# Patient Record
Sex: Female | Born: 1965
Health system: Southern US, Community
[De-identification: ages and names within clinical notes are randomized; demographics above are authoritative.]

## PROBLEM LIST (undated history)

## (undated) DIAGNOSIS — G4730 Sleep apnea, unspecified: Secondary | ICD-10-CM

## (undated) DIAGNOSIS — U099 Post covid-19 condition, unspecified: Secondary | ICD-10-CM

## (undated) DIAGNOSIS — J302 Other seasonal allergic rhinitis: Secondary | ICD-10-CM

## (undated) DIAGNOSIS — R252 Cramp and spasm: Secondary | ICD-10-CM

## (undated) HISTORY — PX: CHOLECYSTECTOMY: SHX55

## (undated) HISTORY — DX: Other seasonal allergic rhinitis: J30.2

## (undated) HISTORY — DX: Cramp and spasm: R25.2

## (undated) HISTORY — DX: Sleep apnea, unspecified: G47.30

## (undated) HISTORY — DX: Post covid-19 condition, unspecified: U09.9

---

## 1997-11-01 ENCOUNTER — Other Ambulatory Visit: Admission: RE | Admit: 1997-11-01 | Discharge: 1997-11-01 | Payer: Self-pay | Admitting: Obstetrics and Gynecology

## 1998-10-07 ENCOUNTER — Other Ambulatory Visit: Admission: RE | Admit: 1998-10-07 | Discharge: 1998-10-07 | Payer: Self-pay | Admitting: Obstetrics and Gynecology

## 2001-08-11 ENCOUNTER — Encounter: Admission: RE | Admit: 2001-08-11 | Discharge: 2001-08-11 | Payer: Self-pay | Admitting: Obstetrics and Gynecology

## 2001-08-11 ENCOUNTER — Encounter: Payer: Self-pay | Admitting: Obstetrics and Gynecology

## 2001-09-08 ENCOUNTER — Encounter: Payer: Self-pay | Admitting: Obstetrics and Gynecology

## 2001-09-08 ENCOUNTER — Encounter: Admission: RE | Admit: 2001-09-08 | Discharge: 2001-09-08 | Payer: Self-pay | Admitting: Obstetrics and Gynecology

## 2001-11-08 ENCOUNTER — Encounter: Admission: RE | Admit: 2001-11-08 | Discharge: 2001-11-08 | Payer: Self-pay | Admitting: Obstetrics and Gynecology

## 2001-11-08 ENCOUNTER — Encounter: Payer: Self-pay | Admitting: Obstetrics and Gynecology

## 2002-08-10 ENCOUNTER — Other Ambulatory Visit: Admission: RE | Admit: 2002-08-10 | Discharge: 2002-08-10 | Payer: Self-pay | Admitting: Obstetrics and Gynecology

## 2003-08-14 ENCOUNTER — Other Ambulatory Visit: Admission: RE | Admit: 2003-08-14 | Discharge: 2003-08-14 | Payer: Self-pay | Admitting: Obstetrics and Gynecology

## 2004-12-19 ENCOUNTER — Ambulatory Visit (HOSPITAL_COMMUNITY): Admission: RE | Admit: 2004-12-19 | Discharge: 2004-12-19 | Payer: Self-pay | Admitting: Family Medicine

## 2004-12-30 ENCOUNTER — Observation Stay (HOSPITAL_COMMUNITY): Admission: RE | Admit: 2004-12-30 | Discharge: 2004-12-31 | Payer: Self-pay | Admitting: General Surgery

## 2005-03-09 ENCOUNTER — Other Ambulatory Visit: Admission: RE | Admit: 2005-03-09 | Discharge: 2005-03-09 | Payer: Self-pay | Admitting: Obstetrics and Gynecology

## 2005-05-08 ENCOUNTER — Ambulatory Visit (HOSPITAL_COMMUNITY): Admission: RE | Admit: 2005-05-08 | Discharge: 2005-05-08 | Payer: Self-pay | Admitting: Endocrinology

## 2006-11-11 ENCOUNTER — Other Ambulatory Visit: Admission: RE | Admit: 2006-11-11 | Discharge: 2006-11-11 | Payer: Self-pay | Admitting: Obstetrics and Gynecology

## 2007-05-23 ENCOUNTER — Encounter: Admission: RE | Admit: 2007-05-23 | Discharge: 2007-05-23 | Payer: Self-pay | Admitting: Endocrinology

## 2007-06-20 ENCOUNTER — Encounter: Admission: RE | Admit: 2007-06-20 | Discharge: 2007-06-20 | Payer: Self-pay | Admitting: Obstetrics and Gynecology

## 2007-11-18 ENCOUNTER — Other Ambulatory Visit: Admission: RE | Admit: 2007-11-18 | Discharge: 2007-11-18 | Payer: Self-pay | Admitting: Obstetrics and Gynecology

## 2008-06-27 ENCOUNTER — Encounter: Admission: RE | Admit: 2008-06-27 | Discharge: 2008-06-27 | Payer: Self-pay | Admitting: Obstetrics and Gynecology

## 2008-11-23 ENCOUNTER — Other Ambulatory Visit: Admission: RE | Admit: 2008-11-23 | Discharge: 2008-11-23 | Payer: Self-pay | Admitting: Obstetrics and Gynecology

## 2009-01-02 ENCOUNTER — Ambulatory Visit (HOSPITAL_COMMUNITY): Admission: RE | Admit: 2009-01-02 | Discharge: 2009-01-02 | Payer: Self-pay | Admitting: Endocrinology

## 2009-03-13 ENCOUNTER — Ambulatory Visit (HOSPITAL_COMMUNITY): Admission: RE | Admit: 2009-03-13 | Discharge: 2009-03-13 | Payer: Self-pay | Admitting: Gastroenterology

## 2009-07-03 ENCOUNTER — Encounter: Admission: RE | Admit: 2009-07-03 | Discharge: 2009-07-03 | Payer: Self-pay | Admitting: Obstetrics and Gynecology

## 2009-07-10 ENCOUNTER — Encounter: Admission: RE | Admit: 2009-07-10 | Discharge: 2009-07-10 | Payer: Self-pay | Admitting: Obstetrics and Gynecology

## 2009-12-24 ENCOUNTER — Other Ambulatory Visit: Admission: RE | Admit: 2009-12-24 | Discharge: 2009-12-24 | Payer: Self-pay | Admitting: Obstetrics and Gynecology

## 2010-01-10 ENCOUNTER — Encounter: Admission: RE | Admit: 2010-01-10 | Discharge: 2010-01-10 | Payer: Self-pay | Admitting: Obstetrics and Gynecology

## 2010-07-04 ENCOUNTER — Encounter
Admission: RE | Admit: 2010-07-04 | Discharge: 2010-07-04 | Payer: Self-pay | Source: Home / Self Care | Attending: Obstetrics and Gynecology | Admitting: Obstetrics and Gynecology

## 2010-08-11 ENCOUNTER — Encounter: Payer: Self-pay | Admitting: Endocrinology

## 2010-12-02 NOTE — Op Note (Signed)
NAMETOLULOPE, PINKETT             ACCOUNT NO.:  [id number]   MEDICAL RECORD NO.:  [id number]          PATIENT TYPE:  AMB   LOCATION:  ENDO                         FACILITY:  [facility]   PHYSICIAN:  James L. Malon Kindle., M.D.DATE OF BIRTH:  08-Oct-1965   DATE OF PROCEDURE:  03/13/2009  DATE OF DISCHARGE:                               OPERATIVE REPORT   PROCEDURE:  Colonoscopy.   MEDICATIONS:  Fentanyl 100 mcg, Versed 8 mg IV.   INDICATIONS:  Rectal bleeding.   DESCRIPTION OF PROCEDURE:  Procedure has been explained to the patient  and consent was obtained.  In the left lateral decubitus position a  Pentax pediatric scope was inserted and advanced.  The prep was  excellent.  We were able to reach the cecum without difficulty with the  ileocecal valve and appendiceal orifice were seen.  The scope was  withdrawn and the descending colon, transverse, descending, and sigmoid  colon were seen well.  No polyps, diverticula or any other lesions were  seen.  The scope was withdrawn in the rectum and on the retroflex view  the patient was seen to have internal hemorrhoids.  No other  abnormalities were seen in the rectum.  The scope was withdrawn.  The  patient tolerated the procedure well.   ASSESSMENT:  Rectal bleeding probably due to internal hemorrhoids.  No  other findings were seen.  I gave the patient hemorrhoid instructions  and recommend routine screening followup.  Colonoscopy will be repeated  10 years.  Will give hemorrhoid instructions.           ______________________________  Llana Aliment Malon Kindle., M.D.     Waldron Session  D:  03/13/2009  T:  03/13/2009  Job:  [id number]   cc:   Artist Pais, M.D.  Fax: [phone]

## 2010-12-05 NOTE — Op Note (Signed)
[name], [name]             ACCOUNT NO.:  [id number]   MEDICAL RECORD NO.:  [id number]          PATIENT TYPE:  AMB   LOCATION:  DAY                           FACILITY:  [facility]   PHYSICIAN:  Jerolyn Shin C. Katrinka Blazing, M.D.   DATE OF BIRTH:  09-Mar-1966   DATE OF PROCEDURE:  12/30/2004  DATE OF DISCHARGE:                                 OPERATIVE REPORT   PREOPERATIVE DIAGNOSIS:  Cholelithiasis, cholecystitis.   POSTOPERATIVE DIAGNOSIS:  Cholelithiasis, cholecystitis.   PROCEDURE:  Laparoscopic cholecystectomy.   SURGEON:  Dr. Katrinka Blazing.   DESCRIPTION:  Under general endotracheal anesthesia, the patient's abdomen  was prepped and draped in a sterile field.  A supraumbilical incision was  made, and Veress needle was inserted uneventfully. Abdomen was insufflated  with 2 liters of CO2. Using a Visiport guide, a 10-mm port was placed.  Laparoscope was placed. 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 Under videoscopic  guidance, a 10-mm port and two 5-mm ports were placed in the right costal  area. The gallbladder was grasped and positioned. Cystic duct was dissected,  clipped with 5 clips and divided. The cystic artery 2 small branches. Each  was clipped with 3 clips and divided closed to the gallbladder. Gallbladder  was separated from the intrahepatic bed using electrocautery. The  gallbladder was placed in an EndoCatch device and retrieved. Irrigation was  carried out. There was minimal bleeding from the bed. The irrigating fluid  returned clear. CO2 was allowed to escape from the abdomen, and the ports  were removed. The incisions were closed using 0 Dexon on the fascia and  staples on the skin. The patient was awakened from anesthesia uneventfully,  transferred to a bed, and taken to the post anesthetic care unit for further  monitoring.       LCS/MEDQ  D:  12/30/2004  T:  12/30/2004  Job:  [id number]

## 2010-12-05 NOTE — H&P (Signed)
NAMEKATORA, FINI             ACCOUNT NO.:  [id number]   MEDICAL RECORD NO.:  [id number]          PATIENT TYPE:  AMB   LOCATION:  DAY                           FACILITY:  [facility]   PHYSICIAN:  Jerolyn Shin C. Katrinka Blazing, M.D.   DATE OF BIRTH:  November 09, 1965   DATE OF ADMISSION:  DATE OF DISCHARGE:  LH                                HISTORY & PHYSICAL   A 45 year old female with a history of pain in the right upper quadrant  radiating through to her back.  She has no difficulty with meals.  She  denies nausea or vomiting.  She has not had diarrhea.  Gallbladder  ultrasound revealed a thickened gallbladder with multiple small stones.  Patient is severely symptomatic and is scheduled for cholecystectomy.   PAST MEDICAL HISTORY:  She has no major medical illnesses.  Takes no  medications.  The only surgery is a C-section.   She has no allergies.   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  She  does not drink, smoke, or use drugs.  She is married.   PHYSICAL EXAMINATION:  VITAL SIGNS:  Blood pressure 116/78, pulse 72,  respirations 20, weight 147 pounds.  HEENT:  Unremarkable.  NECK:  Supple.  No JVD, bruits, adenopathy, or thyromegaly.  LUNGS:  Clear to auscultation.  HEART:  Regular rate and rhythm without murmur, rub or gallop.  ABDOMEN:  Mild right upper quadrant tenderness and epigastric tenderness.  EXTREMITIES:  No clubbing, cyanosis or edema.  NEUROLOGIC:  No focal motor, sensory, or cerebellar deficit.   IMPRESSION:  Cholelithiasis with cholecystitis.   PLAN:  Cholecystectomy.       LCS/MEDQ  D:  12/29/2004  T:  12/29/2004  Job:  [id number]

## 2011-06-15 ENCOUNTER — Other Ambulatory Visit: Payer: Self-pay | Admitting: Obstetrics and Gynecology

## 2011-06-15 DIAGNOSIS — Z1231 Encounter for screening mammogram for malignant neoplasm of breast: Secondary | ICD-10-CM

## 2011-07-02 ENCOUNTER — Other Ambulatory Visit (HOSPITAL_COMMUNITY)
Admission: RE | Admit: 2011-07-02 | Discharge: 2011-07-02 | Disposition: A | Discharge status: Home / Self Care | Payer: 59 | Source: Ambulatory Visit | Attending: Obstetrics and Gynecology | Admitting: Obstetrics and Gynecology

## 2011-07-02 ENCOUNTER — Other Ambulatory Visit: Payer: Self-pay | Admitting: Obstetrics and Gynecology

## 2011-07-02 DIAGNOSIS — Z01419 Encounter for gynecological examination (general) (routine) without abnormal findings: Secondary | ICD-10-CM | POA: Insufficient documentation

## 2011-07-16 ENCOUNTER — Ambulatory Visit
Admission: RE | Admit: 2011-07-16 | Discharge: 2011-07-16 | Disposition: A | Discharge status: Home / Self Care | Payer: 59 | Source: Ambulatory Visit | Attending: Obstetrics and Gynecology | Admitting: Obstetrics and Gynecology

## 2011-07-16 DIAGNOSIS — Z1231 Encounter for screening mammogram for malignant neoplasm of breast: Secondary | ICD-10-CM

## 2012-06-03 ENCOUNTER — Other Ambulatory Visit: Payer: Self-pay | Admitting: Obstetrics and Gynecology

## 2012-06-03 DIAGNOSIS — Z1231 Encounter for screening mammogram for malignant neoplasm of breast: Secondary | ICD-10-CM

## 2012-07-08 ENCOUNTER — Other Ambulatory Visit (HOSPITAL_COMMUNITY)
Admission: RE | Admit: 2012-07-08 | Discharge: 2012-07-08 | Disposition: A | Discharge status: Home / Self Care | Payer: 59 | Source: Ambulatory Visit | Attending: Obstetrics and Gynecology | Admitting: Obstetrics and Gynecology

## 2012-07-08 ENCOUNTER — Other Ambulatory Visit: Payer: Self-pay | Admitting: Obstetrics and Gynecology

## 2012-07-08 DIAGNOSIS — Z01419 Encounter for gynecological examination (general) (routine) without abnormal findings: Secondary | ICD-10-CM | POA: Insufficient documentation

## 2012-07-18 ENCOUNTER — Ambulatory Visit: Payer: 59

## 2012-08-03 ENCOUNTER — Other Ambulatory Visit (HOSPITAL_COMMUNITY): Payer: Self-pay | Admitting: Endocrinology

## 2012-08-03 DIAGNOSIS — E049 Nontoxic goiter, unspecified: Secondary | ICD-10-CM

## 2012-08-05 ENCOUNTER — Ambulatory Visit (HOSPITAL_COMMUNITY): Payer: 59

## 2012-08-05 ENCOUNTER — Ambulatory Visit (HOSPITAL_COMMUNITY)
Admission: RE | Admit: 2012-08-05 | Discharge: 2012-08-05 | Disposition: A | Discharge status: Home / Self Care | Payer: 59 | Source: Ambulatory Visit | Attending: Endocrinology | Admitting: Endocrinology

## 2012-08-05 DIAGNOSIS — E049 Nontoxic goiter, unspecified: Secondary | ICD-10-CM

## 2012-08-05 DIAGNOSIS — E041 Nontoxic single thyroid nodule: Secondary | ICD-10-CM | POA: Insufficient documentation

## 2012-09-02 ENCOUNTER — Ambulatory Visit: Payer: 59

## 2012-09-13 ENCOUNTER — Ambulatory Visit
Admission: RE | Admit: 2012-09-13 | Discharge: 2012-09-13 | Disposition: A | Discharge status: Home / Self Care | Payer: 59 | Source: Ambulatory Visit | Attending: Obstetrics and Gynecology | Admitting: Obstetrics and Gynecology

## 2012-09-13 DIAGNOSIS — Z1231 Encounter for screening mammogram for malignant neoplasm of breast: Secondary | ICD-10-CM

## 2012-09-14 ENCOUNTER — Other Ambulatory Visit: Payer: Self-pay | Admitting: Obstetrics and Gynecology

## 2012-09-14 DIAGNOSIS — R928 Other abnormal and inconclusive findings on diagnostic imaging of breast: Secondary | ICD-10-CM

## 2012-09-26 ENCOUNTER — Ambulatory Visit
Admission: RE | Admit: 2012-09-26 | Discharge: 2012-09-26 | Disposition: A | Discharge status: Home / Self Care | Payer: 59 | Source: Ambulatory Visit | Attending: Obstetrics and Gynecology | Admitting: Obstetrics and Gynecology

## 2012-09-26 ENCOUNTER — Other Ambulatory Visit: Payer: Self-pay | Admitting: Obstetrics and Gynecology

## 2012-09-26 DIAGNOSIS — R928 Other abnormal and inconclusive findings on diagnostic imaging of breast: Secondary | ICD-10-CM

## 2013-08-03 ENCOUNTER — Other Ambulatory Visit: Payer: Self-pay | Admitting: Endocrinology

## 2013-08-03 DIAGNOSIS — E049 Nontoxic goiter, unspecified: Secondary | ICD-10-CM

## 2013-08-07 ENCOUNTER — Ambulatory Visit
Admission: RE | Admit: 2013-08-07 | Discharge: 2013-08-07 | Disposition: A | Discharge status: Home / Self Care | Payer: 59 | Source: Ambulatory Visit | Attending: Endocrinology | Admitting: Endocrinology

## 2013-08-07 DIAGNOSIS — E049 Nontoxic goiter, unspecified: Secondary | ICD-10-CM

## 2013-12-07 ENCOUNTER — Emergency Department (HOSPITAL_COMMUNITY)
Admission: EM | Admit: 2013-12-07 | Discharge: 2013-12-07 | Disposition: A | Discharge status: Home / Self Care | Payer: 59 | Source: Home / Self Care | Attending: Family Medicine | Admitting: Family Medicine

## 2013-12-07 ENCOUNTER — Emergency Department (INDEPENDENT_AMBULATORY_CARE_PROVIDER_SITE_OTHER): Payer: 59

## 2013-12-07 ENCOUNTER — Encounter (HOSPITAL_COMMUNITY): Payer: Self-pay | Admitting: Emergency Medicine

## 2013-12-07 DIAGNOSIS — M25579 Pain in unspecified ankle and joints of unspecified foot: Secondary | ICD-10-CM

## 2013-12-07 NOTE — ED Provider Notes (Signed)
Alexandra LowensteinRachel A [name] is a 48 y.o. female who presents to Urgent Care today for left ankle pain. Patient suffered an inversion injury about 2 months ago. She had initial pain and swelling but recovered iron. She notes continued lateral ankle pain worse with activity better with rest. She recently started a walking program and he notes the pain is preventing her from exercising. She denies any radiating pain weakness or numbness. No locking catching or giving way. She has tried some over-the-counter medications which have not helped much.   History reviewed. No pertinent past medical history. History  Substance Use Topics  . Smoking status: Not on file  . Smokeless tobacco: Not on file  . Alcohol Use: Not on file   ROS as above Medications: No current facility-administered medications for this encounter.   No current outpatient prescriptions on file.    Exam:  BP 105/74  Pulse 65  Temp(Src) 98.5 F (36.9 C) (Oral)  Resp 16  SpO2 100%  LMP 11/22/2013 Gen: Well NAD Left ankle: Normal-appearing with mild swelling laterally. Nontender. Stable ligamentous exam. Strength is intact, sensation capillary refill  and pulses are intact distally.  Limited musculoskeletal ultrasound of the lateral ankle.: Peroneal tendons are normal appearing with dynamic views. No subluxations are noted. No significant bony abnormalities   No results found for this or any previous visit (from the past 24 hour(s)). Dg Ankle Complete Left  12/07/2013   CLINICAL DATA:  Pain post trauma  EXAM: LEFT ANKLE COMPLETE - 3+ VIEW  COMPARISON:  None.  FINDINGS: Frontal, oblique, and lateral views were obtained. There is a tiny calcification just inferior to the lateral malleolus, suspicious for a tiny avulsion. There is slight swelling in this area laterally. No other evidence suggesting fracture. Ankle mortise appears intact. There is no appreciable joint effusion. There are spurs arising from the posterior and inferior  calcaneus.  IMPRESSION: Evidence of tiny avulsion arising from lateral malleolus. Mild swelling laterally. Calcaneal spurs are present. Ankle mortise appears intact.   Electronically Signed   By: Bretta BangWilliam  Woodruff M.D.   On: 12/07/2013 17:25    Assessment and Plan: 48 y.o. female with lateral ankle pain status post sprain. Tiny old avulsion fracture. Unsure of this is a component. Plan to treat with ASO brace NSAIDs and physical therapy. Follow up with sports medicine as needed.  Discussed warning signs or symptoms. Please see discharge instructions. Patient expresses understanding.    Rodolph BongEvan S Corey, MD 12/07/13 [phone]

## 2013-12-07 NOTE — Discharge Instructions (Signed)
Thank you for coming in today. Follow up with sports medicine as needed.   Chronic Ankle Instability with Rehab Chronic ankle instability is characterized by instability of the ankle for a prolonged period of time. There are two types of ankle instability.   A functionally unstable ankle is one that gives way; however, it may or may not be loose.  A mechanically unstable ankle is one that is loose due to a problem with the ligaments. However, not all loose ankles are unstable or give way. SYMPTOMS   Recurrent ankle pain and giving way of the ankle.  Difficulty running on uneven surfaces, jumping, or changing directions while running (cutting).  Pain, tenderness, swelling, and bruising at the site of injury.  Weakness or looseness in the ankle joint.  Occasionally, impaired ability to walk soon after injury. CAUSES   Ankle instability is most commonly caused by a previous ankle injury that did not completely heal.  Ankle instability may also be caused by stress imposed from either side of the ankle joint that can temporarily force or pry the ankle bone (talus) out of its normal alignment. The ligaments that hold the joint in place are stretched and torn. RISK INCREASES WITH:  Previous ankle injury.  You were born with (congenital) joint looseness.  Too-rapid return to activity after previous ankle sprain.  Activities in which the foot may land sideways while running, walking, and jumping (basketball, volleyball, or soccer) or walking or running on uneven or rough surfaces.  Inadequate ankle support during athletics.  Poor strength and flexibility.  Poor balance skills. PREVENTION  Warm up and stretch properly before activity.  Maintain physical fitness:  Ankle and leg flexibility, muscle strength, and endurance.  Balanced training activities.  Cardiovascular fitness.  Learn and use proper technique during sports and have a coach correct improper  technique.  Taping, protective strapping, bracing, or high-top tennis shoes may be used. Initially, tape is best; however, it loses most of its support function within 10 to 15 minutes.  Wear proper protective shoes (high-top shoes with taping or bracing).  Provide the ankle with support during sports and practice activities for 12 months following injury.  Complete rehabilitation after initial injury. PROGNOSIS  If treated properly, ankle instability normally resolves with non-surgical treatment. However, for certain cases of mechanical instability surgery is necessary. RELATED COMPLICATIONS   Frequent recurrence of symptoms is possible. Following rehabilitation guidelines correctly decreases the frequency of recurrence and optimizes healing time.  Injury to other structures (bone, cartilage, or tendon).  Chronically unstable or arthritic ankle joint.  Complications of surgery including infection, bleeding, injury to nerves, continued giving way, ankle stiffness, and ankle weakness. TREATMENT Treatment initially involves ice, medication, and compression bandages are used to help reduce pain and inflammation, It may be necessary to immobilize the joint for a period of time to allow for healing. Strengthening and stretching exercises are recommended after immobilization to help regain strength and flexibility. These exercises may be completed at home or with a therapist. Some individuals find placing a heel wedge in the shoe, taping or bracing, and wearing high-top shoes helpful. If symptoms last for longer than 3 months, despite treatment, then surgery may be recommended. HEAT AND COLD  Cold treatment (icing) relieves pain and reduces inflammation. Cold treatment should be applied for 10 to 15 minutes every 2 to 3 hours for inflammation and pain and immediately after any activity that aggravates your symptoms. Use ice packs or an ice massage.  Heat treatment may  be used prior to performing  the stretching and strengthening activities prescribed by your caregiver, physical therapist, or athletic trainer. Use a heat pack or a warm soak. MEDICATION   There are no specific medications to improve the stability of your ankle.  If pain medication is necessary, then nonsteroidal anti-inflammatory medications, such as aspirin and ibuprofen, or other minor pain relievers, such as acetaminophen, are often recommended.  Do not take pain medication within 7 days before surgery.  Prescription pain relievers may be prescribed if deemed necessary by your caregiver. Use only as directed and only as much as you need.  Ointments applied to the skin may be helpful. SEEK MEDICAL CARE IF:   Pain, swelling, or bruising worsens despite treatment.  You develop locking or catching in the ankle.  You have pain, numbness, or coldness in the foot.  You develop giving way of the ankle which persists after 3 to 6 months of rehabilitation. EXERCISES  RANGE OF MOTION AND STRETCHING EXERCISES - Ankle Instability, Chronic, Non-Surgical Intervention Since ankles demonstrate instability when they have too much motion throughout the joints, range of motion and stretching exercises are not helpful and can even be harmful. Only complete range of motion and stretching exercises for your ankle if instructed by your physician, physical therapist or athletic trainer. An effective rehabilitation program for unstable ankles will include mostly strengthening and balance exercises. STRENGTHENING EXERCISES - Ankle Instability, Chronic, Non-Surgical Intervention  These exercises may help you when beginning to rehabilitate your injury. They may resolve your symptoms with or without further involvement from your physician, physical therapist or athletic trainer. While completing these exercises, remember:  Muscles can gain both the endurance and the strength needed for everyday activities through controlled  exercises.  Complete these exercises as instructed by your physician, physical therapist or athletic trainer. Progress the resistance and repetitions only as guided.  You may experience muscle soreness or fatigue, but the pain or discomfort you are trying to eliminate should never worsen during these exercises. If this pain does worsen, stop and make certain you are following the directions exactly. If the pain is still present after adjustments, discontinue the exercise until you can discuss the trouble with your clinician. STRENGTH - Dorsiflexors  Secure a rubber exercise band/tubing to a fixed object (table, pole) and loop the other end around your right / left foot.  Sit on the floor facing the fixed object. The band/tubing should be slightly tense when your foot is relaxed.  Slowly draw your foot back toward you using your ankle and toes.  Hold this position for __________ seconds. Slowly release the tension in the band and return your foot to the starting position. Repeat __________ times. Complete this exercise __________ times per day.  STRENGTH - Plantar-flexors  Sit with your right / left leg extended. Holding onto both ends of a rubber exercise band/tubing, loop it around the ball of your foot. Keep a slight tension in the band.  Slowly push your toes away from you, pointing them downward.  Hold this position for __________ seconds. Return slowly, controlling the tension in the band/tubing. Repeat __________ times. Complete this exercise __________ times per day.  STRENGTH - Plantar-flexors, Standing   Stand with your feet shoulder width apart. Steady yourself with a wall or table using as little support as needed.  Keeping your weight evenly spread over the width of your feet, rise up on your toes.*  Hold this position for __________ seconds. Repeat __________ times. Complete this  exercise __________ times per day.  *If this is too easy, shift your weight toward your right  / left leg until you feel challenged. Ultimately, you may be asked to do this exercise with your right / left foot only. STRENGTH - Ankle Eversion  Secure one end of a rubber exercise band/tubing to a fixed object (table, pole). Loop the other end around your foot just before your toes.  Place your fists between your knees. This will focus your strengthening at your ankle.  Drawing the band/tubing across your opposite foot, slowly, pull your little toe out and up. Make sure the band/tubing is positioned to resist the entire motion.  Hold this position for __________ seconds.  Have your muscles resist the band/tubing as it slowly pulls your foot back to the starting position. Repeat __________ times. Complete this exercise __________ times per day.  STRENGTH - Ankle Inversion  Secure one end of a rubber exercise band/tubing to a fixed object (table, pole). Loop the other end around your foot just before your toes.  Place your fists between your knees. This will focus your strengthening at your ankle.  Slowly, pull your big toe up and in, making sure the band/tubing is positioned to resist the entire motion.  Hold this position for __________ seconds.  Have your muscles resist the band/tubing as it slowly pulls your foot back to the starting position. Repeat __________ times. Complete this exercises __________ times per day.  STRENGTH - Towel Curls  Sit in a chair positioned on a non-carpeted surface.  Place your foot on a towel, keeping your heel on the floor.  Pull the towel toward your heel by only curling your toes. Keep your heel on the floor.  If instructed by your physician, physical therapist or athletic trainer, add weight to the end of the towel. Repeat __________ times. Complete this exercise __________ times per day. STRENGTH  Dorsiflexors and Plantar-flexors, Heel/toe Walking  Dorsiflexion: Walk on your heels only. Keep your toes as high as possible.  Repeat  __________ times. Complete __________ times per day.  Plantar flexion: Walk on your toes only. Keep your heels as high as possible.  Walk for ____________________ seconds/feet. Repeat __________ times. Complete __________ times per day.  BALANCE  Tandem Walking  Place your uninjured foot on a line 2-4 inches wide and at least 10 feet long.  Keeping your balance without using anything for extra support, place your right / left heel directly in front of your other foot.  Slowly raise your back foot up, lifting from the heel to the toes, and place it directly in front of the right / left foot.  Continue to walk along the line slowly. Walk for ____________________ feet. Repeat ____________________ times. Complete ____________________ times per day. BALANCE - Inversion/Eversion Use caution, these are advanced level exercises. Do not begin them until you are advised to do so.   Create a balance board using a sturdy board about 1  feet long and at 1-1  feet wide and a 1  inch diameter rod or pipe that is as long as the board's width. A copper pipe or a solid broomstick work well.  Stand on a non-carpeted surface near a countertop or wall. Step onto the board so that your feet are hip-width apart and equally straddle the rod/pipe.  Keeping your feet in place, complete these two exercises without shifting your upper body or hips:  Tip the board from side-to-side. Control the movement so the board does not  forcefully strike the ground. The board should silently tap the ground.  Tip the board side-to-side without striking the ground. Occasionally pause and maintain a steady position at various points.  Repeat the first two exercises, but use only your right / left foot. Place your right / left foot directly over the rod/pipe. Repeat __________ times. Complete this exercise __________ times a day. BALANCE - Plantar/Dorsi Flexion Use caution, these are advanced level exercises. Do not begin  them until you are advised to do so.  Create a balance board using a sturdy board about 1  feet long and at 1-1  feet wide and a 1  inch diameter rod or pipe that is as long as the board's width. A copper pipe or a solid broomstick work well.  Stand on a non-carpeted surface near a countertop or wall. Stand on the board so that the rod/pipe runs under the arches in your feet.  Keeping your feet in place, complete these two exercises without shifting your upper body or hips:  Tip the board from side-to-side. Control the movement so the board does not forcefully strike the ground. The board should silently tap the ground.  Tip the board side-to-side without striking the ground. Occasionally pause and maintain a steady position at various points.  Repeat the first two exercises, but use only your right / left foot. Stand in the center of the board. Repeat __________ times. Complete this exercise __________ times a day. STRENGTH  Plantar-flexors, Eccentric Note: This exercise can place a lot of stress on your foot and ankle. Please complete this exercise only if specifically instructed by your caregiver.   Place the balls of your feet on a step. With your hands, use only enough support from a wall or rail to keep your balance.  Keep your knees straight and rise up on your toes.  Slowly shift your weight entirely to your toes and pick up your opposite foot. Gently and with controlled movement, lower your weight through your right / left foot so that your heel drops below the level of the step. You will feel a slight stretch in the back of your calf at the ending position.  Use the healthy leg to help rise up onto the balls of both feet, then lower weight only on the right / left leg again. Build up to 15 repetitions. Then progress to 3 consecutive sets of 15 repetitions.*  After completing the above exercise, complete the same exercise with a slight knee bend (about 30 degrees). Again, build  up to 15 repetitions. Then progress to 3 consecutive sets of 15 repetitions.* Perform this exercise __________ times per day.  *When you easily complete 3 sets of 15, your physician, physical therapist or athletic trainer may advise you to add resistance by wearing a backpack filled with additional weight. Document Released: 02/04/2005 Document Revised: 09/28/2011 Document Reviewed: 10/18/2008 [facility] Patient Information 2014 [place], [place].

## 2013-12-07 NOTE — ED Notes (Signed)
C/o left ankle pain States she did fall a couple of months ago down some stairs No xray image done Did have a little swelling States she does work 12 hours; afterwards ankle does give her discomfort when she stands

## 2013-12-12 ENCOUNTER — Other Ambulatory Visit: Payer: Self-pay

## 2013-12-12 DIAGNOSIS — Z1231 Encounter for screening mammogram for malignant neoplasm of breast: Secondary | ICD-10-CM

## 2013-12-21 ENCOUNTER — Ambulatory Visit: Payer: PRIVATE HEALTH INSURANCE | Attending: Family Medicine

## 2013-12-21 DIAGNOSIS — M25579 Pain in unspecified ankle and joints of unspecified foot: Secondary | ICD-10-CM | POA: Diagnosis not present

## 2013-12-21 DIAGNOSIS — M25673 Stiffness of unspecified ankle, not elsewhere classified: Secondary | ICD-10-CM | POA: Diagnosis not present

## 2013-12-21 DIAGNOSIS — M25676 Stiffness of unspecified foot, not elsewhere classified: Secondary | ICD-10-CM | POA: Diagnosis not present

## 2013-12-21 DIAGNOSIS — IMO0001 Reserved for inherently not codable concepts without codable children: Secondary | ICD-10-CM | POA: Diagnosis not present

## 2013-12-22 ENCOUNTER — Ambulatory Visit
Admission: RE | Admit: 2013-12-22 | Discharge: 2013-12-22 | Disposition: A | Discharge status: Home / Self Care | Payer: 59 | Source: Ambulatory Visit

## 2013-12-22 DIAGNOSIS — Z1231 Encounter for screening mammogram for malignant neoplasm of breast: Secondary | ICD-10-CM

## 2014-01-02 ENCOUNTER — Ambulatory Visit: Payer: PRIVATE HEALTH INSURANCE | Admitting: Physical Therapy

## 2014-01-02 DIAGNOSIS — IMO0001 Reserved for inherently not codable concepts without codable children: Secondary | ICD-10-CM | POA: Diagnosis not present

## 2014-01-03 ENCOUNTER — Ambulatory Visit: Payer: PRIVATE HEALTH INSURANCE | Admitting: Physical Therapy

## 2014-01-03 DIAGNOSIS — IMO0001 Reserved for inherently not codable concepts without codable children: Secondary | ICD-10-CM | POA: Diagnosis not present

## 2014-01-08 ENCOUNTER — Ambulatory Visit: Payer: PRIVATE HEALTH INSURANCE | Admitting: Physical Therapy

## 2014-01-08 DIAGNOSIS — IMO0001 Reserved for inherently not codable concepts without codable children: Secondary | ICD-10-CM | POA: Diagnosis not present

## 2014-01-12 ENCOUNTER — Ambulatory Visit: Payer: PRIVATE HEALTH INSURANCE | Admitting: Physical Therapy

## 2014-01-15 ENCOUNTER — Ambulatory Visit: Payer: PRIVATE HEALTH INSURANCE | Admitting: Physical Therapy

## 2014-01-15 DIAGNOSIS — IMO0001 Reserved for inherently not codable concepts without codable children: Secondary | ICD-10-CM | POA: Diagnosis not present

## 2014-01-18 ENCOUNTER — Ambulatory Visit: Payer: PRIVATE HEALTH INSURANCE | Attending: Obstetrics and Gynecology | Admitting: Physical Therapy

## 2014-01-18 DIAGNOSIS — M25676 Stiffness of unspecified foot, not elsewhere classified: Secondary | ICD-10-CM | POA: Insufficient documentation

## 2014-01-18 DIAGNOSIS — IMO0001 Reserved for inherently not codable concepts without codable children: Secondary | ICD-10-CM | POA: Diagnosis present

## 2014-01-18 DIAGNOSIS — M25673 Stiffness of unspecified ankle, not elsewhere classified: Secondary | ICD-10-CM | POA: Diagnosis not present

## 2014-01-18 DIAGNOSIS — M25579 Pain in unspecified ankle and joints of unspecified foot: Secondary | ICD-10-CM | POA: Insufficient documentation

## 2014-01-22 ENCOUNTER — Ambulatory Visit: Payer: PRIVATE HEALTH INSURANCE

## 2014-01-22 DIAGNOSIS — IMO0001 Reserved for inherently not codable concepts without codable children: Secondary | ICD-10-CM | POA: Diagnosis not present

## 2014-01-23 ENCOUNTER — Ambulatory Visit: Payer: PRIVATE HEALTH INSURANCE | Attending: Obstetrics and Gynecology | Admitting: Physical Therapy

## 2014-01-23 DIAGNOSIS — M25579 Pain in unspecified ankle and joints of unspecified foot: Secondary | ICD-10-CM | POA: Diagnosis not present

## 2014-01-23 DIAGNOSIS — M25676 Stiffness of unspecified foot, not elsewhere classified: Secondary | ICD-10-CM | POA: Insufficient documentation

## 2014-01-23 DIAGNOSIS — IMO0001 Reserved for inherently not codable concepts without codable children: Secondary | ICD-10-CM | POA: Diagnosis not present

## 2014-01-23 DIAGNOSIS — M25673 Stiffness of unspecified ankle, not elsewhere classified: Secondary | ICD-10-CM | POA: Diagnosis not present

## 2014-01-24 ENCOUNTER — Encounter: Payer: 59 | Admitting: Physical Therapy

## 2014-01-24 ENCOUNTER — Encounter: Payer: Self-pay | Admitting: Physical Therapy

## 2014-01-29 ENCOUNTER — Ambulatory Visit: Payer: 59 | Attending: Obstetrics and Gynecology | Admitting: Rehabilitation

## 2014-01-29 DIAGNOSIS — M25579 Pain in unspecified ankle and joints of unspecified foot: Secondary | ICD-10-CM | POA: Diagnosis not present

## 2014-01-29 DIAGNOSIS — IMO0001 Reserved for inherently not codable concepts without codable children: Secondary | ICD-10-CM | POA: Diagnosis not present

## 2014-01-29 DIAGNOSIS — M25673 Stiffness of unspecified ankle, not elsewhere classified: Secondary | ICD-10-CM | POA: Insufficient documentation

## 2014-01-29 DIAGNOSIS — M25676 Stiffness of unspecified foot, not elsewhere classified: Secondary | ICD-10-CM | POA: Insufficient documentation

## 2014-01-30 ENCOUNTER — Encounter: Payer: Self-pay | Admitting: Physical Therapy

## 2014-02-02 ENCOUNTER — Ambulatory Visit: Payer: 59 | Admitting: Physical Therapy

## 2014-02-02 DIAGNOSIS — IMO0001 Reserved for inherently not codable concepts without codable children: Secondary | ICD-10-CM | POA: Diagnosis not present

## 2014-02-05 ENCOUNTER — Ambulatory Visit: Payer: 59 | Admitting: Rehabilitation

## 2014-02-06 ENCOUNTER — Ambulatory Visit: Payer: 59 | Admitting: Physical Therapy

## 2014-02-06 ENCOUNTER — Encounter: Payer: Self-pay | Admitting: Physical Therapy

## 2014-02-06 DIAGNOSIS — IMO0001 Reserved for inherently not codable concepts without codable children: Secondary | ICD-10-CM | POA: Diagnosis not present

## 2014-02-08 ENCOUNTER — Encounter: Payer: Self-pay | Admitting: Physical Therapy

## 2014-02-13 ENCOUNTER — Ambulatory Visit: Payer: 59 | Admitting: Physical Therapy

## 2014-02-13 DIAGNOSIS — IMO0001 Reserved for inherently not codable concepts without codable children: Secondary | ICD-10-CM | POA: Diagnosis not present

## 2014-02-15 ENCOUNTER — Ambulatory Visit: Payer: 59 | Admitting: Physical Therapy

## 2014-02-15 DIAGNOSIS — IMO0001 Reserved for inherently not codable concepts without codable children: Secondary | ICD-10-CM | POA: Diagnosis not present

## 2015-07-23 ENCOUNTER — Other Ambulatory Visit: Payer: Self-pay | Admitting: Endocrinology

## 2015-07-23 DIAGNOSIS — E049 Nontoxic goiter, unspecified: Secondary | ICD-10-CM

## 2015-07-29 ENCOUNTER — Other Ambulatory Visit: Payer: Self-pay

## 2015-08-05 ENCOUNTER — Ambulatory Visit
Admission: RE | Admit: 2015-08-05 | Discharge: 2015-08-05 | Disposition: A | Discharge status: Home / Self Care | Payer: 59 | Source: Ambulatory Visit | Attending: Endocrinology | Admitting: Endocrinology

## 2015-08-05 DIAGNOSIS — E049 Nontoxic goiter, unspecified: Secondary | ICD-10-CM

## 2016-03-12 ENCOUNTER — Ambulatory Visit: Payer: Managed Care, Other (non HMO) | Admitting: Podiatry

## 2016-04-23 ENCOUNTER — Encounter: Payer: Self-pay | Admitting: Podiatry

## 2016-04-24 ENCOUNTER — Encounter: Payer: Self-pay | Admitting: Podiatry

## 2016-10-05 ENCOUNTER — Other Ambulatory Visit (HOSPITAL_COMMUNITY)
Admission: RE | Admit: 2016-10-05 | Discharge: 2016-10-05 | Disposition: A | Discharge status: Home / Self Care | Payer: Managed Care, Other (non HMO) | Source: Ambulatory Visit | Attending: Obstetrics and Gynecology | Admitting: Obstetrics and Gynecology

## 2016-10-05 ENCOUNTER — Other Ambulatory Visit: Payer: Self-pay | Admitting: Obstetrics and Gynecology

## 2016-10-05 DIAGNOSIS — Z1151 Encounter for screening for human papillomavirus (HPV): Secondary | ICD-10-CM | POA: Diagnosis not present

## 2016-10-05 DIAGNOSIS — Z113 Encounter for screening for infections with a predominantly sexual mode of transmission: Secondary | ICD-10-CM | POA: Diagnosis present

## 2016-10-05 DIAGNOSIS — Z01419 Encounter for gynecological examination (general) (routine) without abnormal findings: Secondary | ICD-10-CM | POA: Insufficient documentation

## 2016-10-08 LAB — CYTOLOGY - PAP
Chlamydia: NEGATIVE
DIAGNOSIS: NEGATIVE
HPV (WINDOPATH): NOT DETECTED
NEISSERIA GONORRHEA: NEGATIVE

## 2016-10-29 ENCOUNTER — Encounter (HOSPITAL_COMMUNITY): Payer: Self-pay | Admitting: Emergency Medicine

## 2016-10-29 ENCOUNTER — Ambulatory Visit (HOSPITAL_COMMUNITY)
Admission: EM | Admit: 2016-10-29 | Discharge: 2016-10-29 | Disposition: A | Discharge status: Home / Self Care | Payer: Managed Care, Other (non HMO) | Attending: Family Medicine | Admitting: Family Medicine

## 2016-10-29 DIAGNOSIS — R6889 Other general symptoms and signs: Secondary | ICD-10-CM

## 2016-10-29 LAB — POCT URINALYSIS DIP (DEVICE)
Bilirubin Urine: NEGATIVE
Glucose, UA: NEGATIVE mg/dL
KETONES UR: NEGATIVE mg/dL
Leukocytes, UA: NEGATIVE
Nitrite: NEGATIVE
PH: 8.5 — AB (ref 5.0–8.0)
Protein, ur: 30 mg/dL — AB
Specific Gravity, Urine: 1.015 (ref 1.005–1.030)
UROBILINOGEN UA: 1 mg/dL (ref 0.0–1.0)

## 2016-10-29 MED ORDER — ACETAMINOPHEN 325 MG PO TABS
650.0000 mg | ORAL_TABLET | Freq: Once | ORAL | Status: AC
  Administered 2016-10-29: 650 mg via ORAL

## 2016-10-29 MED ORDER — ACETAMINOPHEN 325 MG PO TABS
ORAL_TABLET | ORAL | Status: AC
  Filled 2016-10-29: qty 2

## 2016-10-29 MED ORDER — OSELTAMIVIR PHOSPHATE 6 MG/ML PO SUSR: 75.0000 mg | Freq: Two times a day (BID) | ORAL | 0 refills | Status: DC

## 2016-10-29 NOTE — ED Provider Notes (Signed)
[facility]    CSN: [id number] Arrival date & time: 10/29/16  1345     History   Chief Complaint Chief Complaint  Patient presents with  . Weakness    HPI Alexandra [name] is a 51 y.o. female.   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 She comes in with the abrupt onset of fever, chills, malaise, fatigue starting at 10 AM this morning.  He denies cough, sore throat, abdominal pain, shortness of breath,nausea or vomiting.      History reviewed. No pertinent past medical history.  There are no active problems to display for this patient.   History reviewed. No pertinent surgical history.  OB History    No data available       Home Medications    Prior to Admission medications   Medication Sig Start Date End Date Taking? Authorizing Provider  oseltamivir (TAMIFLU) 6 MG/ML SUSR suspension Take 12.5 mLs (75 mg total) by mouth 2 (two) times daily. 10/29/16   Elvina Sidle, MD    Family History No family history on file.  Social History Social History  Substance Use Topics  . Smoking status: Never Smoker  . Smokeless tobacco: Never Used  . Alcohol use Yes     Allergies   Patient has no known allergies.   Review of Systems Review of Systems  Constitutional: Positive for fever.  Musculoskeletal: Positive for myalgias.  All other systems reviewed and are negative.    Physical Exam Triage Vital Signs ED Triage Vitals  Enc Vitals Group     BP 10/29/16 1359 96/61     Pulse Rate 10/29/16 1359 100     Resp 10/29/16 1359 18     Temp 10/29/16 1359 (!) 102.8 F (39.3 C)     Temp Source 10/29/16 1359 Oral     SpO2 10/29/16 1359 100 %     Weight --      Height --      Head Circumference --      Peak Flow --      Pain Score 10/29/16 1401 7     Pain Loc --      Pain Edu? --      Excl. in GC? --    No data found.   Updated Vital Signs BP 96/61 (BP Location: Left Arm)   Pulse 100   Temp (!) 102.8 F (39.3  C) (Oral)   Resp 18   LMP 10/29/2016   SpO2 100%    Physical Exam  Constitutional: She is oriented to person, place, and time. She appears well-developed and well-nourished.  HENT:  Right Ear: External ear normal.  Left Ear: External ear normal.  Mouth/Throat: Oropharynx is clear and moist.  Eyes: Conjunctivae are normal. Pupils are equal, round, and reactive to light.  Neck: Normal range of motion. Neck supple.  Cardiovascular: Normal rate, regular rhythm and normal heart sounds.   Pulmonary/Chest: Effort normal and breath sounds normal.  Abdominal: Soft. There is no tenderness.  Musculoskeletal: Normal range of motion.  Neurological: She is alert and oriented to person, place, and time.  Skin: Skin is warm and dry.  Nursing note and vitals reviewed.    UC Treatments / Results  Labs (all labs ordered are listed, but only abnormal results are displayed) Labs Reviewed  POCT URINALYSIS DIP (DEVICE) - Abnormal; Notable for the following:       Result Value   Hgb urine dipstick LARGE (*)  pH 8.5 (*)    Protein, ur 30 (*)    All other components within normal limits    EKG  EKG Interpretation None       Radiology No results found.  Procedures Procedures (including critical care time)  Medications Ordered in UC Medications  acetaminophen (TYLENOL) tablet 650 mg (650 mg Oral Given 10/29/16 1426)     Initial Impression / Assessment and Plan / UC Course  I have reviewed the triage vital signs and the nursing notes.  Pertinent labs & imaging results that were available during my care of the patient were reviewed by me and considered in my medical decision making (see chart for details).     Final Clinical Impressions(s) / UC Diagnoses   Final diagnoses:  Flu-like symptoms    New Prescriptions New Prescriptions   OSELTAMIVIR (TAMIFLU) 6 MG/ML SUSR SUSPENSION    Take 12.5 mLs (75 mg total) by mouth 2 (two) times daily.     Elvina Sidle, MD 10/29/16  1431

## 2016-10-29 NOTE — ED Triage Notes (Signed)
Pt c/o feeling weak onset last night associated w/fevers, dizziness, BA, chills, cough  Denies fevers  Has not had any meds today  Reports she works 3rd shift and felt fine last night  A&O x4... NAD

## 2016-10-29 NOTE — Discharge Instructions (Signed)
You have symptoms consistent with influenza. Your urine is negative for infection.

## 2016-11-18 ENCOUNTER — Other Ambulatory Visit: Payer: Self-pay | Admitting: Obstetrics and Gynecology

## 2016-11-19 ENCOUNTER — Telehealth: Payer: Self-pay | Admitting: Family Medicine

## 2016-11-19 NOTE — Telephone Encounter (Signed)
Patient calling bc she would like to make appt w/Dr. Lodema HongSimpson.  It has been more than three years since she was seen and since then she was just seeing OBGYN, but would like to start back with Dr. Lodema HongSimpson since she is done having kids.  I offered appt with Dr. Delton SeeNelson, but she is really not interested in anyone but Dr. Lodema HongSimpson.  Please advise.

## 2016-11-20 NOTE — Telephone Encounter (Signed)
Wanted to run this by you to decide. Declined Dr Delton SeeNelson

## 2016-11-21 NOTE — Telephone Encounter (Signed)
I don't see where I have ever seen this patient so will not be a re establishment, so no

## 2016-11-23 NOTE — Telephone Encounter (Signed)
Dr Lodema Hongsimpson said no. Will need to establish with dr Delton Seenelson or another pcp accepting new patients

## 2016-11-24 NOTE — Telephone Encounter (Signed)
Left message on voice mail for patient to call me regarding appointment.  I will let her know that she needs appt with Dr. Delton SeeNelson

## 2016-11-25 ENCOUNTER — Telehealth: Payer: Self-pay | Admitting: Family Medicine

## 2016-11-25 NOTE — Telephone Encounter (Signed)
Patient returned my call re: Dr. Lodema HongSimpson not taking on any new patients at this time.  I offered patient the opportunity to make an appt with Dr.  Delton SeeNelson.  Patient is not interested at this time.  Will call back if she changes her mind.  Note: Patient is a [employer], [employer].

## 2016-12-02 ENCOUNTER — Other Ambulatory Visit: Payer: Self-pay | Admitting: Obstetrics and Gynecology

## 2016-12-02 DIAGNOSIS — Z1231 Encounter for screening mammogram for malignant neoplasm of breast: Secondary | ICD-10-CM

## 2016-12-18 ENCOUNTER — Ambulatory Visit
Admission: RE | Admit: 2016-12-18 | Discharge: 2016-12-18 | Disposition: A | Discharge status: Home / Self Care | Payer: Managed Care, Other (non HMO) | Source: Ambulatory Visit | Attending: Obstetrics and Gynecology | Admitting: Obstetrics and Gynecology

## 2016-12-18 DIAGNOSIS — Z1231 Encounter for screening mammogram for malignant neoplasm of breast: Secondary | ICD-10-CM

## 2017-11-19 ENCOUNTER — Other Ambulatory Visit: Payer: Self-pay | Admitting: Obstetrics and Gynecology

## 2017-11-19 DIAGNOSIS — Z1231 Encounter for screening mammogram for malignant neoplasm of breast: Secondary | ICD-10-CM

## 2017-12-24 ENCOUNTER — Ambulatory Visit
Admission: RE | Admit: 2017-12-24 | Discharge: 2017-12-24 | Disposition: A | Discharge status: Home / Self Care | Payer: Managed Care, Other (non HMO) | Source: Ambulatory Visit | Attending: Obstetrics and Gynecology | Admitting: Obstetrics and Gynecology

## 2017-12-24 DIAGNOSIS — Z1231 Encounter for screening mammogram for malignant neoplasm of breast: Secondary | ICD-10-CM

## 2018-03-30 ENCOUNTER — Ambulatory Visit (INDEPENDENT_AMBULATORY_CARE_PROVIDER_SITE_OTHER): Payer: Managed Care, Other (non HMO)

## 2018-03-30 ENCOUNTER — Ambulatory Visit (HOSPITAL_COMMUNITY)
Admission: EM | Admit: 2018-03-30 | Discharge: 2018-03-30 | Disposition: A | Discharge status: Home / Self Care | Payer: Managed Care, Other (non HMO) | Attending: Family Medicine | Admitting: Family Medicine

## 2018-03-30 DIAGNOSIS — M79674 Pain in right toe(s): Secondary | ICD-10-CM

## 2018-03-30 DIAGNOSIS — M79644 Pain in right finger(s): Secondary | ICD-10-CM

## 2018-03-30 MED ORDER — MELOXICAM 7.5 MG PO TABS: 7.5000 mg | ORAL_TABLET | Freq: Every day | ORAL | 0 refills | Status: AC

## 2018-03-30 NOTE — ED Triage Notes (Signed)
Pt states she has had finger pain right hand index finger x 1 month.  Right foot big toe pain. Pt dropped a chair on her toe.

## 2018-03-30 NOTE — Discharge Instructions (Addendum)
Use anti-inflammatories for pain/swelling. You may take up to 600-800 mg Ibuprofen every 8 hours OR mobic daily with food. You may supplement Ibuprofen with Tylenol [phone] mg every 8 hours.   Ice and elevate toe, likely contusion/bruising  Follow up with ortho/hand if pain in finger persisting, continue to try and avoid use of finger at work

## 2018-03-31 NOTE — ED Provider Notes (Signed)
[facility]    CSN: [id number] Arrival date & time: 03/30/18  1743     History   Chief Complaint Chief Complaint  Patient presents with  . Hand Pain    HPI Alexandra [name] is a 52 y.o. female no significant past medical history presenting today for evaluation of right index finger pain and right great toe pain.  Patient states that last night she dropped a chair onto her right toe, since she has had pain and swelling.  She denies numbness.  She is also had right index finger pain that has worsened over the past month.  She denies any injury to this finger.  She has noted more swelling and pain and difficulty with bending her finger over time.  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  She has been trying to avoid using this finger for a week.  HPI  No past medical history on file.  There are no active problems to display for this patient.   No past surgical history on file.  OB History   None      Home Medications    Prior to Admission medications   Medication Sig Start Date End Date Taking? Authorizing Provider  meloxicam (MOBIC) 7.5 MG tablet Take 1 tablet (7.5 mg total) by mouth daily for 10 days. Take in the morning, with food. 03/30/18 04/09/18  Azari Hasler C, PA-C  oseltamivir (TAMIFLU) 6 MG/ML SUSR suspension Take 12.5 mLs (75 mg total) by mouth 2 (two) times daily. 10/29/16   Elvina Sidle, MD    Family History Family History  Problem Relation Age of Onset  . Breast cancer Mother   . Breast cancer Maternal Aunt     Social History Social History   Tobacco Use  . Smoking status: Never Smoker  . Smokeless tobacco: Never Used  Substance Use Topics  . Alcohol use: Yes  . Drug use: Not on file     Allergies   Patient has no known allergies.   Review of Systems Review of Systems  Constitutional: Negative for fatigue and fever.  Eyes: Negative for visual disturbance.  Respiratory: Negative for shortness of breath.     Cardiovascular: Negative for chest pain.  Gastrointestinal: Negative for abdominal pain, nausea and vomiting.  Musculoskeletal: Positive for arthralgias and myalgias. Negative for joint swelling.  Skin: Negative for color change, rash and wound.  Neurological: Negative for dizziness, weakness, light-headedness and headaches.     Physical Exam Triage Vital Signs ED Triage Vitals  Enc Vitals Group     BP 03/30/18 1848 100/62     Pulse Rate 03/30/18 1840 73     Resp 03/30/18 1840 18     Temp 03/30/18 1840 98.2 F (36.8 C)     Temp Source 03/30/18 1840 Oral     SpO2 03/30/18 1840 100 %     Weight --      Height --      Head Circumference --      Peak Flow --      Pain Score 03/30/18 2011 0     Pain Loc --      Pain Edu? --      Excl. in GC? --    No data found.  Updated Vital Signs BP 100/62   Pulse 73   Temp 98.2 F (36.8 C) (Oral)   Resp 18   LMP 02/27/2018   SpO2 100%   Visual Acuity Right Eye Distance:  Left Eye Distance:   Bilateral Distance:    Right Eye Near:   Left Eye Near:    Bilateral Near:     Physical Exam  Constitutional: She is oriented to person, place, and time. She appears well-developed and well-nourished.  No acute distress  HENT:  Head: Normocephalic and atraumatic.  Nose: Nose normal.  Eyes: Conjunctivae are normal.  Neck: Neck supple.  Cardiovascular: Normal rate.  Pulmonary/Chest: Effort normal. No respiratory distress.  Abdominal: She exhibits no distension.  Musculoskeletal: Normal range of motion.  Mild bruising below nail bed of right great toe, tenderness to palpation along interphalangeal joint  Right index finger with full active range of motion, nontender to palpation of the DIP and PIP.  Radial pulse 2+  Neurological: She is alert and oriented to person, place, and time.  Skin: Skin is warm and dry.  Psychiatric: She has a normal mood and affect.  Nursing note and vitals reviewed.    UC Treatments / Results   Labs (all labs ordered are listed, but only abnormal results are displayed) Labs Reviewed - No data to display  EKG None  Radiology Dg Finger Index Right  Result Date: 03/30/2018 CLINICAL DATA:  Right index finger pain for 1 month EXAM: RIGHT INDEX FINGER 2+V COMPARISON:  None. FINDINGS: There is no evidence of fracture or dislocation. There is no evidence of arthropathy or other focal bone abnormality. Soft tissues are unremarkable. IMPRESSION: Negative. Electronically Signed   By: Jasmine Pang M.D.   On: 03/30/2018 19:43   Dg Toe Great Right  Result Date: 03/30/2018 CLINICAL DATA:  Dropped chair on toe EXAM: RIGHT GREAT TOE COMPARISON:  None. FINDINGS: No subluxation. Possible acute minimally displaced fracture medial side base of the first distal phalanx. No radiopaque foreign body. IMPRESSION: Possible minimally displaced fracture medial side base of first distal phalanx. Electronically Signed   By: Jasmine Pang M.D.   On: 03/30/2018 19:44    Procedures Procedures (including critical care time)  Medications Ordered in UC Medications - No data to display  Initial Impression / Assessment and Plan / UC Course  I have reviewed the triage vital signs and the nursing notes.  Pertinent labs & imaging results that were available during my care of the patient were reviewed by me and considered in my medical decision making (see chart for details).     X-ray of both finger and toe obtained, toe showing possible fracture, will treat as such with anti-inflammatories, continue weightbearing, but avoid excessive exercise and impact activities.  Ice and elevate foot for 4 to 6 weeks.  Finger x-ray not showing any signs of arthritis as cause of discomfort.  Will recommend to continue anti-inflammatories for this as well and follow-up with Alice Rieger over her hand if symptoms persisting.Discussed strict return precautions. Patient verbalized understanding and is agreeable with plan.  Final Clinical  Impressions(s) / UC Diagnoses   Final diagnoses:  Finger pain, right  Great toe pain, right     Discharge Instructions     Use anti-inflammatories for pain/swelling. You may take up to 600-800 mg Ibuprofen every 8 hours OR mobic daily with food. You may supplement Ibuprofen with Tylenol [phone] mg every 8 hours.   Ice and elevate toe, likely contusion/bruising  Follow up with ortho/hand if pain in finger persisting, continue to try and avoid use of finger at work    ED Prescriptions    Medication Sig Dispense Auth. Provider   meloxicam (MOBIC) 7.5 MG tablet Take 1  tablet (7.5 mg total) by mouth daily for 10 days. Take in the morning, with food. 10 tablet Mayme Profeta, [place] C, PA-C     Controlled Substance Prescriptions [place] Controlled Substance Registry consulted? Not Applicable   Lew DawesWieters, Severus Brodzinski C, [place]-C 03/31/18 1251

## 2019-10-04 ENCOUNTER — Other Ambulatory Visit: Payer: Self-pay | Admitting: Obstetrics and Gynecology

## 2019-10-04 DIAGNOSIS — Z1231 Encounter for screening mammogram for malignant neoplasm of breast: Secondary | ICD-10-CM

## 2019-10-26 DIAGNOSIS — G4733 Obstructive sleep apnea (adult) (pediatric): Secondary | ICD-10-CM | POA: Diagnosis not present

## 2019-11-14 ENCOUNTER — Other Ambulatory Visit: Payer: Self-pay

## 2019-11-14 ENCOUNTER — Ambulatory Visit: Payer: Managed Care, Other (non HMO) | Admitting: Dermatology

## 2019-11-14 ENCOUNTER — Ambulatory Visit
Admission: RE | Admit: 2019-11-14 | Discharge: 2019-11-14 | Disposition: A | Discharge status: Home / Self Care | Payer: Federal, State, Local not specified - PPO | Source: Ambulatory Visit | Attending: Obstetrics and Gynecology | Admitting: Obstetrics and Gynecology

## 2019-11-14 ENCOUNTER — Ambulatory Visit: Payer: Federal, State, Local not specified - PPO | Admitting: Dermatology

## 2019-11-14 ENCOUNTER — Other Ambulatory Visit: Payer: Self-pay | Admitting: *Deleted

## 2019-11-14 ENCOUNTER — Encounter: Payer: Self-pay | Admitting: Dermatology

## 2019-11-14 DIAGNOSIS — Z01419 Encounter for gynecological examination (general) (routine) without abnormal findings: Secondary | ICD-10-CM | POA: Diagnosis not present

## 2019-11-14 DIAGNOSIS — Z760 Encounter for issue of repeat prescription: Secondary | ICD-10-CM | POA: Diagnosis not present

## 2019-11-14 DIAGNOSIS — L309 Dermatitis, unspecified: Secondary | ICD-10-CM | POA: Diagnosis not present

## 2019-11-14 DIAGNOSIS — Z1231 Encounter for screening mammogram for malignant neoplasm of breast: Secondary | ICD-10-CM | POA: Diagnosis not present

## 2019-11-14 MED ORDER — TACROLIMUS 0.1 % EX OINT: 1.0000 "application " | TOPICAL_OINTMENT | Freq: Every day | CUTANEOUS | 4 refills | Status: DC

## 2019-11-18 ENCOUNTER — Encounter: Payer: Self-pay | Admitting: Dermatology

## 2019-11-18 NOTE — Progress Notes (Signed)
   Follow-Up Visit   Subjective  Alexandra [name] is a 54 y.o. female who presents for the following: Medication Refill (needs refill on Protopic ointment for eczema on eyes. Works great).  Rash Location: Face, mostly around the eyes Duration: Several years Quality: Clears with tacrolimus ointment Associated Signs/Symptoms: Scaling and itch Modifying Factors: Heels with dark spots Severity:  Timing: Context:   The following portions of the chart were reviewed this encounter and updated as appropriate:     Objective  Well appearing patient in no apparent distress; mood and affect are within normal limits.  Focused skin examination: head, neck.   Assessment & Plan  May continue to apply protopic prn.

## 2019-12-20 DIAGNOSIS — E6609 Other obesity due to excess calories: Secondary | ICD-10-CM | POA: Diagnosis not present

## 2019-12-20 DIAGNOSIS — G479 Sleep disorder, unspecified: Secondary | ICD-10-CM | POA: Diagnosis not present

## 2019-12-20 DIAGNOSIS — Z23 Encounter for immunization: Secondary | ICD-10-CM | POA: Diagnosis not present

## 2019-12-20 DIAGNOSIS — G4733 Obstructive sleep apnea (adult) (pediatric): Secondary | ICD-10-CM | POA: Diagnosis not present

## 2020-01-24 DIAGNOSIS — G4733 Obstructive sleep apnea (adult) (pediatric): Secondary | ICD-10-CM | POA: Diagnosis not present

## 2020-04-23 DIAGNOSIS — G4733 Obstructive sleep apnea (adult) (pediatric): Secondary | ICD-10-CM | POA: Diagnosis not present

## 2020-04-29 DIAGNOSIS — Z0000 Encounter for general adult medical examination without abnormal findings: Secondary | ICD-10-CM | POA: Diagnosis not present

## 2020-04-29 DIAGNOSIS — Z13220 Encounter for screening for lipoid disorders: Secondary | ICD-10-CM | POA: Diagnosis not present

## 2020-05-03 DIAGNOSIS — Z23 Encounter for immunization: Secondary | ICD-10-CM | POA: Diagnosis not present

## 2020-05-03 DIAGNOSIS — Z0000 Encounter for general adult medical examination without abnormal findings: Secondary | ICD-10-CM | POA: Diagnosis not present

## 2020-07-10 DIAGNOSIS — M7742 Metatarsalgia, left foot: Secondary | ICD-10-CM | POA: Diagnosis not present

## 2020-07-10 DIAGNOSIS — M205X1 Other deformities of toe(s) (acquired), right foot: Secondary | ICD-10-CM | POA: Diagnosis not present

## 2020-07-10 DIAGNOSIS — M7732 Calcaneal spur, left foot: Secondary | ICD-10-CM | POA: Diagnosis not present

## 2020-07-10 DIAGNOSIS — M722 Plantar fascial fibromatosis: Secondary | ICD-10-CM | POA: Diagnosis not present

## 2020-07-24 ENCOUNTER — Other Ambulatory Visit: Payer: Self-pay | Admitting: Family Medicine

## 2020-07-24 DIAGNOSIS — Z1231 Encounter for screening mammogram for malignant neoplasm of breast: Secondary | ICD-10-CM

## 2020-08-02 DIAGNOSIS — U071 COVID-19: Secondary | ICD-10-CM | POA: Diagnosis not present

## 2020-08-07 DIAGNOSIS — M205X1 Other deformities of toe(s) (acquired), right foot: Secondary | ICD-10-CM | POA: Diagnosis not present

## 2020-08-07 DIAGNOSIS — M7742 Metatarsalgia, left foot: Secondary | ICD-10-CM | POA: Diagnosis not present

## 2020-08-07 DIAGNOSIS — M722 Plantar fascial fibromatosis: Secondary | ICD-10-CM | POA: Diagnosis not present

## 2020-08-07 DIAGNOSIS — M24572 Contracture, left ankle: Secondary | ICD-10-CM | POA: Diagnosis not present

## 2020-09-09 DIAGNOSIS — M722 Plantar fascial fibromatosis: Secondary | ICD-10-CM | POA: Diagnosis not present

## 2020-09-09 DIAGNOSIS — M7732 Calcaneal spur, left foot: Secondary | ICD-10-CM | POA: Diagnosis not present

## 2020-09-09 DIAGNOSIS — M205X1 Other deformities of toe(s) (acquired), right foot: Secondary | ICD-10-CM | POA: Diagnosis not present

## 2020-09-09 DIAGNOSIS — M7742 Metatarsalgia, left foot: Secondary | ICD-10-CM | POA: Diagnosis not present

## 2020-09-09 DIAGNOSIS — M7731 Calcaneal spur, right foot: Secondary | ICD-10-CM | POA: Diagnosis not present

## 2020-09-18 DIAGNOSIS — E6609 Other obesity due to excess calories: Secondary | ICD-10-CM | POA: Diagnosis not present

## 2020-09-18 DIAGNOSIS — Z8616 Personal history of COVID-19: Secondary | ICD-10-CM | POA: Diagnosis not present

## 2020-09-18 DIAGNOSIS — R059 Cough, unspecified: Secondary | ICD-10-CM | POA: Diagnosis not present

## 2020-10-07 ENCOUNTER — Ambulatory Visit
Admission: RE | Admit: 2020-10-07 | Discharge: 2020-10-07 | Disposition: A | Discharge status: Home / Self Care | Payer: Federal, State, Local not specified - PPO | Source: Ambulatory Visit | Attending: Family Medicine | Admitting: Family Medicine

## 2020-10-07 ENCOUNTER — Other Ambulatory Visit: Payer: Self-pay | Admitting: Family Medicine

## 2020-10-07 DIAGNOSIS — M205X1 Other deformities of toe(s) (acquired), right foot: Secondary | ICD-10-CM | POA: Diagnosis not present

## 2020-10-07 DIAGNOSIS — R059 Cough, unspecified: Secondary | ICD-10-CM

## 2020-10-07 DIAGNOSIS — G4733 Obstructive sleep apnea (adult) (pediatric): Secondary | ICD-10-CM | POA: Diagnosis not present

## 2020-10-07 DIAGNOSIS — M79609 Pain in unspecified limb: Secondary | ICD-10-CM | POA: Diagnosis not present

## 2020-10-07 DIAGNOSIS — M7731 Calcaneal spur, right foot: Secondary | ICD-10-CM | POA: Diagnosis not present

## 2020-10-07 DIAGNOSIS — R0981 Nasal congestion: Secondary | ICD-10-CM | POA: Diagnosis not present

## 2020-10-07 DIAGNOSIS — M7732 Calcaneal spur, left foot: Secondary | ICD-10-CM | POA: Diagnosis not present

## 2020-10-07 DIAGNOSIS — M7742 Metatarsalgia, left foot: Secondary | ICD-10-CM | POA: Diagnosis not present

## 2020-10-24 DIAGNOSIS — R42 Dizziness and giddiness: Secondary | ICD-10-CM | POA: Diagnosis not present

## 2020-10-24 DIAGNOSIS — J302 Other seasonal allergic rhinitis: Secondary | ICD-10-CM | POA: Diagnosis not present

## 2020-10-29 DIAGNOSIS — H8110 Benign paroxysmal vertigo, unspecified ear: Secondary | ICD-10-CM | POA: Diagnosis not present

## 2020-10-29 DIAGNOSIS — J302 Other seasonal allergic rhinitis: Secondary | ICD-10-CM | POA: Diagnosis not present

## 2020-11-04 DIAGNOSIS — M7742 Metatarsalgia, left foot: Secondary | ICD-10-CM | POA: Diagnosis not present

## 2020-11-04 DIAGNOSIS — M205X1 Other deformities of toe(s) (acquired), right foot: Secondary | ICD-10-CM | POA: Diagnosis not present

## 2020-11-04 DIAGNOSIS — M7732 Calcaneal spur, left foot: Secondary | ICD-10-CM | POA: Diagnosis not present

## 2020-11-04 DIAGNOSIS — M722 Plantar fascial fibromatosis: Secondary | ICD-10-CM | POA: Diagnosis not present

## 2020-11-04 DIAGNOSIS — M7731 Calcaneal spur, right foot: Secondary | ICD-10-CM | POA: Diagnosis not present

## 2020-11-06 ENCOUNTER — Ambulatory Visit: Payer: Federal, State, Local not specified - PPO | Admitting: Physical Therapy

## 2020-11-07 ENCOUNTER — Other Ambulatory Visit: Payer: Self-pay

## 2020-11-07 ENCOUNTER — Ambulatory Visit: Payer: Federal, State, Local not specified - PPO | Attending: Family Medicine | Admitting: Physical Therapy

## 2020-11-07 ENCOUNTER — Encounter: Payer: Self-pay | Admitting: Physical Therapy

## 2020-11-07 DIAGNOSIS — M25571 Pain in right ankle and joints of right foot: Secondary | ICD-10-CM | POA: Diagnosis not present

## 2020-11-07 DIAGNOSIS — M9903 Segmental and somatic dysfunction of lumbar region: Secondary | ICD-10-CM | POA: Diagnosis not present

## 2020-11-07 DIAGNOSIS — M9906 Segmental and somatic dysfunction of lower extremity: Secondary | ICD-10-CM | POA: Diagnosis not present

## 2020-11-07 DIAGNOSIS — H8112 Benign paroxysmal vertigo, left ear: Secondary | ICD-10-CM | POA: Diagnosis not present

## 2020-11-07 DIAGNOSIS — M722 Plantar fascial fibromatosis: Secondary | ICD-10-CM | POA: Diagnosis not present

## 2020-11-07 DIAGNOSIS — M5137 Other intervertebral disc degeneration, lumbosacral region: Secondary | ICD-10-CM | POA: Diagnosis not present

## 2020-11-07 NOTE — Patient Instructions (Signed)
Self Treatment for Left Posterior / Anterior Canalithiasis    Sitting on bed: 1. Turn head 45 left. (a) Lie back slowly, shoulders on pillow, head on bed. (b) Hold ____ seconds. 2. Keeping head on bed, turn head 90 right. Hold ____ seconds. 3. Roll to right, head on 45 angle down toward bed. Hold ____ seconds. 4. Sit up on right side of bed. Repeat ____ times per session. Do ____ sessions per day.  Copyright  VHI. All rights reserved.   

## 2020-11-08 NOTE — Therapy (Signed)
[facility] [address] [place], [place], [address] Phone: [phone]   Fax:  [phone]  Physical Therapy Evaluation  Patient Details  Name: YANELI KEITHLEY MRN: [id number] Date of Birth: 07/03/1966 Referring Provider (PT): Dr. Deatra James   Encounter Date: 11/07/2020   PT End of Session - 11/08/20 2118    Visit Number 1    Number of Visits 1    Authorization Type BCBS    PT Start Time 1104    PT Stop Time 1145    PT Time Calculation (min) 41 min    Activity Tolerance Patient tolerated treatment well    Behavior During Therapy [place] for tasks assessed/performed           History reviewed. No pertinent past medical history.  History reviewed. No pertinent surgical history.  There were no vitals filed for this visit.        [facility] PT Assessment - 11/08/20 0001      Assessment   Medical Diagnosis BPPV    Referring Provider (PT) Dr. Deatra James    Onset Date/Surgical Date 10/29/20    Prior Therapy none      Balance Screen   Has the patient fallen in the past 6 months No    Has the patient had a decrease in activity level because of a fear of falling?  No    Is the patient reluctant to leave their home because of a fear of falling?  No      Prior Function   Level of Independence Independent                  Vestibular Assessment - 11/08/20 0001      Vestibular Assessment   General Observation Pt states today is the best day she has had (no vertigo)      Symptom Behavior   Type of Dizziness  Spinning    Frequency of Dizziness had been occurring daily up until past couple of days    Duration of Dizziness secs to minutes    Symptom Nature Positional    Aggravating Factors Rolling to left    Relieving Factors Head stationary;Lying supine    Progression of Symptoms Better      Oculomotor Exam   Oculomotor Alignment Normal    Spontaneous Absent      Positional Testing   Dix-Hallpike  Dix-Hallpike Right;Dix-Hallpike Left    Sidelying Test Sidelying Right;Sidelying Left      Dix-Hallpike Right   Dix-Hallpike Right Duration none    Dix-Hallpike Right Symptoms No nystagmus      Dix-Hallpike Left   Dix-Hallpike Left Duration none    Dix-Hallpike Left Symptoms No nystagmus      Sidelying Right   Sidelying Right Duration none    Sidelying Right Symptoms No nystagmus      Sidelying Left   Sidelying Left Duration none    Sidelying Left Symptoms No nystagmus              Objective measurements completed on examination: See above findings.               PT Education - 11/08/20 2117    Education Details educated in Epley maneuver for self treatment prn; gave info on etiology of BPPV from [facility]    Person(s) Educated Patient    Methods Explanation    Comprehension Verbalized understanding               PT Long  Term Goals - 11/08/20 2122      PT LONG TERM GOAL #1   Title N/A = eval only                  Plan - 11/08/20 2119    Clinical Impression Statement Pt has no c/o vertigo at this time - no vertigo provoked with any positional testing.  Rt & Lt Dix-Hallpike tests (-) with no nystagmus & no c/o vertigo.  Pt has symptoms consistent with BPPV which has currently resolved.  No PT warranted at this time.    Personal Factors and Comorbidities Comorbidity 1    Examination-Activity Limitations Locomotion Level;Bed Mobility    Examination-Participation Restrictions Laundry;Cleaning;Meal Prep;Shop    Stability/Clinical Decision Making Stable/Uncomplicated    Clinical Decision Making Low    PT Frequency One time visit    PT Next Visit Plan Eval only    Consulted and Agree with Plan of Care Patient           Patient will benefit from skilled therapeutic intervention in order to improve the following deficits and impairments:     Visit Diagnosis: BPPV (benign paroxysmal positional vertigo), left - Plan: PT plan of care  cert/re-cert     Problem List There are no problems to display for this patient.   Kary Kos, PT 11/08/2020, 9:24 PM  [facility] [address] [place], [place], [address] Phone: [phone]   Fax:  [phone]  Name: ALAYIAH FONTES MRN: [id number] Date of Birth: [birth year]

## 2020-11-14 ENCOUNTER — Ambulatory Visit: Payer: Federal, State, Local not specified - PPO

## 2020-11-14 ENCOUNTER — Other Ambulatory Visit: Payer: Self-pay

## 2020-11-14 ENCOUNTER — Ambulatory Visit
Admission: RE | Admit: 2020-11-14 | Discharge: 2020-11-14 | Disposition: A | Discharge status: Home / Self Care | Payer: Federal, State, Local not specified - PPO | Source: Ambulatory Visit | Attending: Family Medicine | Admitting: Family Medicine

## 2020-11-14 DIAGNOSIS — Z1231 Encounter for screening mammogram for malignant neoplasm of breast: Secondary | ICD-10-CM

## 2020-11-14 DIAGNOSIS — Z01419 Encounter for gynecological examination (general) (routine) without abnormal findings: Secondary | ICD-10-CM | POA: Diagnosis not present

## 2020-11-18 ENCOUNTER — Ambulatory Visit: Payer: Federal, State, Local not specified - PPO | Admitting: Dermatology

## 2020-11-19 ENCOUNTER — Telehealth: Payer: Self-pay | Admitting: Dermatology

## 2020-11-19 DIAGNOSIS — M9903 Segmental and somatic dysfunction of lumbar region: Secondary | ICD-10-CM | POA: Diagnosis not present

## 2020-11-19 DIAGNOSIS — M5137 Other intervertebral disc degeneration, lumbosacral region: Secondary | ICD-10-CM | POA: Diagnosis not present

## 2020-11-19 DIAGNOSIS — M25571 Pain in right ankle and joints of right foot: Secondary | ICD-10-CM | POA: Diagnosis not present

## 2020-11-19 DIAGNOSIS — M9906 Segmental and somatic dysfunction of lower extremity: Secondary | ICD-10-CM | POA: Diagnosis not present

## 2020-11-19 NOTE — Telephone Encounter (Signed)
Wants ST to write a note summarizing what has gone on with her alopecia; history + current status. She has appt. on Monday 11/25/20 and would like to pick it up at time of visit. Let her know if this can be done please.

## 2020-11-19 NOTE — Telephone Encounter (Signed)
Please let Alexandra [name] know that when she was last seen over a year ago we had just begun using the [facility] electronic record and there was nothing recorded at that visit relating to her alopecia.  Please pull her paper chart and let her know that if I can summarize information from her previous records relating to her alopecia I would be happy to do that and we will try to have it ready by the time of her upcoming visit. Unfortunately, we won't be able to let her know if this was done until her visit.

## 2020-11-21 DIAGNOSIS — M9903 Segmental and somatic dysfunction of lumbar region: Secondary | ICD-10-CM | POA: Diagnosis not present

## 2020-11-21 DIAGNOSIS — M9906 Segmental and somatic dysfunction of lower extremity: Secondary | ICD-10-CM | POA: Diagnosis not present

## 2020-11-21 DIAGNOSIS — M25571 Pain in right ankle and joints of right foot: Secondary | ICD-10-CM | POA: Diagnosis not present

## 2020-11-21 DIAGNOSIS — M5137 Other intervertebral disc degeneration, lumbosacral region: Secondary | ICD-10-CM | POA: Diagnosis not present

## 2020-11-21 NOTE — Telephone Encounter (Signed)
Phone call to patient with Dr. Sherryl Barters recommendations regarding writing a letter for her.  Patient aware, patient states that she needs the letter to state certain things for insurance purposes.  I informed patient that it would be best to see Dr. Jorja Loa and discuss this information with him so that the letter can be written correctly.  Patient aware and states that she will speak with Dr. Jorja Loa at her visit on 11/25/2020.

## 2020-11-25 ENCOUNTER — Encounter: Payer: Self-pay | Admitting: Dermatology

## 2020-11-25 ENCOUNTER — Other Ambulatory Visit: Payer: Self-pay

## 2020-11-25 ENCOUNTER — Ambulatory Visit: Payer: Federal, State, Local not specified - PPO | Admitting: Dermatology

## 2020-11-25 DIAGNOSIS — L669 Cicatricial alopecia, unspecified: Secondary | ICD-10-CM

## 2020-11-25 DIAGNOSIS — L309 Dermatitis, unspecified: Secondary | ICD-10-CM

## 2020-11-25 DIAGNOSIS — M9903 Segmental and somatic dysfunction of lumbar region: Secondary | ICD-10-CM | POA: Diagnosis not present

## 2020-11-25 DIAGNOSIS — M9906 Segmental and somatic dysfunction of lower extremity: Secondary | ICD-10-CM | POA: Diagnosis not present

## 2020-11-25 DIAGNOSIS — M5137 Other intervertebral disc degeneration, lumbosacral region: Secondary | ICD-10-CM | POA: Diagnosis not present

## 2020-11-25 DIAGNOSIS — M25571 Pain in right ankle and joints of right foot: Secondary | ICD-10-CM | POA: Diagnosis not present

## 2020-11-25 DIAGNOSIS — H01009 Unspecified blepharitis unspecified eye, unspecified eyelid: Secondary | ICD-10-CM

## 2020-11-25 MED ORDER — TACROLIMUS 0.1 % EX OINT: 1.0000 "application " | TOPICAL_OINTMENT | Freq: Every day | CUTANEOUS | 8 refills | Status: DC

## 2020-11-25 MED ORDER — CLOBETASOL PROPIONATE 0.05 % EX FOAM: Freq: Two times a day (BID) | CUTANEOUS | 6 refills | Status: DC

## 2020-11-26 DIAGNOSIS — M5137 Other intervertebral disc degeneration, lumbosacral region: Secondary | ICD-10-CM | POA: Diagnosis not present

## 2020-11-26 DIAGNOSIS — M9906 Segmental and somatic dysfunction of lower extremity: Secondary | ICD-10-CM | POA: Diagnosis not present

## 2020-11-26 DIAGNOSIS — M9903 Segmental and somatic dysfunction of lumbar region: Secondary | ICD-10-CM | POA: Diagnosis not present

## 2020-11-26 DIAGNOSIS — M25571 Pain in right ankle and joints of right foot: Secondary | ICD-10-CM | POA: Diagnosis not present

## 2020-11-28 DIAGNOSIS — M9906 Segmental and somatic dysfunction of lower extremity: Secondary | ICD-10-CM | POA: Diagnosis not present

## 2020-11-28 DIAGNOSIS — M9903 Segmental and somatic dysfunction of lumbar region: Secondary | ICD-10-CM | POA: Diagnosis not present

## 2020-11-28 DIAGNOSIS — M25571 Pain in right ankle and joints of right foot: Secondary | ICD-10-CM | POA: Diagnosis not present

## 2020-11-28 DIAGNOSIS — M5137 Other intervertebral disc degeneration, lumbosacral region: Secondary | ICD-10-CM | POA: Diagnosis not present

## 2020-12-02 DIAGNOSIS — M9903 Segmental and somatic dysfunction of lumbar region: Secondary | ICD-10-CM | POA: Diagnosis not present

## 2020-12-02 DIAGNOSIS — M5137 Other intervertebral disc degeneration, lumbosacral region: Secondary | ICD-10-CM | POA: Diagnosis not present

## 2020-12-02 DIAGNOSIS — M9906 Segmental and somatic dysfunction of lower extremity: Secondary | ICD-10-CM | POA: Diagnosis not present

## 2020-12-02 DIAGNOSIS — M25571 Pain in right ankle and joints of right foot: Secondary | ICD-10-CM | POA: Diagnosis not present

## 2020-12-03 DIAGNOSIS — M5137 Other intervertebral disc degeneration, lumbosacral region: Secondary | ICD-10-CM | POA: Diagnosis not present

## 2020-12-03 DIAGNOSIS — M9906 Segmental and somatic dysfunction of lower extremity: Secondary | ICD-10-CM | POA: Diagnosis not present

## 2020-12-03 DIAGNOSIS — M9903 Segmental and somatic dysfunction of lumbar region: Secondary | ICD-10-CM | POA: Diagnosis not present

## 2020-12-03 DIAGNOSIS — M25571 Pain in right ankle and joints of right foot: Secondary | ICD-10-CM | POA: Diagnosis not present

## 2020-12-04 DIAGNOSIS — M9906 Segmental and somatic dysfunction of lower extremity: Secondary | ICD-10-CM | POA: Diagnosis not present

## 2020-12-04 DIAGNOSIS — M9903 Segmental and somatic dysfunction of lumbar region: Secondary | ICD-10-CM | POA: Diagnosis not present

## 2020-12-04 DIAGNOSIS — M7731 Calcaneal spur, right foot: Secondary | ICD-10-CM | POA: Diagnosis not present

## 2020-12-04 DIAGNOSIS — M7742 Metatarsalgia, left foot: Secondary | ICD-10-CM | POA: Diagnosis not present

## 2020-12-04 DIAGNOSIS — M5137 Other intervertebral disc degeneration, lumbosacral region: Secondary | ICD-10-CM | POA: Diagnosis not present

## 2020-12-04 DIAGNOSIS — M25571 Pain in right ankle and joints of right foot: Secondary | ICD-10-CM | POA: Diagnosis not present

## 2020-12-04 DIAGNOSIS — M7732 Calcaneal spur, left foot: Secondary | ICD-10-CM | POA: Diagnosis not present

## 2020-12-04 DIAGNOSIS — M205X1 Other deformities of toe(s) (acquired), right foot: Secondary | ICD-10-CM | POA: Diagnosis not present

## 2020-12-07 ENCOUNTER — Encounter: Payer: Self-pay | Admitting: Dermatology

## 2020-12-07 NOTE — Progress Notes (Addendum)
   Follow-Up Visit   Subjective  Alexandra [name] is a 55 y.o. female who presents for the following: Dermatitis (Around eyes- using prototic- "helps", scalp - having some soreness on scalp and using Fluocinonide that someone gave her. ).  Rash face, inflammation hair loss scalp Location:  Duration:  Quality:  Associated Signs/Symptoms: Modifying Factors:  Severity:  Timing: Context:   Objective  Well appearing patient in no apparent distress; mood and affect are within normal limits. Mid Parietal Scalp Diffuse loss of hair density with follicular dropout and probable fibrosis.  No changes on ears or nailbeds.  Left Malar Cheek, Right Malar Cheek Active inflammation has improved, some PIH.   A focused examination was performed including Head and neck.. Relevant physical exam findings are noted in the Assessment and Plan.   Assessment & Plan    Cicatricial alopecia Mid Parietal Scalp  Switch from fluocinolone to clobetasol foam (if out-of-pocket cost acceptable).  Will use daily on scalp for 10 weeks and then follow-up either in person or via MyChart.  Dermatitis Left Malar Cheek; Right Malar Cheek  Tell her the frequency of Protopic to the active inflammatory changes.  Related Medications tacrolimus (PROTOPIC) 0.1 % ointment Apply 1 application topically daily. Patient only wants brand. Does not want generic     I, Janalyn Harder, MD, have reviewed all documentation for this visit.  The documentation on 12/28/20 for the exam, diagnosis, procedures, and orders are all accurate and complete.

## 2020-12-09 DIAGNOSIS — M9903 Segmental and somatic dysfunction of lumbar region: Secondary | ICD-10-CM | POA: Diagnosis not present

## 2020-12-09 DIAGNOSIS — M25571 Pain in right ankle and joints of right foot: Secondary | ICD-10-CM | POA: Diagnosis not present

## 2020-12-09 DIAGNOSIS — M9906 Segmental and somatic dysfunction of lower extremity: Secondary | ICD-10-CM | POA: Diagnosis not present

## 2020-12-09 DIAGNOSIS — M5137 Other intervertebral disc degeneration, lumbosacral region: Secondary | ICD-10-CM | POA: Diagnosis not present

## 2020-12-12 DIAGNOSIS — M25571 Pain in right ankle and joints of right foot: Secondary | ICD-10-CM | POA: Diagnosis not present

## 2020-12-12 DIAGNOSIS — M9903 Segmental and somatic dysfunction of lumbar region: Secondary | ICD-10-CM | POA: Diagnosis not present

## 2020-12-12 DIAGNOSIS — M9906 Segmental and somatic dysfunction of lower extremity: Secondary | ICD-10-CM | POA: Diagnosis not present

## 2020-12-12 DIAGNOSIS — M5137 Other intervertebral disc degeneration, lumbosacral region: Secondary | ICD-10-CM | POA: Diagnosis not present

## 2020-12-17 DIAGNOSIS — M9903 Segmental and somatic dysfunction of lumbar region: Secondary | ICD-10-CM | POA: Diagnosis not present

## 2020-12-17 DIAGNOSIS — M25571 Pain in right ankle and joints of right foot: Secondary | ICD-10-CM | POA: Diagnosis not present

## 2020-12-17 DIAGNOSIS — M9906 Segmental and somatic dysfunction of lower extremity: Secondary | ICD-10-CM | POA: Diagnosis not present

## 2020-12-17 DIAGNOSIS — M5137 Other intervertebral disc degeneration, lumbosacral region: Secondary | ICD-10-CM | POA: Diagnosis not present

## 2020-12-19 ENCOUNTER — Encounter: Payer: Self-pay | Admitting: Dermatology

## 2020-12-24 DIAGNOSIS — M9906 Segmental and somatic dysfunction of lower extremity: Secondary | ICD-10-CM | POA: Diagnosis not present

## 2020-12-24 DIAGNOSIS — M9903 Segmental and somatic dysfunction of lumbar region: Secondary | ICD-10-CM | POA: Diagnosis not present

## 2020-12-24 DIAGNOSIS — M5137 Other intervertebral disc degeneration, lumbosacral region: Secondary | ICD-10-CM | POA: Diagnosis not present

## 2020-12-24 DIAGNOSIS — M25571 Pain in right ankle and joints of right foot: Secondary | ICD-10-CM | POA: Diagnosis not present

## 2020-12-26 DIAGNOSIS — M25571 Pain in right ankle and joints of right foot: Secondary | ICD-10-CM | POA: Diagnosis not present

## 2020-12-26 DIAGNOSIS — M9906 Segmental and somatic dysfunction of lower extremity: Secondary | ICD-10-CM | POA: Diagnosis not present

## 2020-12-26 DIAGNOSIS — M5137 Other intervertebral disc degeneration, lumbosacral region: Secondary | ICD-10-CM | POA: Diagnosis not present

## 2020-12-26 DIAGNOSIS — M9903 Segmental and somatic dysfunction of lumbar region: Secondary | ICD-10-CM | POA: Diagnosis not present

## 2021-01-06 ENCOUNTER — Other Ambulatory Visit: Payer: Self-pay

## 2021-01-06 DIAGNOSIS — L669 Cicatricial alopecia, unspecified: Secondary | ICD-10-CM

## 2021-01-07 ENCOUNTER — Encounter: Payer: Self-pay | Admitting: *Deleted

## 2021-01-07 ENCOUNTER — Encounter: Payer: Self-pay | Admitting: Dermatology

## 2021-01-07 MED ORDER — AMBULATORY NON FORMULARY MEDICATION: 0 refills | Status: DC

## 2021-01-07 NOTE — Addendum Note (Signed)
Addended by: Gilmore Laroche on: 01/07/2021 03:10 PM   Modules accepted: Orders, SmartSet

## 2021-01-07 NOTE — Progress Notes (Signed)
This encounter was created in error - please disregard.

## 2021-01-09 DIAGNOSIS — M7731 Calcaneal spur, right foot: Secondary | ICD-10-CM | POA: Diagnosis not present

## 2021-01-09 DIAGNOSIS — M7732 Calcaneal spur, left foot: Secondary | ICD-10-CM | POA: Diagnosis not present

## 2021-01-09 DIAGNOSIS — M7742 Metatarsalgia, left foot: Secondary | ICD-10-CM | POA: Diagnosis not present

## 2021-01-09 DIAGNOSIS — M205X1 Other deformities of toe(s) (acquired), right foot: Secondary | ICD-10-CM | POA: Diagnosis not present

## 2021-01-15 DIAGNOSIS — R059 Cough, unspecified: Secondary | ICD-10-CM | POA: Diagnosis not present

## 2021-01-15 DIAGNOSIS — B948 Sequelae of other specified infectious and parasitic diseases: Secondary | ICD-10-CM | POA: Diagnosis not present

## 2021-01-16 DIAGNOSIS — M9903 Segmental and somatic dysfunction of lumbar region: Secondary | ICD-10-CM | POA: Diagnosis not present

## 2021-01-16 DIAGNOSIS — M9906 Segmental and somatic dysfunction of lower extremity: Secondary | ICD-10-CM | POA: Diagnosis not present

## 2021-01-16 DIAGNOSIS — M25571 Pain in right ankle and joints of right foot: Secondary | ICD-10-CM | POA: Diagnosis not present

## 2021-01-16 DIAGNOSIS — M5137 Other intervertebral disc degeneration, lumbosacral region: Secondary | ICD-10-CM | POA: Diagnosis not present

## 2021-01-18 DIAGNOSIS — M545 Low back pain, unspecified: Secondary | ICD-10-CM | POA: Diagnosis not present

## 2021-01-18 DIAGNOSIS — M542 Cervicalgia: Secondary | ICD-10-CM | POA: Diagnosis not present

## 2021-01-22 DIAGNOSIS — S060X0A Concussion without loss of consciousness, initial encounter: Secondary | ICD-10-CM | POA: Diagnosis not present

## 2021-01-22 DIAGNOSIS — G4733 Obstructive sleep apnea (adult) (pediatric): Secondary | ICD-10-CM | POA: Diagnosis not present

## 2021-01-23 DIAGNOSIS — M7740 Metatarsalgia, unspecified foot: Secondary | ICD-10-CM | POA: Diagnosis not present

## 2021-01-23 DIAGNOSIS — M722 Plantar fascial fibromatosis: Secondary | ICD-10-CM | POA: Diagnosis not present

## 2021-01-23 DIAGNOSIS — M79609 Pain in unspecified limb: Secondary | ICD-10-CM | POA: Diagnosis not present

## 2021-01-29 DIAGNOSIS — S060X0D Concussion without loss of consciousness, subsequent encounter: Secondary | ICD-10-CM | POA: Diagnosis not present

## 2021-01-30 ENCOUNTER — Ambulatory Visit: Payer: Federal, State, Local not specified - PPO

## 2021-01-31 ENCOUNTER — Ambulatory Visit
Admission: RE | Admit: 2021-01-31 | Discharge: 2021-01-31 | Disposition: A | Discharge status: Home / Self Care | Payer: Federal, State, Local not specified - PPO | Source: Ambulatory Visit | Attending: Nurse Practitioner | Admitting: Nurse Practitioner

## 2021-01-31 ENCOUNTER — Ambulatory Visit (INDEPENDENT_AMBULATORY_CARE_PROVIDER_SITE_OTHER): Payer: Federal, State, Local not specified - PPO | Admitting: Nurse Practitioner

## 2021-01-31 VITALS — BP 110/65 | HR 71 | Temp 97.9°F | Resp 18

## 2021-01-31 DIAGNOSIS — Z8616 Personal history of COVID-19: Secondary | ICD-10-CM | POA: Insufficient documentation

## 2021-01-31 DIAGNOSIS — R053 Chronic cough: Secondary | ICD-10-CM | POA: Diagnosis not present

## 2021-01-31 DIAGNOSIS — R059 Cough, unspecified: Secondary | ICD-10-CM | POA: Diagnosis not present

## 2021-01-31 MED ORDER — BUDESONIDE-FORMOTEROL FUMARATE 80-4.5 MCG/ACT IN AERO: 2.0000 | INHALATION_SPRAY | Freq: Two times a day (BID) | RESPIRATORY_TRACT | 3 refills | Status: DC

## 2021-01-31 MED ORDER — FAMOTIDINE 40 MG/5ML PO SUSR: 20.0000 mg | Freq: Two times a day (BID) | ORAL | 0 refills | Status: DC

## 2021-01-31 MED ORDER — PREDNISONE 20 MG PO TABS: 20.0000 mg | ORAL_TABLET | Freq: Every day | ORAL | 0 refills | Status: AC

## 2021-01-31 MED ORDER — MONTELUKAST SODIUM 10 MG PO TABS: 10.0000 mg | ORAL_TABLET | Freq: Every day | ORAL | 3 refills | Status: DC

## 2021-01-31 NOTE — Assessment & Plan Note (Signed)
Chronic Cough:   Stay well hydrated  Stay active  Deep breathing exercises  May take tylenol for fever or pain   Will order chest x ray:  [facility] [address] [place], [place] [address] [phone] MON - FRI 8:00 AM - 4:00 PM - WALK IN  Will order prednisone   Cough:  Continue gastroesophageal reflux disease treatment with elevating the head your bed and taking antacids (pepcid)  Continue taking over-the-counter antihistamines and nasal fluticasone to help with allergic rhinitis (zyrtec)  Use hard candies (sugarless Jolly Ranchers) or non-mint or non-menthol containing cough drops during this time to soothe your throat.    Will order Singulair   May take zyrtec daily  Will order Symbicort 2 puffs twice daily   FATIGUE:  Hand out given on PT    Follow up:  Follow up in 2 weeks or sooner if needed

## 2021-01-31 NOTE — Patient Instructions (Addendum)
History of Covid 19 Chronic Cough:   Stay well hydrated  Stay active  Deep breathing exercises  May take tylenol for fever or pain   Will order chest x ray:  [facility] [address] [place], [place] [address] [phone] MON - FRI 8:00 AM - 4:00 PM - WALK IN  Will order prednisone   Cough:  Continue gastroesophageal reflux disease treatment with elevating the head your bed and taking antacids (pepcid)  Continue taking over-the-counter antihistamines and nasal fluticasone to help with allergic rhinitis (zyrtec)  Use hard candies (sugarless Jolly Ranchers) or non-mint or non-menthol containing cough drops during this time to soothe your throat.    Will order Singulair   May take zyrtec daily  Will order Symbicort 2 puffs twice daily   FATIGUE:  Hand out given on PT    Follow up:  Follow up in 2 weeks or sooner if needed

## 2021-01-31 NOTE — Progress Notes (Signed)
@Patient  ID: , female    DOB: [birth year], 55 y.o.   MRN: [id number]  Chief Complaint  Patient presents with   Cough    Tested positive for covid jan 2022     Referring provider: Feb 2022, MD  HPI  Patient presents for post-COVID care clinic visit.  Patient tested positive for COVID in January 2022.  She continues to have chronic cough and fatigue.  She does have chest tightness at times.  She does use albuterol inhaler.  She has no previous history of asthma before COVID. Denies f/c/s, n/v/d, hemoptysis, PND, chest pain or edema.       Allergies  Allergen Reactions   Penicillins      There is no immunization history on file for this patient.  History reviewed. No pertinent past medical history.  Tobacco History: Social History   Tobacco Use  Smoking Status Never  Smokeless Tobacco Never   Counseling given: Not Answered   Outpatient Encounter Medications as of 01/31/2021  Medication Sig   budesonide-formoterol (SYMBICORT) 80-4.5 MCG/ACT inhaler Inhale 2 puffs into the lungs 2 (two) times daily.   famotidine (PEPCID) 40 MG/5ML suspension Take 2.5 mLs (20 mg total) by mouth 2 (two) times daily.   montelukast (SINGULAIR) 10 MG tablet Take 1 tablet (10 mg total) by mouth at bedtime.   predniSONE (DELTASONE) 20 MG tablet Take 1 tablet (20 mg total) by mouth daily with breakfast for 5 days.   AMBULATORY NON FORMULARY MEDICATION Full Cranial Prosthesis: A92.82   clobetasol (OLUX) 0.05 % topical foam Apply topically 2 (two) times daily.   Multiple Vitamin (MULTIVITAMIN) tablet Take 1 tablet by mouth daily.   oseltamivir (TAMIFLU) 6 MG/ML SUSR suspension Take 12.5 mLs (75 mg total) by mouth 2 (two) times daily.   tacrolimus (PROTOPIC) 0.1 % ointment Apply 1 application topically daily. Patient only wants brand. Does not want generic   vitamin B-12 (CYANOCOBALAMIN) 1000 MCG tablet Take 1,000 mcg by mouth daily.   No facility-administered encounter  medications on file as of 01/31/2021.     Review of Systems  Review of Systems  Constitutional: Negative.   HENT: Negative.    Respiratory:  Positive for cough, chest tightness and shortness of breath.   Cardiovascular: Negative.   Gastrointestinal: Negative.   Allergic/Immunologic: Negative.   Neurological: Negative.   Psychiatric/Behavioral: Negative.        Physical Exam  BP 110/65   Pulse 71   Temp 97.9 F (36.6 C)   Resp 18   SpO2 98%   Wt Readings from Last 5 Encounters:  No data found for Wt     Physical Exam Vitals and nursing note reviewed.  Constitutional:      General: She is not in acute distress.    Appearance: She is well-developed.  Cardiovascular:     Rate and Rhythm: Normal rate and regular rhythm.  Pulmonary:     Effort: Pulmonary effort is normal.     Breath sounds: Normal breath sounds.  Neurological:     Mental Status: She is alert and oriented to person, place, and time.      Assessment & Plan:   History of COVID-19 Chronic Cough:   Stay well hydrated  Stay active  Deep breathing exercises  May take tylenol for fever or pain   Will order chest x ray:  [facility] [address] [place], [place] [address] MON - FRI 8:00 AM - 4:00 PM - WALK IN  Will  order prednisone   Cough:  Continue gastroesophageal reflux disease treatment with elevating the head your bed and taking antacids (pepcid)  Continue taking over-the-counter antihistamines and nasal fluticasone to help with allergic rhinitis (zyrtec)  Use hard candies (sugarless Jolly Ranchers) or non-mint or non-menthol containing cough drops during this time to soothe your throat.    Will order Singulair   May take zyrtec daily  Will order Symbicort 2 puffs twice daily   FATIGUE:  Hand out given on PT    Follow up:  Follow up in 2 weeks or sooner if needed     Ivonne Andrew, NP 01/31/2021

## 2021-02-20 ENCOUNTER — Ambulatory Visit: Payer: Federal, State, Local not specified - PPO | Attending: Nurse Practitioner | Admitting: Nurse Practitioner

## 2021-02-20 ENCOUNTER — Other Ambulatory Visit: Payer: Self-pay

## 2021-02-20 ENCOUNTER — Ambulatory Visit: Payer: Federal, State, Local not specified - PPO

## 2021-02-20 ENCOUNTER — Ambulatory Visit: Payer: Federal, State, Local not specified - PPO | Admitting: Nurse Practitioner

## 2021-02-20 VITALS — BP 116/80 | HR 79 | Temp 97.9°F | Resp 18

## 2021-02-20 DIAGNOSIS — R059 Cough, unspecified: Secondary | ICD-10-CM

## 2021-02-20 DIAGNOSIS — M62838 Other muscle spasm: Secondary | ICD-10-CM | POA: Diagnosis not present

## 2021-02-20 DIAGNOSIS — Z8616 Personal history of COVID-19: Secondary | ICD-10-CM

## 2021-02-20 DIAGNOSIS — M549 Dorsalgia, unspecified: Secondary | ICD-10-CM | POA: Diagnosis not present

## 2021-02-20 DIAGNOSIS — S060X0A Concussion without loss of consciousness, initial encounter: Secondary | ICD-10-CM | POA: Diagnosis not present

## 2021-02-20 DIAGNOSIS — R413 Other amnesia: Secondary | ICD-10-CM | POA: Diagnosis not present

## 2021-02-20 MED ORDER — BENZONATATE 100 MG PO CAPS: 100.0000 mg | ORAL_CAPSULE | Freq: Two times a day (BID) | ORAL | 0 refills | Status: DC | PRN

## 2021-02-20 NOTE — Patient Instructions (Addendum)
History of COVID-19 Chronic Cough:   Improving:   Stay well hydrated   Stay active   Deep breathing exercises   May take tylenol for fever or pain   Will place referral to Dr. Sherene Sires - pulmonary for cough  Will place referral with Speech Therapy- concerned for vocal chord dysfunction with cough       Cough:   Continue gastroesophageal reflux disease treatment with elevating the head your bed and taking antacids (pepcid)   Continue taking over-the-counter antihistamines and nasal fluticasone to help with allergic rhinitis (zyrtec)   Use hard candies (sugarless Jolly Ranchers) or non-mint or non-menthol containing cough drops during this time to soothe your throat.     Continue Singulair   May take zyrtec daily   Continue Symbicort 2 puffs twice daily       FATIGUE:   Hand out given on PT       Follow up:   Follow up in 2 weeks or sooner if needed
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
## 2021-02-21 ENCOUNTER — Encounter: Payer: Self-pay | Admitting: Student

## 2021-02-21 NOTE — Assessment & Plan Note (Signed)
Chronic Cough:   Improving:   Stay well hydrated   Stay active   Deep breathing exercises   May take tylenol for fever or pain   Will place referral to Dr. Sherene Sires - pulmonary for cough  Will place referral with Speech Therapy- concerned for vocal chord dysfunction with cough       Cough:   Continue gastroesophageal reflux disease treatment with elevating the head your bed and taking antacids (pepcid)   Continue taking over-the-counter antihistamines and nasal fluticasone to help with allergic rhinitis (zyrtec)   Use hard candies (sugarless Jolly Ranchers) or non-mint or non-menthol containing cough drops during this time to soothe your throat.     Continue Singulair   May take zyrtec daily   Continue Symbicort 2 puffs twice daily       FATIGUE:   Hand out given on PT

## 2021-02-25 DIAGNOSIS — M545 Low back pain, unspecified: Secondary | ICD-10-CM | POA: Diagnosis not present

## 2021-02-25 DIAGNOSIS — M546 Pain in thoracic spine: Secondary | ICD-10-CM | POA: Diagnosis not present

## 2021-02-25 DIAGNOSIS — M549 Dorsalgia, unspecified: Secondary | ICD-10-CM | POA: Diagnosis not present

## 2021-02-25 DIAGNOSIS — M542 Cervicalgia: Secondary | ICD-10-CM | POA: Diagnosis not present

## 2021-02-27 DIAGNOSIS — G4733 Obstructive sleep apnea (adult) (pediatric): Secondary | ICD-10-CM | POA: Diagnosis not present

## 2021-02-28 DIAGNOSIS — M545 Low back pain, unspecified: Secondary | ICD-10-CM | POA: Diagnosis not present

## 2021-02-28 DIAGNOSIS — M546 Pain in thoracic spine: Secondary | ICD-10-CM | POA: Diagnosis not present

## 2021-02-28 DIAGNOSIS — M542 Cervicalgia: Secondary | ICD-10-CM | POA: Diagnosis not present

## 2021-02-28 DIAGNOSIS — M549 Dorsalgia, unspecified: Secondary | ICD-10-CM | POA: Diagnosis not present

## 2021-03-04 DIAGNOSIS — M546 Pain in thoracic spine: Secondary | ICD-10-CM | POA: Diagnosis not present

## 2021-03-04 DIAGNOSIS — M545 Low back pain, unspecified: Secondary | ICD-10-CM | POA: Diagnosis not present

## 2021-03-04 DIAGNOSIS — M549 Dorsalgia, unspecified: Secondary | ICD-10-CM | POA: Diagnosis not present

## 2021-03-04 DIAGNOSIS — M542 Cervicalgia: Secondary | ICD-10-CM | POA: Diagnosis not present

## 2021-03-05 ENCOUNTER — Other Ambulatory Visit: Payer: Self-pay | Admitting: Nurse Practitioner

## 2021-03-05 DIAGNOSIS — Z8616 Personal history of COVID-19: Secondary | ICD-10-CM

## 2021-03-05 DIAGNOSIS — R053 Chronic cough: Secondary | ICD-10-CM

## 2021-03-06 DIAGNOSIS — M546 Pain in thoracic spine: Secondary | ICD-10-CM | POA: Diagnosis not present

## 2021-03-06 DIAGNOSIS — M542 Cervicalgia: Secondary | ICD-10-CM | POA: Diagnosis not present

## 2021-03-06 DIAGNOSIS — M545 Low back pain, unspecified: Secondary | ICD-10-CM | POA: Diagnosis not present

## 2021-03-06 DIAGNOSIS — M549 Dorsalgia, unspecified: Secondary | ICD-10-CM | POA: Diagnosis not present

## 2021-03-12 DIAGNOSIS — M545 Low back pain, unspecified: Secondary | ICD-10-CM | POA: Diagnosis not present

## 2021-03-12 DIAGNOSIS — M542 Cervicalgia: Secondary | ICD-10-CM | POA: Diagnosis not present

## 2021-03-12 DIAGNOSIS — M546 Pain in thoracic spine: Secondary | ICD-10-CM | POA: Diagnosis not present

## 2021-03-12 DIAGNOSIS — M549 Dorsalgia, unspecified: Secondary | ICD-10-CM | POA: Diagnosis not present

## 2021-03-13 DIAGNOSIS — M542 Cervicalgia: Secondary | ICD-10-CM | POA: Diagnosis not present

## 2021-03-13 DIAGNOSIS — M549 Dorsalgia, unspecified: Secondary | ICD-10-CM | POA: Diagnosis not present

## 2021-03-13 DIAGNOSIS — M546 Pain in thoracic spine: Secondary | ICD-10-CM | POA: Diagnosis not present

## 2021-03-13 DIAGNOSIS — M545 Low back pain, unspecified: Secondary | ICD-10-CM | POA: Diagnosis not present

## 2021-03-18 DIAGNOSIS — M545 Low back pain, unspecified: Secondary | ICD-10-CM | POA: Diagnosis not present

## 2021-03-18 DIAGNOSIS — M546 Pain in thoracic spine: Secondary | ICD-10-CM | POA: Diagnosis not present

## 2021-03-18 DIAGNOSIS — M542 Cervicalgia: Secondary | ICD-10-CM | POA: Diagnosis not present

## 2021-03-18 DIAGNOSIS — M549 Dorsalgia, unspecified: Secondary | ICD-10-CM | POA: Diagnosis not present

## 2021-03-20 ENCOUNTER — Institutional Professional Consult (permissible substitution): Payer: Federal, State, Local not specified - PPO | Admitting: Student

## 2021-03-20 DIAGNOSIS — M546 Pain in thoracic spine: Secondary | ICD-10-CM | POA: Diagnosis not present

## 2021-03-20 DIAGNOSIS — M542 Cervicalgia: Secondary | ICD-10-CM | POA: Diagnosis not present

## 2021-03-20 DIAGNOSIS — M549 Dorsalgia, unspecified: Secondary | ICD-10-CM | POA: Diagnosis not present

## 2021-03-20 DIAGNOSIS — M545 Low back pain, unspecified: Secondary | ICD-10-CM | POA: Diagnosis not present

## 2021-03-26 DIAGNOSIS — M549 Dorsalgia, unspecified: Secondary | ICD-10-CM | POA: Diagnosis not present

## 2021-03-26 DIAGNOSIS — M542 Cervicalgia: Secondary | ICD-10-CM | POA: Diagnosis not present

## 2021-03-26 DIAGNOSIS — M546 Pain in thoracic spine: Secondary | ICD-10-CM | POA: Diagnosis not present

## 2021-03-26 DIAGNOSIS — M545 Low back pain, unspecified: Secondary | ICD-10-CM | POA: Diagnosis not present

## 2021-03-27 DIAGNOSIS — M549 Dorsalgia, unspecified: Secondary | ICD-10-CM | POA: Diagnosis not present

## 2021-03-27 DIAGNOSIS — M542 Cervicalgia: Secondary | ICD-10-CM | POA: Diagnosis not present

## 2021-03-27 DIAGNOSIS — M545 Low back pain, unspecified: Secondary | ICD-10-CM | POA: Diagnosis not present

## 2021-03-27 DIAGNOSIS — M546 Pain in thoracic spine: Secondary | ICD-10-CM | POA: Diagnosis not present

## 2021-04-01 DIAGNOSIS — M549 Dorsalgia, unspecified: Secondary | ICD-10-CM | POA: Diagnosis not present

## 2021-04-01 DIAGNOSIS — M546 Pain in thoracic spine: Secondary | ICD-10-CM | POA: Diagnosis not present

## 2021-04-01 DIAGNOSIS — M542 Cervicalgia: Secondary | ICD-10-CM | POA: Diagnosis not present

## 2021-04-01 DIAGNOSIS — M545 Low back pain, unspecified: Secondary | ICD-10-CM | POA: Diagnosis not present

## 2021-04-03 ENCOUNTER — Other Ambulatory Visit: Payer: Self-pay

## 2021-04-03 ENCOUNTER — Ambulatory Visit: Payer: Federal, State, Local not specified - PPO | Attending: Nurse Practitioner

## 2021-04-03 DIAGNOSIS — R41841 Cognitive communication deficit: Secondary | ICD-10-CM | POA: Insufficient documentation

## 2021-04-03 DIAGNOSIS — M546 Pain in thoracic spine: Secondary | ICD-10-CM | POA: Diagnosis not present

## 2021-04-03 DIAGNOSIS — R498 Other voice and resonance disorders: Secondary | ICD-10-CM | POA: Diagnosis not present

## 2021-04-03 DIAGNOSIS — M545 Low back pain, unspecified: Secondary | ICD-10-CM | POA: Diagnosis not present

## 2021-04-03 DIAGNOSIS — M549 Dorsalgia, unspecified: Secondary | ICD-10-CM | POA: Diagnosis not present

## 2021-04-03 DIAGNOSIS — M542 Cervicalgia: Secondary | ICD-10-CM | POA: Diagnosis not present
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
## 2021-04-03 NOTE — Patient Instructions (Signed)
?  ABDOMINAL BREATHING FOR VCD ? ?Shoulders down - this is a cue to relax ?Place your hand on your abdomen - this helps you focus on easy abdominal breath support - the best and most relaxed way to breathe ?Breathe in through your nose and fill your belly with air, watching your hand move outward ?Breathe out through your mouth and watch your belly move in. An audible "sh"  may help ? ? ?Think of your belly as a balloon, when you fill with air (inhale), the balloon gets bigger. As the air goes out (exhale), the balloon deflates. ? ?If you are having difficulty coordinating this, lay on your back with a plastic cup on your belly and repeat the above steps, watching you belly move up with inhalation and down with exhalations ? ?Practice breathing in and out in front of a mirror, watching your belly ?Breathe in for a count of 5 and breathe out for a count of 5 ? ?Now as you breathe out, get a picture of relaxing in your mind ?Feel the constant in-out of your breathing with your belly ?Picture the tension in your throat and chest evaporate like steam, melting away and FEEL it do so ?Picture your throat opening up so wide that a grapefruit or softball could fit through your throat. ? ? ?Practice this throughout the day when you are not having symptoms. For example: in the car, when watching TV, before medications. Regular practice when you are feeling well is important. ? ?Make it automatic and use it at the first sense of throat tightness to prevent or suppress the VCD. You may start with the inhale or exhale. ? ?Be patient when completing the breathing. It may take several minutes to start feeling relief ? ?Use the breathing to "pre-treat" yourself before a known trigger for VCD. Possible triggers could be: change in air temperature, strong odors, perfume, and exercise. ? ? ?There's an App for that: Breathe2relax ? ? ?

## 2021-04-07 ENCOUNTER — Other Ambulatory Visit: Payer: Self-pay

## 2021-04-07 ENCOUNTER — Ambulatory Visit: Payer: Federal, State, Local not specified - PPO

## 2021-04-07 DIAGNOSIS — R498 Other voice and resonance disorders: Secondary | ICD-10-CM

## 2021-04-07 DIAGNOSIS — R41841 Cognitive communication deficit: Secondary | ICD-10-CM | POA: Diagnosis not present

## 2021-04-07 DIAGNOSIS — M542 Cervicalgia: Secondary | ICD-10-CM | POA: Diagnosis not present

## 2021-04-07 DIAGNOSIS — M545 Low back pain, unspecified: Secondary | ICD-10-CM | POA: Diagnosis not present

## 2021-04-07 DIAGNOSIS — M546 Pain in thoracic spine: Secondary | ICD-10-CM | POA: Diagnosis not present

## 2021-04-07 DIAGNOSIS — M549 Dorsalgia, unspecified: Secondary | ICD-10-CM | POA: Diagnosis not present

## 2021-04-07 NOTE — Therapy (Signed)
[facility] [address] [place], [place], [address] Phone: [phone]   Fax:  [phone]  Speech Language Pathology Treatment  Patient Details  Name: Alexandra [name] MRN: [id number] Date of Birth: 1966/01/09 Referring Provider (SLP): Ivonne Andrew, NP   Encounter Date: 04/07/2021   End of Session - 04/07/21 1741     Visit Number 2    Number of Visits 17    Date for SLP Re-Evaluation 05/30/21    Authorization Type BCBS    Authorization Time Period VL combined with PT, OT, ST: 50, 25 used, this is a hard max    SLP Start Time 1743    SLP Stop Time  1828    SLP Time Calculation (min) 45 min    Activity Tolerance Patient tolerated treatment well             History reviewed. No pertinent past medical history.  History reviewed. No pertinent surgical history.  There were no vitals filed for this visit.   Subjective Assessment - 04/07/21 1743     Subjective "Not too bad" re: coughing    Currently in Pain? No/denies                   ADULT SLP TREATMENT - 04/07/21 1741       General Information   Behavior/Cognition Alert;Cooperative;Pleasant mood      Treatment Provided   Treatment provided Cognitive-Linquistic      Cognitive-Linquistic Treatment   Treatment focused on Voice;Cognition    Skilled Treatment Pt reports minimal coughing episodes since ST eval. Pt has implemented two throat clear alternatives (sips of water and gentle cough), which have been effective as pt becomes more cognizant of throat clearing. Pt utilized alternative x1 (gentle cough) this session, with no other throat clearing or coughing noted this session. Pt is experiencing trouble with completion of abdominal breathing, in which SLP recommended laying flat to aid comprehension. SLP educated patient on rescue breath of sniff, sniff, and blow through pursed lips as needed. Pt reports some hoarseness after long shift at  work. SLP introduced vocal hygiene and SOVT exercises this session to target change in vocal quality. Some reduced breath support and unintentional pitch changes noted in patient return of education.      Assessment / Recommendations / Plan   Plan Continue with current plan of care      Progression Toward Goals   Progression toward goals Progressing toward goals              SLP Education - 04/07/21 1810     Education Details abdominal breathing, rescue breathing if coughing episode occurs, vocal hygiene, SOVTE    Person(s) Educated Patient    Methods Explanation;Demonstration;Handout    Comprehension Verbalized understanding;Returned demonstration;Need further instruction              SLP Short Term Goals - 04/07/21 1742       SLP SHORT TERM GOAL #1   Title Pt will complete abdominal breathing in isolation with rare min A for 3 minutes over 2 sessions    Time 4    Period Weeks    Status On-going    Target Date 05/02/21      SLP SHORT TERM GOAL #2   Title Pt will utilize of abdominal breathing (sniff/blow) for 18/20 sentence responses over 2 sessions    Time 4    Period Weeks    Status On-going    Target Date  05/02/21      SLP SHORT TERM GOAL #3   Title Pt will eliminate throat clears by using 2 throat clear alternatives over 2 weeks    Time 4    Period Weeks    Status On-going    Target Date 05/02/21      SLP SHORT TERM GOAL #4   Title Pt will report 25% reduction of VCD s/sx subjectively with use of learned techniques    Time 4    Period Weeks    Status On-going    Target Date 05/02/21      SLP SHORT TERM GOAL #5   Title Pt will successfully use relaxation techniques outside of ST to reduce cough frequency for 2 or more episodes    Time 4    Period Weeks    Status On-going    Target Date 05/02/21      SLP SHORT TERM GOAL #6   Title Pt will complete formal cognitive linguistic assessment as needed    Time 4    Period Weeks    Status On-going     Target Date 05/02/21              SLP Long Term Goals - 04/07/21 1742       SLP LONG TERM GOAL #1   Title Pt will accurately use abdominal breathing in 20 minute conversation with rare min A over 2 sessions    Time 6    Period Weeks    Status On-going    Target Date 05/16/21      SLP LONG TERM GOAL #2   Title Pt will report 50% reduction of VCD s/sx subjectively with use of learned techniques    Time 8    Period Weeks    Status On-going    Target Date 05/30/21      SLP LONG TERM GOAL #3   Title Pt will improve score of VCD PROM by at least 2 points by last ST session    Baseline eval = 40    Time 8    Period Weeks    Status On-going    Target Date 05/30/21              Plan - 04/07/21 1742     Clinical Impression Statement "[name]" was referred for OPST due to chronic cough and memory loss s/p COVID in January 2022. Pt reports her rare coughing episodes since ST eval. Pt has focused on implementing throat clear alternatives. SLP introduced vocal hygiene protocol and SOVT exercises this session to address change in vocal quality. Reduced breath support and unintentional pitch variations noted during patient return of education. Skilled ST is warranted to address chronic cough (with suspected VCD) and possibly cognitive communication to maximize pt's return to PLOF.    Speech Therapy Frequency 2x / week    Duration 8 weeks    Treatment/Interventions Compensatory strategies;Functional tasks;Patient/family education;Environmental controls;Cognitive reorganization;Compensatory techniques;Internal/external aids;SLP instruction and feedback    Potential to Achieve Goals Good    SLP Home Exercise Plan provided    Consulted and Agree with Plan of Care Patient             Patient will benefit from skilled therapeutic intervention in order to improve the following deficits and impairments:   Other voice and resonance disorders  Cognitive communication  deficit    Problem List Patient Active Problem List   Diagnosis Date Noted   Chronic cough 01/31/2021   History of COVID-19 01/31/2021  Alexandra Colonel, MA CCC-SLP 04/07/2021, 6:40 PM  [facility] [address] [place], [place], [address] Phone: [phone]   Fax:  [phone]   Name: ABIGIAL NEWVILLE MRN: [id number] Date of Birth: February 23, 1966

## 2021-04-07 NOTE — Patient Instructions (Addendum)
Practice abdominal breathing by laying flat in your bed. Place a plastic cup on your belly to see if you can make it move    Inhale - belly expands/goes out Exhale - belly comes back in  Rescue breath if you begin coughing= sniff/sniff/blow (out through pursed lips/kissy lips/"sh")     VOICE CONSERVATION PROGRAM  1. Avoid overuse of voice or excessive use of the voice The vocal cords can become easily fatigued. Try to sort out what is "necessary" versus "unnecessary" talking in your environment. You do not need to STOP talking, but try to limit it as much as possible.  Think of resting your voice just as long as you talk. For example, if you talk for 5 minutes, rest your voice completely for 5 minutes. If your voice feels "tired" during the day, try to rest it as much as possible.  Avoid using an excessively loud voice or shouting/raising your voice When you yell or raise your voice, the vocal cords slam into each other, much like a strong hand clap. This causes irritation, and if this irritation continues, hoarseness may increase. If people in your home talk loudly, ask them to reduce the volume of their voices to help you decrease your volume as well. Sit near or face the person to whom you are speaking.   3. Avoid talking over background noise When talking in background noise, speech automatically has increased loudness, and as in number 2 above, continued loud speech can result in increased hoarseness due to irritation to the vocal cords.  Do not talk over the radio or the TV. Mute them before speaking, go to a quieter place to talk, or sit next to the person with whom you are watching.  4. Talk in a voice that is soft, smooth, and gentle This allows the vocal cords to come together in a gentle way and allows the air being exhaled from the lungs to do most/all of the work when you are speaking.  5. Avoid excessive throat clearing, coughing, and loud laughter During these activities  the vocal cords can also be slammed together in a hard way and foster irritation or swelling, which can cause hoarseness. Try taking sips of room temperature/cool water with hard swallows to clear secretions from the throat, instead of throat clearing or coughing. If you absolutely must clear your throat, do so as gently as possible. If you find yourself clearing your throat or coughing a lot, consult your physician. You will want to laugh. Continue to do so, but softly and gently. Do not laugh loudly for long periods of time because this can increase the chances for vocal fold irritation and thus hoarseness. Lozenges can help reduce the need to clear throat/cough during cold/allergy season.  6. Keep the mouth and throat lubricated Drink at least 8-10 8 oz. glasses of water per day (64-80 oz.). This water can come in the form of drink mixes.  Caffeinated beverages such as colas, coffee, and tea (hot tea AND sweet tea) dry out the vocal cords and then can cause irritation and thus hoarseness.  Drinking water will keep your body hydrated. This will help to decrease secretions in the throat, which cause people to clear their throats or cough. If you are exercising outside (especially during drier weather), try to breathe through your nose as much as possible. If this is not possible, lifting the tongue up behind the upper teeth when breathing through the mouth will add some moisture to the air breathed  in past the vocal cords.  7. Avoid mouth breathing - breathe through your nose Your nose serves as a natural filter for dust and dirt particles from the air, and as a humidifier to moisten the air. Vocal cords like to work in moistened, filtered air. When you breathe through your mouth you lose the air filtering and moistening benefits of breathing through the nose. Be aware of breathing patterns when sitting quietly (e.g., reading or watching TV). Increase your awareness and try to change habits from mouth  breathing to nose breathing during those times.  8. Avoid environmental and/or ingested irritants Try to avoid smoke-filled and or dusty environments. These items dry out the vocal cords and cause irritation.  9. Use an air filter if the home is dusty, and/or a humidifier if the air is dry  This will help to maintain clean, humid air to breathe. Remember, this type of air is what the vocal cords like best.

## 2021-04-09 NOTE — Progress Notes (Signed)
Synopsis: Referred for cough by Deatra James, MD  Subjective:   PATIENT ID: Marny Lowenstein GENDER: female DOB: 02-29-1966, MRN: [id number]  Chief Complaint  Patient presents with   Consult    Patient reports that she has some dry cough and times and feels like sputum in the back of her throat, She reports that she feels short of breath with exertion.    55yF with history of covid-19 infection 07/2020, cough/VCD followed by speech. She was vaccinated with pfizer and booster. Never had asthma as a kid. Never has ahd sinus surgery  She was not hospitalized but was out of work for [employer] dealing with it. Never got any specific treatment for it.   She has cough that is sometimes dry, sometimes productive of clearish sputum. SHe is seeing speech but they haven't done FNL. She doesn't have accompanying throat tightness but she does have associated hoarseness. Gets occasional chest tightness which is improved with albuterol. She periodically has PND but it is getting better (2-3x per week still). She does have some sinonasal congestion.  She has no reflux/heartburn symptoms.   She does think a course of prednisone imrpoved her cough when she got it.  Otherwise pertinent review of systems is negative.  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 She has no exposure to dusts/solvents/particulates without mask other than occasional candle-making. She has a dog. No pet bird. No hot tub. Never has lived outside of [place].  She never smoked, vaped.   No past medical history on file.   Family History  Problem Relation Age of Onset   Breast cancer Mother    Breast cancer Maternal Aunt      No past surgical history on file.  Social History   Socioeconomic History   Marital status: Married    Spouse name: Not on file   Number of children: Not on file   Years of education: Not on file   Highest  education level: Not on file  Occupational History   Not on file  Tobacco Use   Smoking status: Never   Smokeless tobacco: Never  Substance and Sexual Activity   Alcohol use: Yes   Drug use: Never   Sexual activity: Not on file  Other Topics Concern   Not on file  Social History Narrative   Not on file   Social Determinants of Health   Financial Resource Strain: Not on file  Food Insecurity: Not on file  Transportation Needs: Not on file  Physical Activity: Not on file  Stress: Not on file  Social Connections: Not on file  Intimate Partner Violence: Not on file     Allergies  Allergen Reactions   Penicillins Swelling    Patient reports he tongue swelled.     Outpatient Medications Prior to Visit  Medication Sig Dispense Refill   albuterol (VENTOLIN HFA) 108 (90 Base) MCG/ACT inhaler SMARTSIG:1 Puff(s) By Mouth Every 4 Hours PRN     AMBULATORY NON FORMULARY MEDICATION Full Cranial Prosthesis: A92.82 12 applicator 0   Multiple Vitamin (MULTIVITAMIN) tablet Take 1 tablet by mouth daily.     tacrolimus (PROTOPIC) 0.1 % ointment Apply 1 application topically daily. Patient only wants brand. Does not want generic 100 g 8   vitamin B-12 (CYANOCOBALAMIN) 1000 MCG tablet Take 1,000 mcg by mouth daily.     benzonatate (TESSALON) 100  MG capsule Take 1 capsule (100 mg total) by mouth 2 (two) times daily as needed for cough. (Patient not taking: No sig reported) 20 capsule 0   budesonide-formoterol (SYMBICORT) 80-4.5 MCG/ACT inhaler Inhale 2 puffs into the lungs 2 (two) times daily. (Patient not taking: Reported on 04/10/2021) 1 each 3   clobetasol (OLUX) 0.05 % topical foam Apply topically 2 (two) times daily. (Patient not taking: Reported on 04/10/2021) 100 g 6   diazepam (VALIUM) 2 MG tablet Take 1 tablet by mouth. (Patient not taking: Reported on 04/10/2021)     famotidine (PEPCID) 40 MG/5ML suspension Take 2.5 mLs (20 mg total) by mouth 2 (two) times daily. 150 mL 0   montelukast  (SINGULAIR) 10 MG tablet Take 1 tablet (10 mg total) by mouth at bedtime. (Patient not taking: No sig reported) 30 tablet 3   oseltamivir (TAMIFLU) 6 MG/ML SUSR suspension Take 12.5 mLs (75 mg total) by mouth 2 (two) times daily. (Patient not taking: No sig reported) 125 mL 0   predniSONE (DELTASONE) 10 MG tablet Take by mouth. (Patient not taking: Reported on 04/10/2021)     SAXENDA 18 MG/3ML SOPN Inject into the skin. (Patient not taking: Reported on 04/10/2021)     No facility-administered medications prior to visit.       Objective:   Physical Exam:  General appearance: 55 y.o., female, NAD, conversant  Eyes: anicteric sclerae, moist conjunctivae; no lid-lag; PERRL, tracking appropriately HENT: NCAT; oropharynx, MMM, no mucosal ulcerations; normal hard and soft palate Neck: Trachea midline; no lymphadenopathy, no JVD Lungs: CTAB, no crackles, no wheeze, with normal respiratory effort CV: RRR, no MRGs  Abdomen: Soft, non-tender; non-distended, BS present  Extremities: No peripheral edema, radial and DP pulses present bilaterally  Skin: Normal temperature, turgor and texture; no rash Psych: Appropriate affect Neuro: Alert and oriented to person and place, no focal deficit    Vitals:   04/10/21 1443  BP: 116/78  Pulse: 92  Temp: 98.1 F (36.7 C)  TempSrc: Oral  SpO2: 97%  Weight: 195 lb 9.6 oz (88.7 kg)  Height: 5' (1.524 m)   97% on RA BMI Readings from Last 3 Encounters:  04/10/21 38.20 kg/m   Wt Readings from Last 3 Encounters:  04/10/21 195 lb 9.6 oz (88.7 kg)     CBC No results found for: WBC, RBC, HGB, HCT, PLT, MCV, MCH, MCHC, RDW, LYMPHSABS, MONOABS, EOSABS, BASOSABS    Chest Imaging: CXR 01/2021 reviewed by me and unremarkable  Pulmonary Functions Testing Results: No flowsheet data found.       Assessment & Plan:   # Chronic cough # DOE Likely multifactorial, does have active post-nasal drainage, some sinonasal congestion. GERD possible but no  overt symptoms. Prednisone turned things around so asthma a possibility. Speech following with some concern for component of VCD. Her DOE could reflect deconditioning, asthma, CHF but not volume overloaded on exam, among other possibilities.  Plan: - PFTs - speech following - trial of flonase      Omar Person, MD [facility] 04/10/2021 2:59 PM

## 2021-04-10 ENCOUNTER — Encounter: Payer: Self-pay | Admitting: Student

## 2021-04-10 ENCOUNTER — Ambulatory Visit: Payer: Federal, State, Local not specified - PPO

## 2021-04-10 ENCOUNTER — Ambulatory Visit: Payer: Federal, State, Local not specified - PPO | Admitting: Student

## 2021-04-10 ENCOUNTER — Other Ambulatory Visit: Payer: Self-pay

## 2021-04-10 VITALS — BP 116/78 | HR 92 | Temp 98.1°F | Ht 60.0 in | Wt 195.6 lb

## 2021-04-10 DIAGNOSIS — R498 Other voice and resonance disorders: Secondary | ICD-10-CM | POA: Diagnosis not present

## 2021-04-10 DIAGNOSIS — R41841 Cognitive communication deficit: Secondary | ICD-10-CM | POA: Diagnosis not present

## 2021-04-10 DIAGNOSIS — M545 Low back pain, unspecified: Secondary | ICD-10-CM | POA: Diagnosis not present

## 2021-04-10 DIAGNOSIS — R053 Chronic cough: Secondary | ICD-10-CM

## 2021-04-10 DIAGNOSIS — R0600 Dyspnea, unspecified: Secondary | ICD-10-CM | POA: Diagnosis not present

## 2021-04-10 DIAGNOSIS — M546 Pain in thoracic spine: Secondary | ICD-10-CM | POA: Diagnosis not present

## 2021-04-10 DIAGNOSIS — M542 Cervicalgia: Secondary | ICD-10-CM | POA: Diagnosis not present

## 2021-04-10 DIAGNOSIS — R0609 Other forms of dyspnea: Secondary | ICD-10-CM

## 2021-04-10 DIAGNOSIS — M549 Dorsalgia, unspecified: Secondary | ICD-10-CM | POA: Diagnosis not present

## 2021-04-10 MED ORDER — FLUTICASONE PROPIONATE 50 MCG/ACT NA SUSP: 1.0000 | Freq: Every day | NASAL | 2 refills | Status: DC

## 2021-04-10 NOTE — Patient Instructions (Signed)
  Semi-occluded vocal tract exercises (SOVTE)  These allow your vocal folds to vibrate without excess tension and promotes high placement of the voice  Use SOVTE as a warm up before prolonged speaking and vocal exercises   High resistance: voicing through a stirring straw  Medium resistance: voicing through a drinking straw  Less resistance: Voiced /v/                            Lip or Tongue Trill                            Nasal "hums" /m/ and /n/                            Vowels /u/ and ee  Watch Vocal Straw Exercises with Ingo Titze on YouTube:  https://www.youtube.com/watch?v=[email or web address]  Pitch Glides for 2 minutes  Accents (siren)  Hum the National Anthem  A goal would be 2-3 minutes several times a day and prior to vocal exercises  As always, use good belly breathing while completing SOVTE  

## 2021-04-10 NOTE — Therapy (Signed)
[facility] [address] [place], [place], [address] Phone: [phone]   Fax:  [phone]  Speech Language Pathology Treatment  Patient Details  Name: Alexandra [name] MRN: [id number] Date of Birth: 1966-05-06 Referring Provider (SLP): Ivonne Andrew, NP   Encounter Date: 04/10/2021   End of Session - 04/10/21 1659     Visit Number 3    Number of Visits 17    Date for SLP Re-Evaluation 05/30/21    Authorization Type BCBS    Authorization Time Period VL combined with PT, OT, ST: 50, 25 used, this is a hard max    SLP Start Time 1702    SLP Stop Time  1745    SLP Time Calculation (min) 43 min    Activity Tolerance Patient tolerated treatment well             History reviewed. No pertinent past medical history.  History reviewed. No pertinent surgical history.  There were no vitals filed for this visit.   Subjective Assessment - 04/10/21 1832     Subjective "I had a coughing episode yesterday while eating cake"    Currently in Pain? No/denies                   ADULT SLP TREATMENT - 04/10/21 1657       General Information   Behavior/Cognition Alert;Cooperative;Pleasant mood      Treatment Provided   Treatment provided Cognitive-Linquistic      Cognitive-Linquistic Treatment   Treatment focused on Voice;Cognition    Skilled Treatment Pt reported coughing episode x1 since last ST session, in which she used albuterol. SLP re-educated use of rescue breath and relaxation technique when coughing episodes occur. Pt verbalized understanding. SLP targeted relaxation techniques and abdominal breathing at rest, in which pt able to perform more effecitvely this session. SLP reviewed SOVT exercises, in which pt able to perform with occasional min A. Abdominal breathing for sentence level targeted with occasional min A required.      Assessment / Recommendations / Plan   Plan Continue with current plan  of care      Progression Toward Goals   Progression toward goals Progressing toward goals              SLP Education - 04/10/21 1721     Education Details SOVT exercises, rescue breath, relaxation techniques    Person(s) Educated Patient    Methods Explanation;Demonstration;Handout    Comprehension Verbalized understanding;Returned demonstration;Need further instruction              SLP Short Term Goals - 04/10/21 1736       SLP SHORT TERM GOAL #1   Title Pt will complete abdominal breathing in isolation with rare min A for 3 minutes over 2 sessions    Baseline 04-10-21    Time 4    Period Weeks    Status On-going    Target Date 05/02/21      SLP SHORT TERM GOAL #2   Title Pt will utilize of abdominal breathing (sniff/blow) for 18/20 sentence responses over 2 sessions    Time 4    Period Weeks    Status On-going    Target Date 05/02/21      SLP SHORT TERM GOAL #3   Title Pt will eliminate throat clears by using 2 throat clear alternatives over 2 weeks    Time 4    Period Weeks    Status On-going  Target Date 05/02/21      SLP SHORT TERM GOAL #4   Title Pt will report 25% reduction of VCD s/sx subjectively with use of learned techniques    Time 4    Period Weeks    Status On-going    Target Date 05/02/21      SLP SHORT TERM GOAL #5   Title Pt will successfully use relaxation techniques outside of ST to reduce cough frequency for 2 or more episodes    Time 4    Period Weeks    Status On-going    Target Date 05/02/21      SLP SHORT TERM GOAL #6   Title Pt will complete formal cognitive linguistic assessment as needed    Time 4    Period Weeks    Status On-going    Target Date 05/02/21              SLP Long Term Goals - 04/07/21 1742       SLP LONG TERM GOAL #1   Title Pt will accurately use abdominal breathing in 20 minute conversation with rare min A over 2 sessions    Time 6    Period Weeks    Status On-going    Target Date 05/16/21       SLP LONG TERM GOAL #2   Title Pt will report 50% reduction of VCD s/sx subjectively with use of learned techniques    Time 8    Period Weeks    Status On-going    Target Date 05/30/21      SLP LONG TERM GOAL #3   Title Pt will improve score of VCD PROM by at least 2 points by last ST session    Baseline eval = 40    Time 8    Period Weeks    Status On-going    Target Date 05/30/21              Plan - 04/10/21 1700     Clinical Impression Statement "Alexandra [name]" was referred for OPST due to chronic cough and memory loss s/p COVID in January 2022. Pt reports coughing epsiode x1 since last ST session, in which pt required albuterol. Pt did not utilize sniff-blow techniques or relaxation to reduce coughing. SLP reviewed recommendations to perform these techniques independently to aid accuracy to be used during coughing episodes occur. Pt has focused on implementing throat clear alternatives. SLP reviewed abdominal breathing and SOVT exercises this session to address change in vocal quality. Skilled ST is warranted to address chronic cough (with suspected VCD) and possibly cognitive communication to maximize pt's return to PLOF.    Speech Therapy Frequency 2x / week    Duration 8 weeks    Treatment/Interventions Compensatory strategies;Functional tasks;Patient/family education;Environmental controls;Cognitive reorganization;Compensatory techniques;Internal/external aids;SLP instruction and feedback    Potential to Achieve Goals Good    SLP Home Exercise Plan provided    Consulted and Agree with Plan of Care Patient             Patient will benefit from skilled therapeutic intervention in order to improve the following deficits and impairments:   Other voice and resonance disorders  Cognitive communication deficit    Problem List Patient Active Problem List   Diagnosis Date Noted   Chronic cough 01/31/2021   History of COVID-19 01/31/2021    Janann Colonel, MA  CCC-SLP 04/10/2021, 6:33 PM  [name] [address] [place], [place], [address]  Phone: [phone]   Fax:  [phone]   Name: Alexandra [name] MRN: [id number] Date of Birth: Mar 20, 1966

## 2021-04-10 NOTE — Patient Instructions (Addendum)
-   flonase every day each nostril after clearing your nose out following a shower. Let me know in 6-8 weeks if this is making any difference through my chart - PFTs in 1-2 weeks - try to avoid your albuterol for 1-2 days before your breathing tests - labs today - see you in 3 months

## 2021-04-11 LAB — CBC WITH DIFFERENTIAL/PLATELET
Basophils Absolute: 0.1 10*3/uL (ref 0.0–0.1)
Basophils Relative: 0.8 % (ref 0.0–3.0)
Eosinophils Absolute: 0.1 10*3/uL (ref 0.0–0.7)
Eosinophils Relative: 0.9 % (ref 0.0–5.0)
HCT: 38.1 % (ref 36.0–46.0)
Hemoglobin: 12.1 g/dL (ref 12.0–15.0)
Lymphocytes Relative: 30.3 % (ref 12.0–46.0)
Lymphs Abs: 2 10*3/uL (ref 0.7–4.0)
MCHC: 31.8 g/dL (ref 30.0–36.0)
MCV: 83.9 fl (ref 78.0–100.0)
Monocytes Absolute: 0.6 10*3/uL (ref 0.1–1.0)
Monocytes Relative: 8.9 % (ref 3.0–12.0)
Neutro Abs: 4 10*3/uL (ref 1.4–7.7)
Neutrophils Relative %: 59.1 % (ref 43.0–77.0)
Platelets: 302 10*3/uL (ref 150.0–400.0)
RBC: 4.55 Mil/uL (ref 3.87–5.11)
RDW: 14.4 % (ref 11.5–15.5)
WBC: 6.7 10*3/uL (ref 4.0–10.5)

## 2021-04-11 LAB — IGE: IgE (Immunoglobulin E), Serum: 66 kU/L (ref <=114)

## 2021-04-11 LAB — BRAIN NATRIURETIC PEPTIDE: Pro B Natriuretic peptide (BNP): 5 pg/mL (ref 0.0–100.0)

## 2021-04-15 ENCOUNTER — Other Ambulatory Visit: Payer: Self-pay

## 2021-04-15 ENCOUNTER — Ambulatory Visit: Payer: Federal, State, Local not specified - PPO

## 2021-04-15 DIAGNOSIS — M549 Dorsalgia, unspecified: Secondary | ICD-10-CM | POA: Diagnosis not present

## 2021-04-15 DIAGNOSIS — M542 Cervicalgia: Secondary | ICD-10-CM | POA: Diagnosis not present

## 2021-04-15 DIAGNOSIS — M545 Low back pain, unspecified: Secondary | ICD-10-CM | POA: Diagnosis not present

## 2021-04-15 DIAGNOSIS — R498 Other voice and resonance disorders: Secondary | ICD-10-CM | POA: Diagnosis not present

## 2021-04-15 DIAGNOSIS — M546 Pain in thoracic spine: Secondary | ICD-10-CM | POA: Diagnosis not present

## 2021-04-15 DIAGNOSIS — R41841 Cognitive communication deficit: Secondary | ICD-10-CM | POA: Diagnosis not present

## 2021-04-16 NOTE — Therapy (Signed)
[facility] [address] [place], [place], [address] Phone: [phone]   Fax:  [phone]  Speech Language Pathology Treatment  Patient Details  Name: Alexandra [name] MRN: [id number] Date of Birth: 03-25-1966 Referring Provider (SLP): Ivonne Andrew, NP   Encounter Date: 04/15/2021   End of Session - 04/15/21 1844     Visit Number 4    Number of Visits 17    Date for SLP Re-Evaluation 05/30/21    Authorization Type BCBS    Authorization Time Period VL combined with PT, OT, ST: 50, 25 used, this is a hard max    SLP Start Time 1808   pt notified therapist of expected late arrival   SLP Stop Time  1838    SLP Time Calculation (min) 30 min    Activity Tolerance Patient tolerated treatment well             History reviewed. No pertinent past medical history.  History reviewed. No pertinent surgical history.  There were no vitals filed for this visit.   Subjective Assessment - 04/16/21 0849     Subjective "getting better"    Currently in Pain? No/denies                   ADULT SLP TREATMENT - 04/15/21 1833       General Information   Behavior/Cognition Alert;Cooperative;Pleasant mood      Treatment Provided   Treatment provided Cognitive-Linquistic      Cognitive-Linquistic Treatment   Treatment focused on Voice;Cognition    Skilled Treatment Pt indicated some mild improvements. Pt endorsed she still has a cough (single occurances most often, mostly occurs at work). No coughing episodes or attacks reported. Rare coughing reported at therapy sessions and at home. Pt is currently c/o thick secretions, which do not clear with learned throat clear alternatives. Pt has intermittently been working on abdominal breathing, relaxation techniques, and SOVT. Per pt, SOVT seemingly causes increase in coughing; however, voice seems "better" as it is more consistent with less cracking. Pt reports  humidifier seems effective. Pt reported two recent negative CXR. Reflux Symptom Index (RSI) completed this session given s/sx of acid reflux indicated. RSI score was 29 (RSI greater than or equal to 13 is clinically significant and may be indicative of significant reflux disease). SLP recommended follow up with MD re: acid reflux managment and possible ENT consultation. Behavioral reflux managment handout to be educated and provided next session.      Assessment / Recommendations / Plan   Plan Continue with current plan of care      Progression Toward Goals   Progression toward goals Progressing toward goals              SLP Education - 04/16/21 0856     Education Details RSI score and indications    Person(s) Educated Patient    Methods Explanation;Demonstration;Handout    Comprehension Verbalized understanding;Returned demonstration;Need further instruction              SLP Short Term Goals - 04/15/21 1849       SLP SHORT TERM GOAL #1   Title Pt will complete abdominal breathing in isolation with rare min A for 3 minutes over 2 sessions    Baseline 04-10-21    Time 4    Period Weeks    Status On-going    Target Date 05/02/21      SLP SHORT TERM GOAL #2   Title Pt  will utilize of abdominal breathing (sniff/blow) for 18/20 sentence responses over 2 sessions    Time 4    Period Weeks    Status On-going    Target Date 05/02/21      SLP SHORT TERM GOAL #3   Title Pt will eliminate throat clears by using 2 throat clear alternatives over 2 weeks    Time 4    Period Weeks    Status On-going    Target Date 05/02/21      SLP SHORT TERM GOAL #4   Title Pt will report 25% reduction of VCD s/sx subjectively with use of learned techniques    Time 4    Period Weeks    Status On-going    Target Date 05/02/21      SLP SHORT TERM GOAL #5   Title Pt will successfully use relaxation techniques outside of ST to reduce cough frequency for 2 or more episodes    Time 4     Period Weeks    Status On-going    Target Date 05/02/21      SLP SHORT TERM GOAL #6   Title Pt will complete formal cognitive linguistic assessment as needed    Time 4    Period Weeks    Status On-going    Target Date 05/02/21              SLP Long Term Goals - 04/15/21 1849       SLP LONG TERM GOAL #1   Title Pt will accurately use abdominal breathing in 20 minute conversation with rare min A over 2 sessions    Time 6    Period Weeks    Status On-going    Target Date 05/16/21      SLP LONG TERM GOAL #2   Title Pt will report 50% reduction of VCD s/sx subjectively with use of learned techniques    Time 8    Period Weeks    Status On-going    Target Date 05/30/21      SLP LONG TERM GOAL #3   Title Pt will improve score of VCD PROM by at least 2 points by last ST session    Baseline eval = 40    Time 8    Period Weeks    Status On-going    Target Date 05/30/21              Plan - 04/15/21 1844     Clinical Impression Statement "Alexandra [name]" was referred for OPST due to chronic cough and memory loss s/p COVID in January 2022. Pt reports occasional single coughs and no significant coughing episodes since last ST session. SLP reviewed recommendations, in which pt has been implementing intermittently. Reflux Symptom Index (RSI) completed with score of 29, with score of 13 may be indicative of reflux. Further education re: behavioral management of reflux needed. Skilled ST is warranted to address chronic cough (with suspected VCD) and possibly cognitive communication to maximize pt's return to PLOF.    Speech Therapy Frequency 2x / week    Duration 8 weeks    Treatment/Interventions Compensatory strategies;Functional tasks;Patient/family education;Environmental controls;Cognitive reorganization;Compensatory techniques;Internal/external aids;SLP instruction and feedback    Potential to Achieve Goals Good    SLP Home Exercise Plan provided    Consulted and Agree with Plan of  Care Patient             Patient will benefit from skilled therapeutic intervention in order to improve the following deficits and  impairments:   Other voice and resonance disorders    Problem List Patient Active Problem List   Diagnosis Date Noted   Chronic cough 01/31/2021   History of COVID-19 01/31/2021    Janann Colonel, MA CCC-SLP 04/16/2021, 8:59 AM  [facility] [address] [place], [place], [address] Phone: [phone]   Fax:  [phone]   Name: Alexandra [name] MRN: [id number] Date of Birth: 06-07-1966

## 2021-04-17 ENCOUNTER — Ambulatory Visit: Payer: Federal, State, Local not specified - PPO

## 2021-04-17 ENCOUNTER — Other Ambulatory Visit: Payer: Self-pay

## 2021-04-17 DIAGNOSIS — M545 Low back pain, unspecified: Secondary | ICD-10-CM | POA: Diagnosis not present

## 2021-04-17 DIAGNOSIS — M549 Dorsalgia, unspecified: Secondary | ICD-10-CM | POA: Diagnosis not present

## 2021-04-17 DIAGNOSIS — R41841 Cognitive communication deficit: Secondary | ICD-10-CM | POA: Diagnosis not present

## 2021-04-17 DIAGNOSIS — R498 Other voice and resonance disorders: Secondary | ICD-10-CM | POA: Diagnosis not present

## 2021-04-17 DIAGNOSIS — M546 Pain in thoracic spine: Secondary | ICD-10-CM | POA: Diagnosis not present

## 2021-04-17 DIAGNOSIS — M542 Cervicalgia: Secondary | ICD-10-CM | POA: Diagnosis not present

## 2021-04-17 NOTE — Patient Instructions (Addendum)
Coughing during/after meals (date, type foods/drink, what you felt, coughing/throat clearing)               Slow rate, chew well, multiple swallows, bite/sip/bite/sip

## 2021-04-17 NOTE — Therapy (Signed)
[facility] [address] [place], [place], [address] Phone: [phone]   Fax:  [phone]  Speech Language Pathology Treatment  Patient Details  Name: Alexandra [name] MRN: [id number] Date of Birth: [birth year] Referring Provider (SLP): Ivonne Andrew, NP   Encounter Date: 04/17/2021   End of Session - 04/17/21 1754     Visit Number 5    Number of Visits 17    Date for SLP Re-Evaluation 05/30/21    Authorization Type BCBS    Authorization Time Period VL combined with PT, OT, ST: 50, 25 used, this is a hard max    SLP Start Time 1512    SLP Stop Time  1551    SLP Time Calculation (min) 39 min    Activity Tolerance Patient tolerated treatment well             History reviewed. No pertinent past medical history.  History reviewed. No pertinent surgical history.  There were no vitals filed for this visit.   Subjective Assessment - 04/17/21 1713     Subjective "It's been busy today"    Currently in Pain? No/denies                   ADULT SLP TREATMENT - 04/17/21 1715       General Information   Behavior/Cognition Alert;Cooperative;Pleasant mood      Treatment Provided   Treatment provided Cognitive-Linquistic      Cognitive-Linquistic Treatment   Treatment focused on Voice;Cognition    Skilled Treatment Pt reports less coughing since last ST session (pt reported coughing mid-day at work). Pt endorsed humidifer has been helpful. Less thick sputum also reported since last ST session. Some success with abdominal breathing also indicated. During review of RSI score, pt indicating some trouble swallowing. Pt indicated some trouble with dry, crumbling textures. SLP briefly assessed PO trials with endorsement of pharyngeal residue x1, despite use of usual alternation of liquids/solids and multiple swallows exhibited. SLP cued abdominal breathing and relaxation techniques to reduce coughing episode,  as persistent residue endorsed. Liquid washes were ineffective. Pt to track swallowing over next week to determine if instrumental swallow study warranted.      Assessment / Recommendations / Plan   Plan Continue with current plan of care; may need to further assess swallowing     Progression Toward Goals   Progression toward goals Progressing toward goals              SLP Education - 04/17/21 1754     Education Details reflux precautions, swallow precautions    Person(s) Educated Patient    Methods Explanation;Demonstration;Handout    Comprehension Verbalized understanding;Returned demonstration;Need further instruction              SLP Short Term Goals - 04/17/21 1756       SLP SHORT TERM GOAL #1   Title Pt will complete abdominal breathing in isolation with rare min A for 3 minutes over 2 sessions    Baseline 04-10-21    Time 4    Period Weeks    Status On-going    Target Date 05/02/21      SLP SHORT TERM GOAL #2   Title Pt will utilize of abdominal breathing (sniff/blow) for 18/20 sentence responses over 2 sessions    Time 4    Period Weeks    Status On-going    Target Date 05/02/21      SLP SHORT TERM GOAL #3  Title Pt will eliminate throat clears by using 2 throat clear alternatives over 2 weeks    Time 4    Period Weeks    Status On-going    Target Date 05/02/21      SLP SHORT TERM GOAL #4   Title Pt will report 25% reduction of VCD s/sx subjectively with use of learned techniques    Time 4    Period Weeks    Status On-going    Target Date 05/02/21      SLP SHORT TERM GOAL #5   Title Pt will successfully use relaxation techniques outside of ST to reduce cough frequency for 2 or more episodes    Time 4    Period Weeks    Status On-going    Target Date 05/02/21      SLP SHORT TERM GOAL #6   Title Pt will complete formal cognitive linguistic assessment as needed    Time 4    Period Weeks    Status On-going    Target Date 05/02/21               SLP Long Term Goals - 04/17/21 1757       SLP LONG TERM GOAL #1   Title Pt will accurately use abdominal breathing in 20 minute conversation with rare min A over 2 sessions    Time 6    Period Weeks    Status On-going    Target Date 05/16/21      SLP LONG TERM GOAL #2   Title Pt will report 50% reduction of VCD s/sx subjectively with use of learned techniques    Time 8    Period Weeks    Status On-going    Target Date 05/30/21      SLP LONG TERM GOAL #3   Title Pt will improve score of VCD PROM by at least 2 points by last ST session    Baseline eval = 40    Time 8    Period Weeks    Status On-going    Target Date 05/30/21              Plan - 04/17/21 1755     Clinical Impression Statement "Alexandra [name]" was referred for OPST due to chronic cough and memory loss s/p COVID in January 2022. Pt reports less frequent coughing with no significant coughing episodes since last ST session. SLP provided education re: behavioral management of reflux. Due to swallowing difficulty reported on RSI last session, SLP conducted PO trials resulting in delayed coughing and sensation of pharyngeal residue endorsed. Pt may warrant any objective swallow study. Skilled ST is warranted to address chronic cough (with suspected VCD) and possibly cognitive communication to maximize pt's return to PLOF.    Speech Therapy Frequency 2x / week    Duration 8 weeks    Treatment/Interventions Compensatory strategies;Functional tasks;Patient/family education;Environmental controls;Cognitive reorganization;Compensatory techniques;Internal/external aids;SLP instruction and feedback    Potential to Achieve Goals Good    SLP Home Exercise Plan provided    Consulted and Agree with Plan of Care Patient             Patient will benefit from skilled therapeutic intervention in order to improve the following deficits and impairments:   Other voice and resonance disorders    Problem List Patient  Active Problem List   Diagnosis Date Noted   Chronic cough 01/31/2021   History of COVID-19 01/31/2021    Janann Colonel, MA CCC-SLP 04/17/2021, 5:57 PM  [facility] [address] [place], [place], [address] Phone: [phone]   Fax:  [phone]   Name: Alexandra [name] MRN: [id number] Date of Birth: 02-02-1966

## 2021-04-24 ENCOUNTER — Ambulatory Visit (INDEPENDENT_AMBULATORY_CARE_PROVIDER_SITE_OTHER): Payer: Federal, State, Local not specified - PPO | Admitting: Student

## 2021-04-24 ENCOUNTER — Ambulatory Visit: Payer: Federal, State, Local not specified - PPO | Attending: Nurse Practitioner

## 2021-04-24 ENCOUNTER — Other Ambulatory Visit: Payer: Self-pay

## 2021-04-24 DIAGNOSIS — R41841 Cognitive communication deficit: Secondary | ICD-10-CM

## 2021-04-24 DIAGNOSIS — R498 Other voice and resonance disorders: Secondary | ICD-10-CM | POA: Diagnosis not present

## 2021-04-24 DIAGNOSIS — M549 Dorsalgia, unspecified: Secondary | ICD-10-CM | POA: Diagnosis not present

## 2021-04-24 DIAGNOSIS — M545 Low back pain, unspecified: Secondary | ICD-10-CM | POA: Diagnosis not present

## 2021-04-24 DIAGNOSIS — R0609 Other forms of dyspnea: Secondary | ICD-10-CM

## 2021-04-24 DIAGNOSIS — M546 Pain in thoracic spine: Secondary | ICD-10-CM | POA: Diagnosis not present

## 2021-04-24 DIAGNOSIS — M542 Cervicalgia: Secondary | ICD-10-CM | POA: Diagnosis not present

## 2021-04-24 LAB — PULMONARY FUNCTION TEST
DL/VA % pred: 132 %
DL/VA: 5.86 ml/min/mmHg/L
DLCO cor % pred: 109 %
DLCO cor: 19.83 ml/min/mmHg
DLCO unc % pred: 109 %
DLCO unc: 19.83 ml/min/mmHg
FEF 25-75 Post: 1.93 L/sec
FEF 25-75 Pre: 1.9 L/sec
FEF2575-%Change-Post: 1 %
FEF2575-%Pred-Post: 93 %
FEF2575-%Pred-Pre: 92 %
FEV1-%Change-Post: 0 %
FEV1-%Pred-Post: 96 %
FEV1-%Pred-Pre: 97 %
FEV1-Post: 1.85 L
FEV1-Pre: 1.85 L
FEV1FVC-%Change-Post: -2 %
FEV1FVC-%Pred-Pre: 104 %
FEV6-%Change-Post: 2 %
FEV6-%Pred-Post: 96 %
FEV6-%Pred-Pre: 94 %
FEV6-Post: 2.24 L
FEV6-Pre: 2.19 L
FEV6FVC-%Pred-Post: 103 %
FEV6FVC-%Pred-Pre: 103 %
FVC-%Change-Post: 2 %
FVC-%Pred-Post: 93 %
FVC-%Pred-Pre: 91 %
FVC-Post: 2.24 L
FVC-Pre: 2.19 L
Post FEV1/FVC ratio: 83 %
Post FEV6/FVC ratio: 100 %
Pre FEV1/FVC ratio: 85 %
Pre FEV6/FVC Ratio: 100 %
RV % pred: 111 %
RV: 1.84 L
TLC % pred: 92 %
TLC: 4.11 L

## 2021-04-24 NOTE — Progress Notes (Signed)
Full PFT performed today. °

## 2021-04-24 NOTE — Patient Instructions (Signed)
Full PFT performed today. °

## 2021-04-24 NOTE — Therapy (Signed)
[facility] [address] [place], [place], [address] Phone: [phone]   Fax:  [phone]  Speech Language Pathology Treatment  Patient Details  Name: Alexandra [name] MRN: [id number] Date of Birth: 02-17-1966 Referring Provider (SLP): Ivonne Andrew, NP   Encounter Date: 04/24/2021   End of Session - 04/24/21 1704     Visit Number 6    Number of Visits 17    Date for SLP Re-Evaluation 05/30/21    Authorization Type BCBS    Authorization Time Period VL combined with PT, OT, ST: 50, 25 used, this is a hard max    SLP Start Time 1505    SLP Stop Time  1545    SLP Time Calculation (min) 40 min    Activity Tolerance Patient tolerated treatment well             History reviewed. No pertinent past medical history.  History reviewed. No pertinent surgical history.  There were no vitals filed for this visit.   Subjective Assessment - 04/24/21 1708     Subjective "the coughing is getting much better"    Currently in Pain? No/denies                   ADULT SLP TREATMENT - 04/24/21 1704       General Information   Behavior/Cognition Alert;Cooperative;Pleasant mood      Treatment Provided   Treatment provided Cognitive-Linquistic      Cognitive-Linquistic Treatment   Treatment focused on Voice;Cognition    Skilled Treatment Pt tracked coughing over past week, in which pt stated "coughing is getting much better." Cough notably occured more frequently in the morning. Pt did use breathing/relaxation technique when coughing occured, which was effective. Pt reports no further swallowing difficulty, which pt suspects previous difficulty was related to dry foods/throat. Pt does typically drink before meals at baseline. Pt has increased fluid intake per SLP recommendation. CLQT initiated this session, with overt difficulty problem solving errors on clock drawing. Remainder to be completed next ST session.       Assessment / Recommendations / Plan   Plan Continue with current plan of care      Progression Toward Goals   Progression toward goals Progressing toward goals              SLP Education - 04/24/21 2012     Education Details CLQT results    Person(s) Educated Patient    Methods Explanation;Demonstration    Comprehension Verbalized understanding;Returned demonstration;Need further instruction              SLP Short Term Goals - 04/24/21 1705       SLP SHORT TERM GOAL #1   Title Pt will complete abdominal breathing in isolation with rare min A for 3 minutes over 2 sessions    Baseline 04-10-21    Time 4    Period Weeks    Status On-going    Target Date 05/02/21      SLP SHORT TERM GOAL #2   Title Pt will utilize of abdominal breathing (sniff/blow) for 18/20 sentence responses over 2 sessions    Time 4    Period Weeks    Status On-going    Target Date 05/02/21      SLP SHORT TERM GOAL #3   Title Pt will eliminate throat clears by using 2 throat clear alternatives over 2 weeks    Time 4    Period Weeks  Status On-going    Target Date 05/02/21      SLP SHORT TERM GOAL #4   Title Pt will report 25% reduction of VCD s/sx subjectively with use of learned techniques    Time 4    Period Weeks    Status On-going    Target Date 05/02/21      SLP SHORT TERM GOAL #5   Title Pt will successfully use relaxation techniques outside of ST to reduce cough frequency for 2 or more episodes    Baseline 04-24-21    Time 4    Period Weeks    Status On-going    Target Date 05/02/21      SLP SHORT TERM GOAL #6   Title Pt will complete formal cognitive linguistic assessment as needed    Time --    Period --    Status Achieved    Target Date 05/02/21              SLP Long Term Goals - 04/24/21 1705       SLP LONG TERM GOAL #1   Title Pt will accurately use abdominal breathing in 20 minute conversation with rare min A over 2 sessions    Time 6    Period  Weeks    Status On-going    Target Date 05/16/21      SLP LONG TERM GOAL #2   Title Pt will report 50% reduction of VCD s/sx subjectively with use of learned techniques    Time 8    Period Weeks    Status On-going    Target Date 05/30/21      SLP LONG TERM GOAL #3   Title Pt will improve score of VCD PROM by at least 2 points by last ST session    Baseline eval = 40    Time 8    Period Weeks    Status On-going    Target Date 05/30/21              Plan - 04/24/21 1705     Clinical Impression Statement "Alexandra [name]" was referred for OPST due to chronic cough and memory loss s/p COVID in January 2022. Pt reports less frequent coughing with no significant coughing episodes since last ST session. Pt reports no further swallowing difficulty. CLQT initiated this session, with overt difficulty with problem solving for clock drawing. Remainder to be completed next session. Skilled ST is warranted to address chronic cough (with suspected VCD) and possibly cognitive communication to maximize pt's return to PLOF.    Speech Therapy Frequency 2x / week    Duration 8 weeks    Treatment/Interventions Compensatory strategies;Functional tasks;Patient/family education;Environmental controls;Cognitive reorganization;Compensatory techniques;Internal/external aids;SLP instruction and feedback    Potential to Achieve Goals Good    SLP Home Exercise Plan provided    Consulted and Agree with Plan of Care Patient             Patient will benefit from skilled therapeutic intervention in order to improve the following deficits and impairments:   Other voice and resonance disorders  Cognitive communication deficit    Problem List Patient Active Problem List   Diagnosis Date Noted   Chronic cough 01/31/2021   History of COVID-19 01/31/2021    Janann Colonel, MA CCC-SLP 04/24/2021, 8:15 PM  [facility] [address] [place], [place], [address] Phone: [phone]   Fax:  [phone]   Name: Alexandra [name] MRN: [id number] Date of  Birth: 1966/02/03

## 2021-05-01 DIAGNOSIS — R053 Chronic cough: Secondary | ICD-10-CM

## 2021-05-01 NOTE — Telephone Encounter (Signed)
Please advise on pt email, thank you.     Good Afternoon, I have reviewed and see that all of my test results are normal. Just wondering why I still do not have any reserve and why this cough is lingering. Any exertion and I am worn out and taking time to recover.   Update: The Flonase is working well. I do feel better using it, I use it at night and far less coughing in the morning time. The cough seems to be decreasing but is still there. The last time I used the albuterol inhaler was this past Tuesday. Started back using the humidifier at home which seems to be helping as well.

## 2021-05-06 NOTE — Telephone Encounter (Signed)
NM please see the pts email.   Thanks

## 2021-05-08 ENCOUNTER — Ambulatory Visit: Payer: Federal, State, Local not specified - PPO

## 2021-05-08 ENCOUNTER — Other Ambulatory Visit: Payer: Self-pay

## 2021-05-08 DIAGNOSIS — Z0000 Encounter for general adult medical examination without abnormal findings: Secondary | ICD-10-CM | POA: Diagnosis not present

## 2021-05-08 DIAGNOSIS — R41841 Cognitive communication deficit: Secondary | ICD-10-CM

## 2021-05-08 DIAGNOSIS — R498 Other voice and resonance disorders: Secondary | ICD-10-CM | POA: Diagnosis not present

## 2021-05-08 DIAGNOSIS — M9901 Segmental and somatic dysfunction of cervical region: Secondary | ICD-10-CM | POA: Diagnosis not present

## 2021-05-08 DIAGNOSIS — Z13220 Encounter for screening for lipoid disorders: Secondary | ICD-10-CM | POA: Diagnosis not present

## 2021-05-08 DIAGNOSIS — M25552 Pain in left hip: Secondary | ICD-10-CM | POA: Diagnosis not present

## 2021-05-08 DIAGNOSIS — M9905 Segmental and somatic dysfunction of pelvic region: Secondary | ICD-10-CM | POA: Diagnosis not present

## 2021-05-08 DIAGNOSIS — M50322 Other cervical disc degeneration at C5-C6 level: Secondary | ICD-10-CM | POA: Diagnosis not present

## 2021-05-08 NOTE — Patient Instructions (Addendum)
Constant Therapy - free for 14 days

## 2021-05-08 NOTE — Therapy (Signed)
[facility] [address] [place], [place], [address] Phone: [phone]   Fax:  [phone]  Speech Language Pathology Treatment  Patient Details  Name: Alexandra [name] MRN: [id number] Date of Birth: 05/28/1966 Referring Provider (SLP): Fenton Foy, NP   Encounter Date: 05/08/2021   End of Session - 05/08/21 1711     Visit Number 7    Number of Visits 17    Date for SLP Re-Evaluation 05/30/21    Authorization Type BCBS    SLP Start Time 9373   pt arrived late   SLP Stop Time  1530    SLP Time Calculation (min) 37 min    Activity Tolerance Patient tolerated treatment well             History reviewed. No pertinent past medical history.  History reviewed. No pertinent surgical history.  There were no vitals filed for this visit.   Subjective Assessment - 05/08/21 1455     Subjective "it's not as bad as it was"    Currently in Pain? No/denies                   ADULT SLP TREATMENT - 05/08/21 1454       General Information   Behavior/Cognition Alert;Cooperative;Pleasant mood      Treatment Provided   Treatment provided Cognitive-Linquistic      Cognitive-Linquistic Treatment   Treatment focused on Voice;Cognition    Skilled Treatment Pt reports coughing "waxes and wanes" but have improved overall. Pt has utilized inhaler x2 since last ST visit (once for coughing). Pt continues to c/o thick sputum at back of throat, which is difficult to clear. Pt uses throat clears when thick sputum occurs. SLP continues to recommended abdominal breathing and relaxation techniques when coughing occurs, which pt indicates she does intermittently. CLQT completed this session, with scores WNL. Pt exhibited significant difficulty with clock drawing and some difficulty with design generation indicating impaired executive functioning. SLP provided HEP to target problem solving. Pt reported rare epsiodes in which  she is unable to recall known information (ex: grandson's name).      Assessment / Recommendations / Plan   Plan Continue with current plan of care      Progression Toward Goals   Progression toward goals Progressing toward goals              SLP Education - 05/08/21 1509     Education Details CLQT results, Constant Therapy, HEP    Person(s) Educated Patient    Methods Explanation;Demonstration    Comprehension Verbalized understanding;Returned demonstration;Need further instruction              SLP Short Term Goals - 05/08/21 1716       SLP SHORT TERM GOAL #1   Title Pt will complete abdominal breathing in isolation with rare min A for 3 minutes over 2 sessions    Baseline 04-10-21, 05-08-21    Time 4    Period Weeks    Status Achieved    Target Date 05/02/21      SLP SHORT TERM GOAL #2   Title Pt will utilize of abdominal breathing (sniff/blow) for 18/20 sentence responses over 2 sessions    Time 4    Period Weeks    Status Not Met    Target Date 05/02/21      SLP SHORT TERM GOAL #3   Title Pt will eliminate throat clears by using 2 throat clear alternatives over  2 weeks    Time 4    Period Weeks    Status Partially Met    Target Date 05/02/21      SLP SHORT TERM GOAL #4   Title Pt will report 25% reduction of VCD s/sx subjectively with use of learned techniques    Time 4    Period Weeks    Status Partially Met    Target Date 05/02/21      SLP SHORT TERM GOAL #5   Title Pt will successfully use relaxation techniques outside of ST to reduce cough frequency for 2 or more episodes    Baseline 04-24-21    Time 4    Period Weeks    Status Partially Met    Target Date 05/02/21      SLP SHORT TERM GOAL #6   Title Pt will complete formal cognitive linguistic assessment as needed    Status Achieved    Target Date 05/02/21              SLP Long Term Goals - 05/08/21 1716       SLP LONG TERM GOAL #1   Title Pt will accurately use abdominal  breathing in 20 minute conversation with rare min A over 2 sessions    Time 6    Period Weeks    Status On-going    Target Date 05/16/21      SLP LONG TERM GOAL #2   Title Pt will report 50% reduction of VCD s/sx subjectively with use of learned techniques    Time 8    Period Weeks    Status On-going    Target Date 05/30/21      SLP LONG TERM GOAL #3   Title Pt will improve score of VCD PROM by at least 2 points by last ST session    Baseline eval = 40    Time 8    Period Weeks    Status On-going    Target Date 05/30/21              Plan - 05/08/21 1712     Clinical Impression Statement "Alexandra [name]" was referred for OPST due to chronic cough and memory loss s/p COVID in January 2022. Pt reports inconsistent but overall less frequent coughing. Pt continues to use VCD recommendations intermittently. CLQT completed this session, with overt difficulty exhibited for executive functioning on clock drawing and design generation tasks. Skilled ST is warranted to address chronic cough (with suspected VCD) and cognitive communication to maximize pt's return to PLOF.    Speech Therapy Frequency 2x / week    Duration 8 weeks    Treatment/Interventions Compensatory strategies;Functional tasks;Patient/family education;Environmental controls;Cognitive reorganization;Compensatory techniques;Internal/external aids;SLP instruction and feedback    Potential to Achieve Goals Good    SLP Home Exercise Plan provided    Consulted and Agree with Plan of Care Patient             Patient will benefit from skilled therapeutic intervention in order to improve the following deficits and impairments:   Cognitive communication deficit  Other voice and resonance disorders    Problem List Patient Active Problem List   Diagnosis Date Noted   Chronic cough 01/31/2021   History of COVID-19 01/31/2021    Alinda Deem, MA CCC-SLP 05/08/2021, 5:17 PM  [facility] [address] [place], [place], [address] Phone: [phone]   Fax:  [phone]   Name: Alexandra [name] MRN: [id number] Date  of Birth: 21-Sep-1965

## 2021-05-15 ENCOUNTER — Ambulatory Visit: Payer: Federal, State, Local not specified - PPO

## 2021-05-15 DIAGNOSIS — M9905 Segmental and somatic dysfunction of pelvic region: Secondary | ICD-10-CM | POA: Diagnosis not present

## 2021-05-15 DIAGNOSIS — M25552 Pain in left hip: Secondary | ICD-10-CM | POA: Diagnosis not present

## 2021-05-15 DIAGNOSIS — M9901 Segmental and somatic dysfunction of cervical region: Secondary | ICD-10-CM | POA: Diagnosis not present

## 2021-05-15 DIAGNOSIS — M50322 Other cervical disc degeneration at C5-C6 level: Secondary | ICD-10-CM | POA: Diagnosis not present

## 2021-05-16 DIAGNOSIS — E6609 Other obesity due to excess calories: Secondary | ICD-10-CM | POA: Diagnosis not present

## 2021-05-16 DIAGNOSIS — R059 Cough, unspecified: Secondary | ICD-10-CM | POA: Diagnosis not present

## 2021-05-16 DIAGNOSIS — Z0000 Encounter for general adult medical examination without abnormal findings: Secondary | ICD-10-CM | POA: Diagnosis not present

## 2021-05-16 DIAGNOSIS — G4733 Obstructive sleep apnea (adult) (pediatric): Secondary | ICD-10-CM | POA: Diagnosis not present

## 2021-05-16 DIAGNOSIS — M549 Dorsalgia, unspecified: Secondary | ICD-10-CM | POA: Diagnosis not present

## 2021-05-16 DIAGNOSIS — M21621 Bunionette of right foot: Secondary | ICD-10-CM | POA: Diagnosis not present

## 2021-05-16 DIAGNOSIS — M7671 Peroneal tendinitis, right leg: Secondary | ICD-10-CM | POA: Diagnosis not present

## 2021-05-22 ENCOUNTER — Ambulatory Visit: Payer: Federal, State, Local not specified - PPO | Attending: Nurse Practitioner

## 2021-05-29 ENCOUNTER — Ambulatory Visit (HOSPITAL_COMMUNITY)
Admission: RE | Admit: 2021-05-29 | Discharge: 2021-05-29 | Disposition: A | Discharge status: Home / Self Care | Payer: Federal, State, Local not specified - PPO | Source: Ambulatory Visit | Attending: Student | Admitting: Student

## 2021-05-29 ENCOUNTER — Other Ambulatory Visit: Payer: Self-pay

## 2021-05-29 DIAGNOSIS — R053 Chronic cough: Secondary | ICD-10-CM | POA: Diagnosis not present

## 2021-05-29 LAB — PULMONARY FUNCTION TEST
FEF 25-75 Post: 2.55 L/sec
FEF 25-75 Pre: 2.84 L/sec
FEF2575-%Change-Post: -9 %
FEF2575-%Pred-Post: 123 %
FEF2575-%Pred-Pre: 137 %
FEV1-%Change-Post: -1 %
FEV1-%Pred-Post: 100 %
FEV1-%Pred-Pre: 101 %
FEV1-Post: 1.91 L
FEV1-Pre: 1.94 L
FEV1FVC-%Change-Post: -2 %
FEV1FVC-%Pred-Pre: 110 %
FEV6-%Change-Post: 0 %
FEV6-%Pred-Post: 94 %
FEV6-%Pred-Pre: 93 %
FEV6-Post: 2.19 L
FEV6-Pre: 2.17 L
FEV6FVC-%Pred-Post: 103 %
FEV6FVC-%Pred-Pre: 103 %
FVC-%Change-Post: 0 %
FVC-%Pred-Post: 91 %
FVC-%Pred-Pre: 90 %
FVC-Post: 2.19 L
FVC-Pre: 2.17 L
Post FEV1/FVC ratio: 87 %
Post FEV6/FVC ratio: 100 %
Pre FEV1/FVC ratio: 89 %
Pre FEV6/FVC Ratio: 100 %

## 2021-05-29 MED ORDER — METHACHOLINE 16 MG/ML NEB SOLN
3.0000 mL | Freq: Once | RESPIRATORY_TRACT | Status: AC
  Administered 2021-05-29: 48 mg via RESPIRATORY_TRACT
  Filled 2021-05-29: qty 3

## 2021-05-29 MED ORDER — METHACHOLINE 0.25 MG/ML NEB SOLN
3.0000 mL | Freq: Once | RESPIRATORY_TRACT | Status: AC
  Administered 2021-05-29: 0.75 mg via RESPIRATORY_TRACT
  Filled 2021-05-29: qty 3

## 2021-05-29 MED ORDER — METHACHOLINE 4 MG/ML NEB SOLN
3.0000 mL | Freq: Once | RESPIRATORY_TRACT | Status: AC
  Administered 2021-05-29: 12 mg via RESPIRATORY_TRACT
  Filled 2021-05-29: qty 3

## 2021-05-29 MED ORDER — METHACHOLINE 0 MG/ML NEB SOLN
3.0000 mL | Freq: Once | RESPIRATORY_TRACT | Status: AC
  Administered 2021-05-29: 3 mL via RESPIRATORY_TRACT
  Filled 2021-05-29: qty 3

## 2021-05-29 MED ORDER — METHACHOLINE 1 MG/ML NEB SOLN
3.0000 mL | Freq: Once | RESPIRATORY_TRACT | Status: AC
  Administered 2021-05-29: 3 mg via RESPIRATORY_TRACT
  Filled 2021-05-29: qty 3

## 2021-05-29 MED ORDER — METHACHOLINE 0.0625 MG/ML NEB SOLN
3.0000 mL | Freq: Once | RESPIRATORY_TRACT | Status: AC
  Administered 2021-05-29: 0.0625 mg via RESPIRATORY_TRACT
  Filled 2021-05-29: qty 3

## 2021-05-29 MED ORDER — ALBUTEROL SULFATE (2.5 MG/3ML) 0.083% IN NEBU
2.5000 mg | INHALATION_SOLUTION | Freq: Once | RESPIRATORY_TRACT | Status: AC
  Administered 2021-05-29: 2.5 mg via RESPIRATORY_TRACT

## 2021-06-26 DIAGNOSIS — M7671 Peroneal tendinitis, right leg: Secondary | ICD-10-CM | POA: Diagnosis not present

## 2021-07-03 ENCOUNTER — Ambulatory Visit: Payer: Federal, State, Local not specified - PPO | Admitting: Student

## 2021-07-10 DIAGNOSIS — G4733 Obstructive sleep apnea (adult) (pediatric): Secondary | ICD-10-CM | POA: Diagnosis not present

## 2021-07-16 NOTE — Progress Notes (Signed)
Synopsis: Referred for cough by Deatra James, MD  Subjective:   PATIENT ID: Alexandra [name] GENDER: female DOB: February 21, 1966, MRN: [id number]  Chief Complaint  Patient presents with   Follow-up    3 mo f/u after PFTs. States she still has a cough but it has improved.    55yF with history of covid-19 infection 07/2020, cough/VCD followed by speech. She was vaccinated with pfizer and booster. Never had asthma as a kid. Never has ahd sinus surgery  She was not hospitalized but was out of work for [employer] dealing with it. Never got any specific treatment for it.   She has cough that is sometimes dry, sometimes productive of clearish sputum. SHe is seeing speech but they haven't done FNL. She doesn't have accompanying throat tightness but she does have associated hoarseness. Gets occasional chest tightness which is improved with albuterol. She periodically has PND but it is getting better (2-3x per week still). She does have some sinonasal congestion.  She has no reflux/heartburn symptoms.   She does think a course of prednisone imrpoved her cough when she got it.  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 She has no exposure to dusts/solvents/particulates without mask other than occasional candle-making. She has a dog. No pet bird. No hot tub. Never has lived outside of [place].  She never smoked, vaped.   Interval HPI: Negative methacholine challenge in November. She says she thinks she is better but not fully improved. Flonase has improved things somewhat. Can't eat dry foods anymore without coughing. No trouble swallowing but does need a wh ole lot of water.   Otherwise pertinent review of systems is negative.    No past medical history on file.   Family History  Problem Relation Age of Onset   Breast cancer Mother    Breast cancer Maternal Aunt      No past surgical history on  file.  Social History   Socioeconomic History   Marital status: Married    Spouse name: Not on file   Number of children: Not on file   Years of education: Not on file   Highest education level: Not on file  Occupational History   Not on file  Tobacco Use   Smoking status: Never   Smokeless tobacco: Never  Substance and Sexual Activity   Alcohol use: Yes   Drug use: Never   Sexual activity: Not on file  Other Topics Concern   Not on file  Social History Narrative   Not on file   Social Determinants of Health   Financial Resource Strain: Not on file  Food Insecurity: Not on file  Transportation Needs: Not on file  Physical Activity: Not on file  Stress: Not on file  Social Connections: Not on file  Intimate Partner Violence: Not on file     Allergies  Allergen Reactions   Penicillins Swelling    Patient reports he tongue swelled.     Outpatient Medications Prior to Visit  Medication Sig Dispense Refill   albuterol (VENTOLIN HFA) 108 (90 Base) MCG/ACT inhaler SMARTSIG:1 Puff(s) By Mouth Every 4 Hours PRN     AMBULATORY NON FORMULARY MEDICATION Full Cranial Prosthesis: A92.82 12 applicator 0   fluticasone (FLONASE) 50 MCG/ACT nasal spray Place 1 spray into both nostrils daily. 16 g 2   tacrolimus (PROTOPIC) 0.1 % ointment Apply 1  application topically daily. Patient only wants brand. Does not want generic 100 g 8   vitamin B-12 (CYANOCOBALAMIN) 1000 MCG tablet Take 1,000 mcg by mouth daily.     Multiple Vitamin (MULTIVITAMIN) tablet Take 1 tablet by mouth daily.     No facility-administered medications prior to visit.       Objective:   Physical Exam:  General appearance: 55 y.o., female, NAD, conversant  Eyes: anicteric sclerae; PERRL, tracking appropriately HENT: NCAT; MMM Neck: Trachea midline; no lymphadenopathy, no JVD Lungs: CTAB, no crackles, no wheeze, with normal respiratory effort CV: RRR, no murmur  Abdomen: Soft, non-tender; non-distended, BS  present  Extremities: No peripheral edema, warm Skin: Normal turgor and texture; no rash Psych: Appropriate affect Neuro: Alert and oriented to person and place, no focal deficit     Vitals:   07/17/21 1539  BP: 116/70  Pulse: 73  SpO2: 97%  Weight: 192 lb (87.1 kg)  Height: 5' (1.524 m)    97% on RA BMI Readings from Last 3 Encounters:  07/17/21 37.50 kg/m  04/10/21 38.20 kg/m   Wt Readings from Last 3 Encounters:  07/17/21 192 lb (87.1 kg)  04/10/21 195 lb 9.6 oz (88.7 kg)     CBC    Component Value Date/Time   WBC 6.7 04/10/2021 1519   RBC 4.55 04/10/2021 1519   HGB 12.1 04/10/2021 1519   HCT 38.1 04/10/2021 1519   PLT 302.0 04/10/2021 1519   MCV 83.9 04/10/2021 1519   MCHC 31.8 04/10/2021 1519   RDW 14.4 04/10/2021 1519   LYMPHSABS 2.0 04/10/2021 1519   MONOABS 0.6 04/10/2021 1519   EOSABS 0.1 04/10/2021 1519   BASOSABS 0.1 04/10/2021 1519      Chest Imaging: CXR 01/2021 reviewed by me and unremarkable  Pulmonary Functions Testing Results: PFT Results Latest Ref Rng & Units 05/29/2021 04/24/2021  FVC-Pre L 2.17 2.19  FVC-Predicted Pre % 90 91  FVC-Post L 2.19 2.24  FVC-Predicted Post % 91 93  Pre FEV1/FVC % % 89 85  Post FEV1/FCV % % 87 83  FEV1-Pre L 1.94 1.85  FEV1-Predicted Pre % 101 97  FEV1-Post L 1.91 1.85  DLCO uncorrected ml/min/mmHg - 19.83  DLCO UNC% % - 109  DLCO corrected ml/min/mmHg - 19.83  DLCO COR %Predicted % - 109  DLVA Predicted % - 132  TLC L - 4.11  TLC % Predicted % - 92  RV % Predicted % - 111    05/29/21 methacholine challenge reviewed by me negative for airway hyperresponsiveness     Assessment & Plan:   # Chronic cough # DOE Likely multifactorial, does have active post-nasal drainage, some sinonasal congestion. GERD possible but no overt symptoms. Speech following with some concern for component of VCD. Her DOE could reflect deconditioning, CHF but not volume overloaded on exam, among other  possibilities.  Plan: - trial omeprazole 40 mg 30 min before breakfast daily - ENT referral evaluate for possible LPR/irritable larynx vs VCD - if no improvement after ppi and ENT eval unrevealing consider bronchoscopy for non-asthmatic eosinophilic bronchitis - continue flonase which has been helpful      Omar Person, MD [facility] 07/17/2021 5:49 PM

## 2021-07-17 ENCOUNTER — Other Ambulatory Visit: Payer: Self-pay

## 2021-07-17 ENCOUNTER — Encounter: Payer: Self-pay | Admitting: Student

## 2021-07-17 ENCOUNTER — Ambulatory Visit: Payer: Federal, State, Local not specified - PPO | Admitting: Student

## 2021-07-17 VITALS — BP 116/70 | HR 73 | Ht 60.0 in | Wt 192.0 lb

## 2021-07-17 DIAGNOSIS — R053 Chronic cough: Secondary | ICD-10-CM | POA: Diagnosis not present

## 2021-07-17 MED ORDER — OMEPRAZOLE 2 MG/ML ORAL SUSPENSION
40.0000 mg | Freq: Every day | ORAL | 3 refills | Status: DC
Start: 2021-07-17 — End: 2021-08-19

## 2021-07-17 NOTE — Patient Instructions (Addendum)
-   omeprazole 40 mg once daily 30 minutes before breakfast - keep up the good work with flonase - ENT referral - see you in 8 weeks

## 2021-07-31 DIAGNOSIS — M7671 Peroneal tendinitis, right leg: Secondary | ICD-10-CM | POA: Diagnosis not present

## 2021-08-19 ENCOUNTER — Other Ambulatory Visit: Payer: Self-pay | Admitting: *Deleted

## 2021-08-19 MED ORDER — OMEPRAZOLE 40 MG PO CPDR: 40.0000 mg | DELAYED_RELEASE_CAPSULE | Freq: Every day | ORAL | 3 refills | Status: DC

## 2021-08-21 DIAGNOSIS — M25552 Pain in left hip: Secondary | ICD-10-CM | POA: Diagnosis not present

## 2021-08-21 DIAGNOSIS — M9901 Segmental and somatic dysfunction of cervical region: Secondary | ICD-10-CM | POA: Diagnosis not present

## 2021-08-21 DIAGNOSIS — M9905 Segmental and somatic dysfunction of pelvic region: Secondary | ICD-10-CM | POA: Diagnosis not present

## 2021-08-21 DIAGNOSIS — M50322 Other cervical disc degeneration at C5-C6 level: Secondary | ICD-10-CM | POA: Diagnosis not present

## 2021-09-11 ENCOUNTER — Ambulatory Visit: Payer: Federal, State, Local not specified - PPO | Admitting: Student

## 2021-09-27 ENCOUNTER — Other Ambulatory Visit: Payer: Self-pay | Admitting: Student

## 2021-10-08 NOTE — Progress Notes (Signed)
? ?Synopsis: Referred for cough by Deatra James, MD ? ?Subjective:  ? ?PATIENT ID: Alexandra [name] GENDER: female DOB: 1965/11/07, MRN: [id number] ? ?Chief Complaint  ?Patient presents with  ? Follow-up  ?  Cough is overall doing better. She has noticed that eating foods that are dry. She feels mucus in her throat but she can not cough it out.   ? ?55yF with history of covid-19 infection 07/2020, cough/VCD followed by speech. She was vaccinated with pfizer and booster. Never had asthma as a kid. Never has ahd sinus surgery ? ?She was not hospitalized but was out of work for [employer] dealing with it. Never got any specific treatment for it.  ? ?She has cough that is sometimes dry, sometimes productive of clearish sputum. SHe is seeing speech but they haven't done FNL. She doesn't have accompanying throat tightness but she does have associated hoarseness. Gets occasional chest tightness which is improved with albuterol. She periodically has PND but it is getting better (2-3x per week still). She does have some sinonasal congestion.  She has no reflux/heartburn symptoms.  ? ?She does think a course of prednisone imrpoved her cough when she got it. ? ?She has no family history of lung disease ? 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 She has no exposure to dusts/solvents/particulates without mask other than occasional candle-making. She has a dog. No pet bird. No hot tub. Never has lived outside of [place]. ? ?She never smoked, vaped.  ? ?Interval HPI: ?Started on ppi last visit. She desired suspension however this was unaffordable and she found the omeprazole pills to be too large.  ? ?She is still using flonase. Her postnasal drainage is improved - only notes it occasionally.  ? ?She has been using her CPAP more and then using dental appliance some.  ? ?She has no DOE.  ? ?She did have a bad cold or flu that made her reschedule her appointment with [place].   ? ?Otherwise pertinent review of systems is negative. ? ? ? ?No past medical history on file.  ? ?Family History  ?Problem Relation Age of Onset  ? Breast cancer Mother   ? Breast cancer Maternal Aunt   ?  ? ?No past surgical history on file. ? ?Social History  ? ?Socioeconomic History  ? Marital status: Married  ?  Spouse name: Not on file  ? Number of children: Not on file  ? Years of education: Not on file  ? Highest education level: Not on file  ?Occupational History  ? Not on file  ?Tobacco Use  ? Smoking status: Never  ? Smokeless tobacco: Never  ?Substance and Sexual Activity  ? Alcohol use: Yes  ? Drug use: Never  ? Sexual activity: Not on file  ?Other Topics Concern  ? Not on file  ?Social History Narrative  ? Not on file  ? ?Social Determinants of Health  ? ?Financial Resource Strain: Not on file  ?Food Insecurity: Not on file  ?Transportation Needs: Not on file  ?Physical Activity: Not on file  ?Stress: Not on file  ?Social Connections: Not on file  ?Intimate Partner Violence: Not on file  ?  ? ?Allergies  ?Allergen Reactions  ? Penicillins Swelling  ?  Patient reports he tongue swelled.  ?  ? ?Outpatient Medications Prior to Visit  ?Medication Sig Dispense Refill  ? albuterol (VENTOLIN HFA) 108 (90 Base) MCG/ACT inhaler  SMARTSIG:1 Puff(s) By Mouth Every 4 Hours PRN    ? AMBULATORY NON FORMULARY MEDICATION Full Cranial Prosthesis: A92.82 12 applicator 0  ? fluticasone (FLONASE) 50 MCG/ACT nasal spray SHAKE LIQUID AND USE 1 SPRAY IN EACH NOSTRIL DAILY 16 g 11  ? tacrolimus (PROTOPIC) 0.1 % ointment Apply 1 application topically daily. Patient only wants brand. Does not want generic 100 g 8  ? omeprazole (PRILOSEC) 40 MG capsule Take 1 capsule (40 mg total) by mouth daily. 30 capsule 3  ? ?No facility-administered medications prior to visit.  ? ? ? ? ? ?Objective:  ? ?Physical Exam: ? ?General appearance: 56 y.o., female, NAD, conversant  ?Eyes: anicteric sclerae; PERRL, tracking appropriately ?HENT: NCAT;  MMM ?Neck: Trachea midline; no lymphadenopathy, no JVD ?Lungs: CTAB, no crackles, no wheeze, with normal respiratory effort ?CV: RRR, no murmur  ?Abdomen: Soft, non-tender; non-distended, BS present  ?Extremities: No peripheral edema, warm ?Skin: Normal turgor and texture; no rash ?Psych: Appropriate affect ?Neuro: Alert and oriented to person and place, no focal deficit  ? ? ? ?Vitals:  ? 10/09/21 0905  ?BP: 110/68  ?Pulse: 77  ?Temp: 98.3 ?F (36.8 ?C)  ?TempSrc: Oral  ?SpO2: 99%  ?Weight: 197 lb (89.4 kg)  ?Height: 5\' 1"  (1.549 m)  ? ? ? ?99% on RA ?BMI Readings from Last 3 Encounters:  ?10/09/21 37.22 kg/m?  ?07/17/21 37.50 kg/m?  ?04/10/21 38.20 kg/m?  ? ?Wt Readings from Last 3 Encounters:  ?10/09/21 197 lb (89.4 kg)  ?07/17/21 192 lb (87.1 kg)  ?04/10/21 195 lb 9.6 oz (88.7 kg)  ? ? ? ?CBC ?   ?Component Value Date/Time  ? WBC 6.7 04/10/2021 1519  ? RBC 4.55 04/10/2021 1519  ? HGB 12.1 04/10/2021 1519  ? HCT 38.1 04/10/2021 1519  ? PLT 302.0 04/10/2021 1519  ? MCV 83.9 04/10/2021 1519  ? MCHC 31.8 04/10/2021 1519  ? RDW 14.4 04/10/2021 1519  ? LYMPHSABS 2.0 04/10/2021 1519  ? MONOABS 0.6 04/10/2021 1519  ? EOSABS 0.1 04/10/2021 1519  ? BASOSABS 0.1 04/10/2021 1519  ? ? ? ? ?Chest Imaging: ?CXR 01/2021 reviewed by me and unremarkable ? ?Pulmonary Functions Testing Results: ? ?  Latest Ref Rng & Units 05/29/2021  ?  9:44 AM 04/24/2021  ? 12:08 PM  ?PFT Results  ?FVC-Pre L 2.17  P 2.19    ?FVC-Predicted Pre % 90  P 91    ?FVC-Post L 2.19  P 2.24    ?FVC-Predicted Post % 91  P 93    ?Pre FEV1/FVC % % 89  P 85    ?Post FEV1/FCV % % 87  P 83    ?FEV1-Pre L 1.94  P 1.85    ?FEV1-Predicted Pre % 101  P 97    ?FEV1-Post L 1.91  P 1.85    ?DLCO uncorrected ml/min/mmHg  19.83    ?DLCO UNC% %  109    ?DLCO corrected ml/min/mmHg  19.83    ?DLCO COR %Predicted %  109    ?DLVA Predicted %  132    ?TLC L  4.11    ?TLC % Predicted %  92    ?RV % Predicted %  111    ?  ?P Preliminary result  ? ? ?05/29/21 methacholine challenge  reviewed by me negative for airway hyperresponsiveness ? ?   ?Assessment & Plan:  ? ?# Chronic cough ?# DOE ?Likely multifactorial, does have active post-nasal drainage, some sinonasal congestion. GERD possible but no overt symptoms. Speech  following with some concern for component of VCD. Her DOE could reflect deconditioning, CHF but not volume overloaded on exam, among other possibilities. ? ?Plan: ?- trial ppi 30 min before breakfast daily - will verify lowest cost suspension or ppi with small pill size with pharmacy ?- ENT referral to evaluate for possible LPR/irritable larynx vs VCD ?- if no improvement after ppi and ENT eval unrevealing consider bronchoscopy for non-asthmatic eosinophilic bronchitis ?- continue flonase which has been helpful ? ? ? ?RTC 8 weeks ? ?Omar Person, MD ?[facility] ?10/09/2021 5:51 PM  ? ?

## 2021-10-09 ENCOUNTER — Encounter: Payer: Self-pay | Admitting: Student

## 2021-10-09 ENCOUNTER — Ambulatory Visit: Payer: Federal, State, Local not specified - PPO | Admitting: Student

## 2021-10-09 ENCOUNTER — Other Ambulatory Visit: Payer: Self-pay

## 2021-10-09 VITALS — BP 110/68 | HR 77 | Temp 98.3°F | Ht 61.0 in | Wt 197.0 lb

## 2021-10-09 DIAGNOSIS — R053 Chronic cough: Secondary | ICD-10-CM | POA: Diagnosis not present

## 2021-10-09 NOTE — Patient Instructions (Addendum)
-   We have placed referral to [facility] ENT for possibility of vocal cord dysfunction or irritable larynx/LPR ?- I will send my chart message to let you know I've prescribed GERD medicine to [facility] on [address] ?- referral placed for medical weight loss management with [facility] on [address] ?- see you in 8 weeks! ? ? ? ? ?

## 2021-10-13 ENCOUNTER — Other Ambulatory Visit (HOSPITAL_COMMUNITY): Payer: Self-pay

## 2021-10-13 DIAGNOSIS — Z0289 Encounter for other administrative examinations: Secondary | ICD-10-CM

## 2021-10-14 ENCOUNTER — Other Ambulatory Visit: Payer: Self-pay | Admitting: Family Medicine

## 2021-10-14 DIAGNOSIS — Z1231 Encounter for screening mammogram for malignant neoplasm of breast: Secondary | ICD-10-CM

## 2021-10-23 ENCOUNTER — Ambulatory Visit (INDEPENDENT_AMBULATORY_CARE_PROVIDER_SITE_OTHER): Payer: Federal, State, Local not specified - PPO | Admitting: Bariatrics

## 2021-10-23 ENCOUNTER — Encounter (INDEPENDENT_AMBULATORY_CARE_PROVIDER_SITE_OTHER): Payer: Self-pay | Admitting: Bariatrics

## 2021-10-23 VITALS — BP 108/71 | HR 78 | Temp 98.5°F | Ht 61.0 in | Wt 192.0 lb

## 2021-10-23 DIAGNOSIS — K5909 Other constipation: Secondary | ICD-10-CM

## 2021-10-23 DIAGNOSIS — G4730 Sleep apnea, unspecified: Secondary | ICD-10-CM | POA: Diagnosis not present

## 2021-10-23 DIAGNOSIS — Z1331 Encounter for screening for depression: Secondary | ICD-10-CM

## 2021-10-23 DIAGNOSIS — R7309 Other abnormal glucose: Secondary | ICD-10-CM

## 2021-10-23 DIAGNOSIS — E668 Other obesity: Secondary | ICD-10-CM

## 2021-10-23 DIAGNOSIS — R5383 Other fatigue: Secondary | ICD-10-CM | POA: Diagnosis not present

## 2021-10-23 DIAGNOSIS — Z6836 Body mass index (BMI) 36.0-36.9, adult: Secondary | ICD-10-CM

## 2021-10-23 DIAGNOSIS — E049 Nontoxic goiter, unspecified: Secondary | ICD-10-CM

## 2021-10-23 DIAGNOSIS — R0602 Shortness of breath: Secondary | ICD-10-CM

## 2021-10-23 DIAGNOSIS — E739 Lactose intolerance, unspecified: Secondary | ICD-10-CM

## 2021-10-23 DIAGNOSIS — E559 Vitamin D deficiency, unspecified: Secondary | ICD-10-CM | POA: Diagnosis not present

## 2021-10-24 LAB — HEMOGLOBIN A1C
Est. average glucose Bld gHb Est-mCnc: 126 mg/dL
Hgb A1c MFr Bld: 6 % — ABNORMAL HIGH (ref 4.8–5.6)

## 2021-10-24 LAB — TSH+T4F+T3FREE
Free T4: 1.15 ng/dL (ref 0.82–1.77)
T3, Free: 3.3 pg/mL (ref 2.0–4.4)
TSH: 1.07 u[IU]/mL (ref 0.450–4.500)

## 2021-10-24 LAB — LIPID PANEL WITH LDL/HDL RATIO
Cholesterol, Total: 179 mg/dL (ref 100–199)
HDL: 53 mg/dL (ref >39)
LDL Chol Calc (NIH): 115 mg/dL — ABNORMAL HIGH (ref 0–99)
LDL/HDL Ratio: 2.2 ratio (ref 0.0–3.2)
Triglycerides: 59 mg/dL (ref 0–149)
VLDL Cholesterol Cal: 11 mg/dL (ref 5–40)

## 2021-10-24 LAB — COMPREHENSIVE METABOLIC PANEL
ALT: 14 IU/L (ref 0–32)
AST: 17 IU/L (ref 0–40)
Albumin/Globulin Ratio: 1.5 (ref 1.2–2.2)
Albumin: 4.3 g/dL (ref 3.8–4.9)
Alkaline Phosphatase: 74 IU/L (ref 44–121)
BUN/Creatinine Ratio: 12 (ref 9–23)
BUN: 11 mg/dL (ref 6–24)
Bilirubin Total: 0.3 mg/dL (ref 0.0–1.2)
CO2: 23 mmol/L (ref 20–29)
Calcium: 9 mg/dL (ref 8.7–10.2)
Chloride: 104 mmol/L (ref 96–106)
Creatinine, Ser: 0.91 mg/dL (ref 0.57–1.00)
Globulin, Total: 2.8 g/dL (ref 1.5–4.5)
Glucose: 90 mg/dL (ref 70–99)
Potassium: 4.2 mmol/L (ref 3.5–5.2)
Sodium: 141 mmol/L (ref 134–144)
Total Protein: 7.1 g/dL (ref 6.0–8.5)
eGFR: 75 mL/min/{1.73_m2} (ref >59)

## 2021-10-24 LAB — VITAMIN D 25 HYDROXY (VIT D DEFICIENCY, FRACTURES): Vit D, 25-Hydroxy: 36.5 ng/mL (ref 30.0–100.0)

## 2021-10-24 LAB — INSULIN, RANDOM: INSULIN: 17.3 u[IU]/mL (ref 2.6–24.9)

## 2021-10-24 NOTE — Progress Notes (Signed)
? ? ? ?Chief Complaint:  ? ?OBESITY ?Alexandra [name] (MR# [id number]) is a 56 y.o. female who presents for evaluation and treatment of obesity and related comorbidities. Current BMI is Body mass index is 36.28 kg/m?Marland Kitchen Alexandra [name] has been struggling with her weight for many years and has been unsuccessful in either losing weight, maintaining weight loss, or reaching her healthy weight goal. ? ?Alexandra [name] states that cooking is okay, but notes time as an obstacle. She states that she has some cravings at night.  ? ?Alexandra [name] is currently in the action stage of change and ready to dedicate time achieving and maintaining a healthier weight. Alexandra [name] is interested in becoming our patient and working on intensive lifestyle modifications including (but not limited to) diet and exercise for weight loss. ? ?Alexandra [name]'s habits were reviewed today and are as follows: her desired weight loss is 57 pounds, she started gaining weight after son's birth. She got Depo shots, her heaviest weight ever was 198 pounds, she has significant food cravings issues, she snacks frequently in the evenings, she wakes up frequently in the middle of the night to eat, she skips meals frequently, she is frequently drinking liquids with calories, she frequently makes poor food choices, she frequently eats larger portions than normal, and she struggles with emotional eating. ? ?Depression Screen ?Alexandra [name]'s Food and Mood (modified PHQ-9) score was 19. ? ? ?  10/23/2021  ?  8:44 AM  ?Depression screen PHQ 2/9  ?Decreased Interest 2  ?Down, Depressed, Hopeless 1  ?PHQ - 2 Score 3  ?Altered sleeping 3  ?Tired, decreased energy 3  ?Change in appetite 3  ?Feeling bad or failure about yourself  1  ?Trouble concentrating 3  ?Moving slowly or fidgety/restless 3  ?Suicidal thoughts 0  ?PHQ-9 Score 19  ?Difficult doing work/chores Somewhat difficult  ? ?Subjective:  ? ?1. Other fatigue ?Sabel will continue activities. Tawonda admits to daytime somnolence and admits to waking up  still tired. Patient has a history of symptoms of daytime fatigue and morning fatigue. Alexandra [name] generally gets 4 or 5 hours of sleep per night, and states that she has difficulty falling back asleep if awakened. Snoring is present. Apneic episodes is present. Epworth Sleepiness Score is 16.   ? ?2. SOB (shortness of breath) on exertion ?Shell will continue activities. Maylani notes increasing shortness of breath with exercising and seems to be worsening over time with weight gain. She notes getting out of breath sooner with activity than she used to. This has not gotten worse recently. Alexandra [name] denies shortness of breath at rest or orthopnea.  ? ?3. Sleep apnea in adult ?Alexandra [name] wears a CPAP at night.  ? ?4. Goiter ?Alexandra [name] has seen an endocrinologist.  ? ?5. Other constipation ?Alexandra [name] notes intermittent constipation.  ? ?6. Lactose intolerance ?Alexandra [name] notes lactose to milk and ice cream, not yogurt.  ? ?7. Vitamin D deficiency ?Alexandra [name] is not on medication.  ? ?8. Elevated glucose ?We discussed elevated glucose.  ? ?Assessment/Plan:  ? ?1. Other fatigue ?Alexandra [name] will gradually increase activities and exercise. We will review EKG and CMP today. Alexandra [name] does feel that her weight is causing her energy to be lower than it should be. Fatigue may be related to obesity, depression or many other causes. Labs will be ordered, and in the meanwhile, Jarita will focus on self care including making healthy food choices, increasing physical activity and focusing on stress reduction.  ?- EKG 12-Lead ?- Comprehensive metabolic panel ? ?2. SOB (shortness of breath) on  exertion ?Alexandra [name] will gradually increase activities and exercise. Alexandra [name] does feel that she gets out of breath more easily that she used to when she exercises. Alexandra [name]'s shortness of breath appears to be obesity related and exercise induced. She has agreed to work on weight loss and gradually increase exercise to treat her exercise induced shortness of breath. Will continue to  monitor closely.  ? ?3. Sleep apnea in adult ?We will discussed the importance of CPAP and restful sleep. Intensive lifestyle modifications are the first line treatment for this issue. We discussed several lifestyle modifications today and she will continue to work on diet, exercise and weight loss efforts. We will continue to monitor. Orders and follow up as documented in patient record.   ? ?4. Goiter ?We will check thyroid panel.  ? ?- TSH+T4F+T3Free ? ?5. Other constipation ?Alexandra [name] was informed that a decrease in bowel movement frequency is normal while losing weight, but stools should not be hard or painful. Alexandra [name] will increase water intake. She will increase fruits/vegetables. She will detox. She will consider Miralax. Orders and follow up as documented in patient record.  ? ?Counseling ?Getting to Good Bowel Health: Your goal is to have one soft bowel movement each day. Drink at least 8 glasses of water each day. Eat plenty of fiber (goal is over 25 grams each day). It is best to get most of your fiber from dietary sources which includes leafy green vegetables, fresh fruit, and whole grains. You may need to add fiber with the help of OTC fiber supplements. These include Metamucil, Citrucel, and Flaxseed. If you are still having trouble, try adding Miralax or Magnesium Citrate. If all of these changes do not work, Dietitian.  ? ?6. Lactose intolerance ?Alexandra [name] will watch her triggers.  ? ?7. Vitamin D deficiency ?Low Vitamin D level contributes to fatigue and are associated with obesity, breast, and colon cancer. We will check Vitamin D today and Alexandra [name] will follow-up for routine testing of Vitamin D, at least 2-3 times per year to avoid over-replacement. ? ?- VITAMIN D 25 Hydroxy (Vit-D Deficiency, Fractures) ? ?8. Elevated glucose ?We will check A1C, insulin, CMP and lipids today.  ? ?- Comprehensive metabolic panel ?- Hemoglobin A1c ?- Insulin, random ?- Lipid Panel With LDL/HDL Ratio ? ?9.  Depression screen ?Alexandra [name] had a positive depression screening. Depression is commonly associated with obesity and often results in emotional eating behaviors. We will monitor this closely and work on CBT to help improve the non-hunger eating patterns. Referral to Psychology may be required if no improvement is seen as she continues in our clinic.  ? ?10. Class 2 severe obesity with serious comorbidity and body mass index (BMI) of 36.0 to 36.9 in adult, unspecified obesity type (HCC) ?Alexandra [name] is currently in the action stage of change and her goal is to continue with weight loss efforts. I recommend Alexandra [name] begin the structured treatment plan as follows: ? ?She has agreed to the Category 3 Plan and keeping a food journal and adhering to recommended goals of 1500 calories and 90 grams of protein. ? ?Alexandra [name] will continue meal planning and she will continue intentional eating.  ? ?Exercise goals:  Yzabelle is going to the gym with a trainer.    ? ?Behavioral modification strategies: increasing lean protein intake, decreasing simple carbohydrates, increasing vegetables, increasing water intake, decreasing eating out, no skipping meals, meal planning and cooking strategies, keeping healthy foods in the home, and planning for success. ? ?She was informed of  the importance of frequent follow-up visits to maximize her success with intensive lifestyle modifications for her multiple health conditions. She was informed we would discuss her lab results at her next visit unless there is a critical issue that needs to be addressed sooner. Fleet ContrasRachel agreed to keep her next visit at the agreed upon time to discuss these results. ? ?Objective:  ? ?Blood pressure 108/71, pulse 78, temperature 98.5 ?F (36.9 ?C), height 5\' 1"  (1.549 m), weight 192 lb (87.1 kg), SpO2 99 %. Body mass index is 36.28 kg/m?. ? ?EKG: Normal sinus rhythm, rate 57 bpm. ? ?Indirect Calorimeter completed today shows a VO2 of 280 and a REE of 1930.  Her calculated basal  metabolic rate is [id number] thus her basal metabolic rate is better than expected. ? ?General: Cooperative, alert, well developed, in no acute distress. ?HEENT: Conjunctivae and lids unremarkable. ?Cardiovascu

## 2021-10-27 ENCOUNTER — Encounter (INDEPENDENT_AMBULATORY_CARE_PROVIDER_SITE_OTHER): Payer: Self-pay | Admitting: Bariatrics

## 2021-10-27 DIAGNOSIS — R7303 Prediabetes: Secondary | ICD-10-CM | POA: Insufficient documentation

## 2021-10-28 DIAGNOSIS — E6609 Other obesity due to excess calories: Secondary | ICD-10-CM | POA: Insufficient documentation

## 2021-10-28 DIAGNOSIS — G4733 Obstructive sleep apnea (adult) (pediatric): Secondary | ICD-10-CM | POA: Insufficient documentation

## 2021-11-06 ENCOUNTER — Encounter: Payer: Self-pay | Admitting: Dermatology

## 2021-11-06 ENCOUNTER — Telehealth (INDEPENDENT_AMBULATORY_CARE_PROVIDER_SITE_OTHER): Payer: Self-pay

## 2021-11-06 ENCOUNTER — Ambulatory Visit (INDEPENDENT_AMBULATORY_CARE_PROVIDER_SITE_OTHER): Payer: Federal, State, Local not specified - PPO | Admitting: Bariatrics

## 2021-11-06 ENCOUNTER — Ambulatory Visit: Payer: Federal, State, Local not specified - PPO | Admitting: Dermatology

## 2021-11-06 ENCOUNTER — Encounter (INDEPENDENT_AMBULATORY_CARE_PROVIDER_SITE_OTHER): Payer: Self-pay | Admitting: Bariatrics

## 2021-11-06 VITALS — BP 108/71 | HR 71 | Temp 98.6°F | Ht 61.0 in | Wt 190.0 lb

## 2021-11-06 DIAGNOSIS — L299 Pruritus, unspecified: Secondary | ICD-10-CM | POA: Diagnosis not present

## 2021-11-06 DIAGNOSIS — G4733 Obstructive sleep apnea (adult) (pediatric): Secondary | ICD-10-CM | POA: Diagnosis not present

## 2021-11-06 DIAGNOSIS — R632 Polyphagia: Secondary | ICD-10-CM

## 2021-11-06 DIAGNOSIS — L669 Cicatricial alopecia, unspecified: Secondary | ICD-10-CM

## 2021-11-06 DIAGNOSIS — Z6835 Body mass index (BMI) 35.0-35.9, adult: Secondary | ICD-10-CM

## 2021-11-06 DIAGNOSIS — R7303 Prediabetes: Secondary | ICD-10-CM | POA: Diagnosis not present

## 2021-11-06 DIAGNOSIS — E669 Obesity, unspecified: Secondary | ICD-10-CM

## 2021-11-06 DIAGNOSIS — E559 Vitamin D deficiency, unspecified: Secondary | ICD-10-CM

## 2021-11-06 MED ORDER — WEGOVY 0.25 MG/0.5ML ~~LOC~~ SOAJ: 0.2500 mg | SUBCUTANEOUS | 0 refills | Status: DC

## 2021-11-06 MED ORDER — TACROLIMUS 0.1 % EX OINT: TOPICAL_OINTMENT | Freq: Every day | CUTANEOUS | 3 refills | Status: AC

## 2021-11-06 MED ORDER — VITAMIN D (ERGOCALCIFEROL) 1.25 MG (50000 UNIT) PO CAPS: 50000.0000 [IU] | ORAL_CAPSULE | ORAL | 0 refills | Status: DC

## 2021-11-06 MED ORDER — CLOBETASOL PROPIONATE 0.05 % EX FOAM: Freq: Two times a day (BID) | CUTANEOUS | 5 refills | Status: AC

## 2021-11-06 NOTE — Telephone Encounter (Signed)
Patient is calling in stating Alexandra [name] was over $1000 with insurance. Asked if there is something else that can be prescribed.  ?

## 2021-11-06 NOTE — Progress Notes (Signed)
Make sure to get this pt chart done soon, to schedule with Dr. Darreld Mclean.  ?

## 2021-11-09 ENCOUNTER — Encounter (INDEPENDENT_AMBULATORY_CARE_PROVIDER_SITE_OTHER): Payer: Self-pay | Admitting: Bariatrics

## 2021-11-10 NOTE — Telephone Encounter (Signed)
Left message on pts voicemail to return call.

## 2021-11-10 NOTE — Telephone Encounter (Signed)
Please advise 

## 2021-11-11 ENCOUNTER — Other Ambulatory Visit (INDEPENDENT_AMBULATORY_CARE_PROVIDER_SITE_OTHER): Payer: Self-pay | Admitting: Bariatrics

## 2021-11-11 MED ORDER — OZEMPIC (0.25 OR 0.5 MG/DOSE) 2 MG/1.5ML ~~LOC~~ SOPN: 0.2500 mg | PEN_INJECTOR | SUBCUTANEOUS | 0 refills | Status: DC

## 2021-11-11 NOTE — Telephone Encounter (Signed)
Please follow up

## 2021-11-12 NOTE — Telephone Encounter (Signed)
Please review

## 2021-11-18 ENCOUNTER — Encounter (INDEPENDENT_AMBULATORY_CARE_PROVIDER_SITE_OTHER): Payer: Self-pay | Admitting: Bariatrics

## 2021-11-18 NOTE — Telephone Encounter (Signed)
Patient did not return call.

## 2021-11-18 NOTE — Progress Notes (Signed)
? ? ? ?Chief Complaint:  ? ?OBESITY ?Alexandra [name] is here to discuss her progress with her obesity treatment plan along with follow-up of her obesity related diagnoses. Alexandra [name] is on the Category 3 Plan and journaling 1,500 calories, 90 grams protein and states she is following her eating plan approximately 50% of the time. Alexandra [name] states she is working with weights  60 minutes 1 times per week. ? ?Today's visit was #: 2 ?Starting weight: 192 lbs ?Starting date: 10/23/2021 ?Today's weight: 190 lbs ?Today's date: 11/06/2021 ?Total lbs lost to date: 2 lbs ?Total lbs lost since last in-office visit: 2 lbs ? ?Interim History: Alexandra [name] is down 2 lbs since her first visit. She had a death in the family last week. She has been unhappy.  ? ?Subjective:  ? ?1. Prediabetes ?Alexandra [name] has a diagnosis of prediabetes based on her elevated HgA1c of  and was informed this puts her at greater risk of developing diabetes. She continues to work on diet and exercise to decrease her risk of diabetes. She denies nausea or hypoglycemia. ?Alexandra [name]'s A1c level is 6.0 and Insulin is 17.3. ? ?2. OSA (obstructive sleep apnea) ?Alexandra [name] wears a CPAP. ? ?3. Polyphagia ?Alexandra [name] reports no contraindications to GLP 1 medication.  Alexandra [name] reports to be hungry during the day.  ? ?4. Vitamin D deficiency ?Alexandra [name]'s Vitamin D level is 36.5 ? ?Assessment/Plan:  ? ?1. Prediabetes ?Alexandra [name] will minimize carbohydrates (sweets & sugar). Alexandra [name] will continue to work on weight loss, exercise, and decreasing simple carbohydrates to help decrease the risk of diabetes. Insulin Resistance and diabetes hand out provided to Alexandra [name] ? ?2. OSA (obstructive sleep apnea) ?Discussed restful sleep and sleep hygiene. ? ?3. Polyphagia ? Intensive lifestyle modifications are the first line treatment for this issue. We discussed several lifestyle modifications today and she will continue to work on diet, exercise and weight loss efforts. Orders and follow up as documented in patient record. Will  prescribe Ozempic 0.25 mg injected into skin  ? ?Counseling ?Polyphagia is excessive hunger. ?Causes can include: low blood sugars, hypERthyroidism, PMS, lack of sleep, stress, insulin resistance, diabetes, certain medications, and diets that are deficient in protein and fiber.   ? ?4. Vitamin D deficiency ?Low Vitamin D level contributes to fatigue and are associated with obesity, breast, and colon cancer. She agrees to continue to take prescription Vitamin D @50 ,000 IU every week and will follow-up for routine testing of Vitamin D, at least 2-3 times per year to avoid over-replacement. ?- Vitamin D, Ergocalciferol, (DRISDOL) 1.25 MG (50000 UNIT) CAPS capsule; Take 1 capsule (50,000 Units total) by mouth every 7 (seven) days.  Dispense: 4 capsule; Refill: 0 ? ?5. Obesity, current BMI 35.9 ?Alexandra [name] is currently in the action stage of change. As such, her goal is to continue with weight loss efforts. She has agreed to the Category 3 Plan  and journal 1,500 calories, 90 grams protein. ? ?Exercise goals: Alexandra [name] agreed to weights and will go back the the treadmill. ? ?Behavioral modification strategies: increasing lean protein intake, decreasing simple carbohydrates, increasing vegetables, increasing water intake, decreasing eating out, no skipping meals, meal planning and cooking strategies, keeping healthy foods in the home, and planning for success. ? ?Alexandra [name] has agreed to follow-up with our clinic in 2-3 weeks. She was informed of the importance of frequent follow-up visits to maximize her success with intensive lifestyle modifications for her multiple health conditions.  ? ?Objective:  ? ?Blood pressure 108/71, pulse 71, temperature 98.6 ?F (37 ?C), height 5\' 1"  (  1.549 m), weight 190 lb (86.2 kg), SpO2 98 %. ?Body mass index is 35.9 kg/m?. ? ?General: Cooperative, alert, well developed, in no acute distress. ?HEENT: Conjunctivae and lids unremarkable. ?Cardiovascular: Regular rhythm.  ?Lungs: Normal work of  breathing. ?Neurologic: No focal deficits.  ? ?Lab Results  ?Component Value Date  ? CREATININE 0.91 10/23/2021  ? BUN 11 10/23/2021  ? NA 141 10/23/2021  ? K 4.2 10/23/2021  ? CL 104 10/23/2021  ? CO2 23 10/23/2021  ? ?Lab Results  ?Component Value Date  ? ALT 14 10/23/2021  ? AST 17 10/23/2021  ? ALKPHOS 74 10/23/2021  ? BILITOT 0.3 10/23/2021  ? ?Lab Results  ?Component Value Date  ? HGBA1C 6.0 (H) 10/23/2021  ? ?Lab Results  ?Component Value Date  ? INSULIN 17.3 10/23/2021  ? ?Lab Results  ?Component Value Date  ? TSH 1.070 10/23/2021  ? ?Lab Results  ?Component Value Date  ? CHOL 179 10/23/2021  ? HDL 53 10/23/2021  ? LDLCALC 115 (H) 10/23/2021  ? TRIG 59 10/23/2021  ? ?Lab Results  ?Component Value Date  ? VD25OH 36.5 10/23/2021  ? ?Lab Results  ?Component Value Date  ? WBC 6.7 04/10/2021  ? HGB 12.1 04/10/2021  ? HCT 38.1 04/10/2021  ? MCV 83.9 04/10/2021  ? PLT 302.0 04/10/2021  ? ?No results found for: IRON, TIBC, FERRITIN ? ?Attestation Statements:  ? ?Reviewed by clinician on day of visit: allergies, medications, problem list, medical history, surgical history, family history, social history, and previous encounter notes. ? ?I, Paulla Fore, CMA, am acting as transcriptionist for Dr. Corinna Capra, DO. ? ?I have reviewed the above documentation for accuracy and completeness, and I agree with the above. Corinna Capra, DO ? ?

## 2021-11-20 ENCOUNTER — Ambulatory Visit: Payer: Federal, State, Local not specified - PPO

## 2021-11-20 ENCOUNTER — Ambulatory Visit
Admission: RE | Admit: 2021-11-20 | Discharge: 2021-11-20 | Disposition: A | Discharge status: Home / Self Care | Payer: Federal, State, Local not specified - PPO | Source: Ambulatory Visit | Attending: Family Medicine | Admitting: Family Medicine

## 2021-11-20 DIAGNOSIS — Z1231 Encounter for screening mammogram for malignant neoplasm of breast: Secondary | ICD-10-CM

## 2021-11-20 DIAGNOSIS — G4733 Obstructive sleep apnea (adult) (pediatric): Secondary | ICD-10-CM | POA: Diagnosis not present

## 2021-11-25 ENCOUNTER — Encounter: Payer: Self-pay | Admitting: Dermatology

## 2021-11-25 NOTE — Progress Notes (Signed)
? ?  Follow-Up Visit ?  ?Subjective  ?Alexandra [name] is a 56 y.o. 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 Patient also needs a letter and prescription for her cranial prosthesis. ). ? ?Cicatricial alopecia, eyelid dermatitis ?Location:  ?Duration:  ?Quality:  ?Associated Signs/Symptoms: ?Modifying Factors:  ?Severity:  ?Timing: ?Context:  ? ?Objective  ?Well appearing patient in no apparent distress; mood and affect are within normal limits. ?Scalp ?She maintains fairly good control with topical therapy.  Hair density appears to be stable with only modest active inflammation.  We will schedule future visit with Dr. Darreld Mclean since both patient and I would like her input. ? 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  Discussed patch testing. ? ? ? ?A focused examination was performed including head and neck. Relevant physical exam findings are noted in the Assessment and Plan. ? ? ?Assessment & Plan  ? ? ?Cicatricial alopecia ?Scalp ? ?Okay continue topical clobetasol with emphasis on desirability of trying to taper from every day use.  Avoid use on face. ? ?clobetasol (OLUX) 0.05 % topical foam - Scalp ?Apply topically 2 (two) times daily. ? ?Itching (2) ?Right Supraorbital Region; Left Upper Eyelid ? ?Okay to continue tacrolimus, again ideally used episodically. ? ?tacrolimus (PROTOPIC) 0.1 % ointment - Left Upper Eyelid, Right Supraorbital Region ?Apply topically at bedtime. ? ? ? ? ? ?I, Janalyn Harder, MD, have reviewed all documentation for this visit.  The documentation on 11/25/21 for the exam, diagnosis, procedures, and orders are all accurate and complete. ?

## 2021-11-27 DIAGNOSIS — Z01419 Encounter for gynecological examination (general) (routine) without abnormal findings: Secondary | ICD-10-CM | POA: Diagnosis not present

## 2021-12-02 NOTE — Progress Notes (Signed)
Synopsis: Referred for cough by Deatra James, MD  Subjective:   PATIENT ID: Alexandra [name] GENDER: female DOB: 12/14/1965, MRN: [id number]  Chief Complaint  Patient presents with   Follow-up    Cough has improved but has not completely resolved. Cough is non prod. She notices it most during the day while she is at work.    56yF with history of covid-19 infection 07/2020, cough/VCD followed by speech. She was vaccinated with pfizer and booster. Never had asthma as a kid. Never has ahd sinus surgery  She was not hospitalized but was out of work for [employer] dealing with it. Never got any specific treatment for it.   She has cough that is sometimes dry, sometimes productive of clearish sputum. SHe is seeing speech but they haven't done FNL. She doesn't have accompanying throat tightness but she does have associated hoarseness. Gets occasional chest tightness which is improved with albuterol. She periodically has PND but it is getting better (2-3x per week still). She does have some sinonasal congestion.  She has no reflux/heartburn symptoms.   She does think a course of prednisone imrpoved her cough when she got it.  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 She has no exposure to dusts/solvents/particulates without mask other than occasional candle-making. She has a dog. No pet bird. No hot tub. Never has lived outside of [place].  She never smoked, vaped.   Interval HPI: Referred at last visit to ENT. No new imaging/PFT. Cough is much better but still hasn't gone away. Sprinkling ppi into food in the morning every day pretty much.   Some DOE when she walks upstairs.   Not much currently in way of sinus congestion/postnasal drainage.     Otherwise pertinent review of systems is negative.    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, unspecified    Seasonal  allergies    Sleep apnea    Spasm      Family History  Problem Relation Age of Onset   Breast cancer Mother    Depression Mother    High Cholesterol Mother    Obesity Mother    High blood pressure Father    Sleep apnea Father    Breast cancer Maternal Aunt      Past Surgical History:  Procedure Laterality Date   CESAREAN SECTION     CHOLECYSTECTOMY      Social History   Socioeconomic History   Marital status: Married    Spouse name: Tinnie Gens   Number of children: Not on file   Years of education: Not on file   Highest education level: Not on file  Occupational History   Occupation: Nurse  Tobacco Use   Smoking status: Never   Smokeless tobacco: Never  Substance and Sexual Activity   Alcohol use: Yes   Drug use: Never   Sexual activity: Not on file  Other Topics Concern   Not on file  Social History Narrative   Not on file   Social Determinants of Health   Financial Resource Strain: Not on file  Food Insecurity: Not on file  Transportation Needs: Not on file  Physical Activity: Not on file  Stress: Not on file  Social Connections: Not on file  Intimate Partner Violence: Not on file     Allergies  Allergen Reactions   Penicillins Swelling  Patient reports he tongue swelled.     Outpatient Medications Prior to Visit  Medication Sig Dispense Refill   albuterol (VENTOLIN HFA) 108 (90 Base) MCG/ACT inhaler SMARTSIG:1 Puff(s) By Mouth Every 4 Hours PRN     clobetasol (OLUX) 0.05 % topical foam Apply topically 2 (two) times daily. 50 g 5   fluticasone (FLONASE) 50 MCG/ACT nasal spray SHAKE LIQUID AND USE 1 SPRAY IN EACH NOSTRIL DAILY 16 g 11   omeprazole (PRILOSEC) 40 MG capsule Take 40 mg by mouth daily.     Semaglutide,0.25 or 0.5MG /DOS, (OZEMPIC, 0.25 OR 0.5 MG/DOSE,) 2 MG/1.5ML SOPN Inject 0.25 mg into the skin once a week. 3 mL 0   tacrolimus (PROTOPIC) 0.1 % ointment Apply topically at bedtime. 100 g 3   Vitamin D, Ergocalciferol, (DRISDOL) 1.25 MG  (50000 UNIT) CAPS capsule Take 1 capsule (50,000 Units total) by mouth every 7 (seven) days. 4 capsule 0   No facility-administered medications prior to visit.       Objective:   Physical Exam:  General appearance: 56 y.o., female, NAD, conversant  Eyes: anicteric sclerae; PERRL, tracking appropriately HENT: NCAT; MMM Neck: Trachea midline; no lymphadenopathy, no JVD Lungs: CTAB, no crackles, no wheeze, with normal respiratory effort CV: RRR, no murmur  Abdomen: Soft, non-tender; non-distended, BS present  Extremities: No peripheral edema, warm Skin: Normal turgor and texture; no rash Psych: Appropriate affect Neuro: Alert and oriented to person and place, no focal deficit     Vitals:   12/04/21 0901  BP: 118/70  Pulse: 66  Temp: 98.2 F (36.8 C)  TempSrc: Oral  SpO2: 97%  Weight: 186 lb (84.4 kg)  Height: 5\' 1"  (1.549 m)      97% on RA BMI Readings from Last 3 Encounters:  12/04/21 35.14 kg/m  11/06/21 35.90 kg/m  10/23/21 36.28 kg/m   Wt Readings from Last 3 Encounters:  12/04/21 186 lb (84.4 kg)  11/06/21 190 lb (86.2 kg)  10/23/21 192 lb (87.1 kg)     CBC    Component Value Date/Time   WBC 6.7 04/10/2021 1519   RBC 4.55 04/10/2021 1519   HGB 12.1 04/10/2021 1519   HCT 38.1 04/10/2021 1519   PLT 302.0 04/10/2021 1519   MCV 83.9 04/10/2021 1519   MCHC 31.8 04/10/2021 1519   RDW 14.4 04/10/2021 1519   LYMPHSABS 2.0 04/10/2021 1519   MONOABS 0.6 04/10/2021 1519   EOSABS 0.1 04/10/2021 1519   BASOSABS 0.1 04/10/2021 1519      Chest Imaging: CXR 01/2021 reviewed by me and unremarkable  Pulmonary Functions Testing Results:    Latest Ref Rng & Units 05/29/2021    9:44 AM 04/24/2021   12:08 PM  PFT Results  FVC-Pre L 2.17  P 2.19    FVC-Predicted Pre % 90  P 91    FVC-Post L 2.19  P 2.24    FVC-Predicted Post % 91  P 93    Pre FEV1/FVC % % 89  P 85    Post FEV1/FCV % % 87  P 83    FEV1-Pre L 1.94  P 1.85    FEV1-Predicted Pre % 101   P 97    FEV1-Post L 1.91  P 1.85    DLCO uncorrected ml/min/mmHg  19.83    DLCO UNC% %  109    DLCO corrected ml/min/mmHg  19.83    DLCO COR %Predicted %  109    DLVA Predicted %  132    TLC L  4.11    TLC % Predicted %  92    RV % Predicted %  111      P Preliminary result    05/29/21 methacholine challenge reviewed by me negative for airway hyperresponsiveness     Assessment & Plan:   # Chronic cough # DOE Likely multifactorial, does have active post-nasal drainage, some sinonasal congestion. GERD/LPR seem likely and she has had pretty good symptomatic response to omeprazole. Speech following with some concern for component of VCD. Her DOE could reflect deconditioning, CHF but not volume overloaded on exam, among other possibilities. 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   Plan: - I'll let you know once I hear back from pharmacy regarding least expensive ICS inhaler for you (inhaled corticosteroid). Need to rinse mouth/gargle after use - continue omeprazole 40 mg daily 30 min before breakfast sprinkled into applesauce. Can try implementing lifestyle measures to help eventually come off of omeprazole - keep ENT appointment to evaluate for possible LPR/irritable larynx vs VCD - continue flonase after clearing nose of crusting following a shower     RTC 8 weeks  Omar Person, MD  Pulmonary Critical Care 12/04/2021 9:09 AM

## 2021-12-04 ENCOUNTER — Encounter: Payer: Self-pay | Admitting: Student

## 2021-12-04 ENCOUNTER — Encounter (INDEPENDENT_AMBULATORY_CARE_PROVIDER_SITE_OTHER): Payer: Self-pay | Admitting: Nurse Practitioner

## 2021-12-04 ENCOUNTER — Telehealth: Payer: Self-pay | Admitting: Student

## 2021-12-04 ENCOUNTER — Ambulatory Visit: Payer: Federal, State, Local not specified - PPO | Admitting: Student

## 2021-12-04 ENCOUNTER — Ambulatory Visit (INDEPENDENT_AMBULATORY_CARE_PROVIDER_SITE_OTHER): Payer: Federal, State, Local not specified - PPO | Admitting: Nurse Practitioner

## 2021-12-04 VITALS — BP 118/70 | HR 66 | Temp 98.2°F | Ht 61.0 in | Wt 186.0 lb

## 2021-12-04 VITALS — BP 106/68 | HR 58 | Temp 98.1°F | Ht 61.0 in | Wt 183.0 lb

## 2021-12-04 DIAGNOSIS — E669 Obesity, unspecified: Secondary | ICD-10-CM

## 2021-12-04 DIAGNOSIS — Z6834 Body mass index (BMI) 34.0-34.9, adult: Secondary | ICD-10-CM | POA: Diagnosis not present

## 2021-12-04 DIAGNOSIS — R053 Chronic cough: Secondary | ICD-10-CM

## 2021-12-04 DIAGNOSIS — E559 Vitamin D deficiency, unspecified: Secondary | ICD-10-CM | POA: Diagnosis not present

## 2021-12-04 DIAGNOSIS — R0609 Other forms of dyspnea: Secondary | ICD-10-CM | POA: Diagnosis not present

## 2021-12-04 DIAGNOSIS — R7303 Prediabetes: Secondary | ICD-10-CM

## 2021-12-04 MED ORDER — OZEMPIC (0.25 OR 0.5 MG/DOSE) 2 MG/1.5ML ~~LOC~~ SOPN: 0.2500 mg | PEN_INJECTOR | SUBCUTANEOUS | 0 refills | Status: DC

## 2021-12-04 MED ORDER — VITAMIN D (ERGOCALCIFEROL) 1.25 MG (50000 UNIT) PO CAPS: 50000.0000 [IU] | ORAL_CAPSULE | ORAL | 0 refills | Status: DC

## 2021-12-04 MED ORDER — OMEPRAZOLE 40 MG PO CPDR
40.0000 mg | DELAYED_RELEASE_CAPSULE | Freq: Every day | ORAL | 0 refills | Status: DC
Start: 2021-12-04 — End: 2022-07-16

## 2021-12-04 NOTE — Telephone Encounter (Signed)
What is least expensive ICS inhaler for her?

## 2021-12-04 NOTE — Patient Instructions (Addendum)
- I'll let you know once I hear back from pharmacy regarding least expensive ICS inhaler for you (inhaled corticosteroid). Need to rinse mouth/gargle after use - continue omeprazole 40 mg daily 30 min before breakfast sprinkled into applesauce. Can try implementing lifestyle measures below to help eventually come off of omeprazole.  - keep ENT appointment - continue flonase after clearing nose of crusting following a shower  Gastroesophageal Reflux Disease, Adult Gastroesophageal reflux (GER) happens when acid from the stomach flows up into the tube that connects the mouth and the stomach (esophagus). Normally, food travels down the esophagus and stays in the stomach to be digested. However, when a person has GER, food and stomach acid sometimes move back up into the esophagus. If this becomes a more serious problem, the person may be diagnosed with a disease called gastroesophageal reflux disease (GERD). GERD occurs when the reflux: Happens often. Causes frequent or severe symptoms. Causes problems such as damage to the esophagus. When stomach acid comes in contact with the esophagus, the acid may cause inflammation in the esophagus. Over time, GERD may create small holes (ulcers) in the lining of the esophagus. What are the causes? This condition is caused by a problem with the muscle between the esophagus and the stomach (lower esophageal sphincter, or LES). Normally, the LES muscle closes after food passes through the esophagus to the stomach. When the LES is weakened or abnormal, it does not close properly, and that allows food and stomach acid to go back up into the esophagus. The LES can be weakened by certain dietary substances, medicines, and medical conditions, including: Tobacco use. Pregnancy. Having a hiatal hernia. Alcohol use. Certain foods and beverages, such as coffee, chocolate, onions, and peppermint. What increases the risk? You are more likely to develop this condition if  you: Have an increased body weight. Have a connective tissue disorder. Take NSAIDs, such as ibuprofen. What are the signs or symptoms? Symptoms of this condition include: Heartburn. Difficult or painful swallowing and the feeling of having a lump in the throat. A bitter taste in the mouth. Bad breath and having a large amount of saliva. Having an upset or bloated stomach and belching. Chest pain. Different conditions can cause chest pain. Make sure you see your health care provider if you experience chest pain. Shortness of breath or wheezing. Ongoing (chronic) cough or a nighttime cough. Wearing away of tooth enamel. Weight loss. How is this diagnosed? This condition may be diagnosed based on a medical history and a physical exam. To determine if you have mild or severe GERD, your health care provider may also monitor how you respond to treatment. You may also have tests, including: A test to examine your stomach and esophagus with a small camera (endoscopy). A test that measures the acidity level in your esophagus. A test that measures how much pressure is on your esophagus. A barium swallow or modified barium swallow test to show the shape, size, and functioning of your esophagus. How is this treated? Treatment for this condition may vary depending on how severe your symptoms are. Your health care provider may recommend: Changes to your diet. Medicine. Surgery. The goal of treatment is to help relieve your symptoms and to prevent complications. Follow these instructions at home: Eating and drinking  Follow a diet as recommended by your health care provider. This may involve avoiding foods and drinks such as: Coffee and tea, with or without caffeine. Drinks that contain alcohol. Energy drinks and sports drinks. Carbonated  drinks or sodas. Chocolate and cocoa. Peppermint and mint flavorings. Garlic and onions. Horseradish. Spicy and acidic foods, including peppers, chili  powder, curry powder, vinegar, hot sauces, and barbecue sauce. Citrus fruit juices and citrus fruits, such as oranges, lemons, and limes. Tomato-based foods, such as red sauce, chili, salsa, and pizza with red sauce. Fried and fatty foods, such as donuts, french fries, potato chips, and high-fat dressings. High-fat meats, such as hot dogs and fatty cuts of red and white meats, such as rib eye steak, sausage, ham, and bacon. High-fat dairy items, such as whole milk, butter, and cream cheese. Eat small, frequent meals instead of large meals. Avoid drinking large amounts of liquid with your meals. Avoid eating meals during the 2-3 hours before bedtime. Avoid lying down right after you eat. Do not exercise right after you eat. Lifestyle  Do not use any products that contain nicotine or tobacco. These products include cigarettes, chewing tobacco, and vaping devices, such as e-cigarettes. If you need help quitting, ask your health care provider. Try to reduce your stress by using methods such as yoga or meditation. If you need help reducing stress, ask your health care provider. If you are overweight, reduce your weight to an amount that is healthy for you. Ask your health care provider for guidance about a safe weight loss goal. General instructions Pay attention to any changes in your symptoms. Take over-the-counter and prescription medicines only as told by your health care provider. Do not take aspirin, ibuprofen, or other NSAIDs unless your health care provider told you to take these medicines. Wear loose-fitting clothing. Do not wear anything tight around your waist that causes pressure on your abdomen. Raise (elevate) the head of your bed about 6 inches (15 cm). You can use a wedge to do this. Avoid bending over if this makes your symptoms worse. Keep all follow-up visits. This is important. Contact a health care provider if: You have: New symptoms. Unexplained weight loss. Difficulty  swallowing or it hurts to swallow. Wheezing or a persistent cough. A hoarse voice. Your symptoms do not improve with treatment. Get help right away if: You have sudden pain in your arms, neck, jaw, teeth, or back. You suddenly feel sweaty, dizzy, or light-headed. You have chest pain or shortness of breath. You vomit and the vomit is green, yellow, or black, or it looks like blood or coffee grounds. You faint. You have stool that is red, bloody, or black. You cannot swallow, drink, or eat. These symptoms may represent a serious problem that is an emergency. Do not wait to see if the symptoms will go away. Get medical help right away. Call your local emergency services (911 in the [place] Do not drive yourself to the hospital. Summary Gastroesophageal reflux happens when acid from the stomach flows up into the esophagus. GERD is a disease in which the reflux happens often, causes frequent or severe symptoms, or causes problems such as damage to the esophagus. Treatment for this condition may vary depending on how severe your symptoms are. Your health care provider may recommend diet and lifestyle changes, medicine, or surgery. Contact a health care provider if you have new or worsening symptoms. Take over-the-counter and prescription medicines only as told by your health care provider. Do not take aspirin, ibuprofen, or other NSAIDs unless your health care provider told you to do so. Keep all follow-up visits as told by your health care provider. This is important. This information is not intended to replace advice  given to you by your health care provider. Make sure you discuss any questions you have with your health care provider. Document Revised: 01/15/2020 Document Reviewed: 01/15/2020 Elsevier Patient Education  2023 [employer].

## 2021-12-05 ENCOUNTER — Other Ambulatory Visit (HOSPITAL_COMMUNITY): Payer: Self-pay

## 2021-12-05 MED ORDER — AEROCHAMBER MV MISC: 0 refills | Status: DC

## 2021-12-05 MED ORDER — FLUTICASONE PROPIONATE HFA 110 MCG/ACT IN AERO: 1.0000 | INHALATION_SPRAY | Freq: Two times a day (BID) | RESPIRATORY_TRACT | 12 refills | Status: DC

## 2021-12-05 NOTE — Telephone Encounter (Signed)
Test billing for ICS inhalers are as follows: Qvar: $35 Pulmicort Flexihaler: $35 FLovent HFA: $35 Flovent Diskus: $35 Arnuity: $35

## 2021-12-06 ENCOUNTER — Other Ambulatory Visit (INDEPENDENT_AMBULATORY_CARE_PROVIDER_SITE_OTHER): Payer: Self-pay | Admitting: Bariatrics

## 2021-12-06 DIAGNOSIS — E559 Vitamin D deficiency, unspecified: Secondary | ICD-10-CM

## 2021-12-09 NOTE — Progress Notes (Unsigned)
Chief Complaint:   OBESITY Alexandra [name] is here to discuss her progress with her obesity treatment plan along with follow-up of her obesity related diagnoses. Alexandra [name] is on the Category 3 Plan and keeping a food journal and adhering to recommended goals of 1500 calories and 90 grams of protein and states she is following her eating plan approximately 75% of the time. Alexandra [name] states she is using the treadmill and weights for 30 minutes 4-5 times per week.  Today's visit was #: 3 Starting weight: 192 lbs Starting date: 10/23/2021 Today's weight: 183 lbs Today's date: 12/04/2021 Total lbs lost to date: 9 lbs Total lbs lost since last in-office visit: 7 lbs  Interim History: Alexandra [name] has done well with weight loss since her last visit. She has been following PC/. She is drinking a protein shake in the morning. Snack: yogurt with bacon or sausage. Lunch: protein, vegetables with rice cake. Snack: peanut butter and crackers. Dinner: protein and vegetables. She notes some hunger.   Subjective:   1. Prediabetes Alexandra [name] is currently taking Ozempic 0.25 mg. She notes occasional nausea. She has gotten better this week. Her last A1C was 6.0. She has a family history of PGM with diabetes mellitus type 2.   2. Vitamin D deficiency Alexandra [name] is currently taking Vitamin D 50,000 IU weekly. She denies nausea, vomiting, and muscle weakness.   Assessment/Plan:   1. Prediabetes Alexandra [name] will continue to work on weight loss, exercise, and decreasing simple carbohydrates to help decrease the risk of diabetes. Alexandra [name] will continue Ozempic 0.25 mg. We discussed side effects.   - Semaglutide,0.25 or 0.5MG /DOS, (OZEMPIC, 0.25 OR 0.5 MG/DOSE,) 2 MG/1.5ML SOPN; Inject 0.25 mg into the skin once a week.  Dispense: 3 mL; Refill: 0  2. Vitamin D deficiency Low Vitamin D level contributes to fatigue and are associated with obesity, breast, and colon cancer. We will refill prescription Vitamin D 50,000 IU every week for 1  month with no refills and Alexandra [name] will follow-up for routine testing of Vitamin D, at least 2-3 times per year to avoid over-replacement. We discussed side effects.   - Vitamin D, Ergocalciferol, (DRISDOL) 1.25 MG (50000 UNIT) CAPS capsule; Take 1 capsule (50,000 Units total) by mouth every 7 (seven) days.  Dispense: 4 capsule; Refill: 0  3. Obesity, current BMI 34.7 Alexandra [name] is currently in the action stage of change. As such, her goal is to continue with weight loss efforts. She has agreed to the Category 3 Plan and keeping a food journal and adhering to recommended goals of 1500 calories and 90 grams of  protein.   Exercise goals:  As is.   Behavioral modification strategies: increasing lean protein intake, increasing water intake, and no skipping meals.  Alexandra [name] has agreed to follow-up with our clinic in 3 weeks. She was informed of the importance of frequent follow-up visits to maximize her success with intensive lifestyle modifications for her multiple health conditions.   Objective:   Blood pressure 106/68, pulse (!) 58, temperature 98.1 F (36.7 C), height 5\' 1"  (1.549 m), weight 183 lb (83 kg), SpO2 98 %. Body mass index is 34.58 kg/m.  General: Cooperative, alert, well developed, in no acute distress. HEENT: Conjunctivae and lids unremarkable. Cardiovascular: Regular rhythm.  Lungs: Normal work of breathing. Neurologic: No focal deficits.   Lab Results  Component Value Date   CREATININE 0.91 10/23/2021   BUN 11 10/23/2021   NA 141 10/23/2021   K 4.2 10/23/2021   CL 104 10/23/2021  CO2 23 10/23/2021   Lab Results  Component Value Date   ALT 14 10/23/2021   AST 17 10/23/2021   ALKPHOS 74 10/23/2021   BILITOT 0.3 10/23/2021   Lab Results  Component Value Date   HGBA1C 6.0 (H) 10/23/2021   Lab Results  Component Value Date   INSULIN 17.3 10/23/2021   Lab Results  Component Value Date   TSH 1.070 10/23/2021   Lab Results  Component Value Date   CHOL 179  10/23/2021   HDL 53 10/23/2021   LDLCALC 115 (H) 10/23/2021   TRIG 59 10/23/2021   Lab Results  Component Value Date   VD25OH 36.5 10/23/2021   Lab Results  Component Value Date   WBC 6.7 04/10/2021   HGB 12.1 04/10/2021   HCT 38.1 04/10/2021   MCV 83.9 04/10/2021   PLT 302.0 04/10/2021   No results found for: IRON, TIBC, FERRITIN  Attestation Statements:   Reviewed by clinician on day of visit: allergies, medications, problem list, medical history, surgical history, family history, social history, and previous encounter notes.  I, Jackson Latino, RMA, am acting as Energy manager for Irene Limbo, FNP.  I have reviewed the above documentation for accuracy and completeness, and I agree with the above. -  ***

## 2021-12-18 DIAGNOSIS — G4733 Obstructive sleep apnea (adult) (pediatric): Secondary | ICD-10-CM | POA: Diagnosis not present

## 2021-12-25 ENCOUNTER — Encounter (INDEPENDENT_AMBULATORY_CARE_PROVIDER_SITE_OTHER): Payer: Self-pay | Admitting: Bariatrics

## 2021-12-25 ENCOUNTER — Ambulatory Visit (INDEPENDENT_AMBULATORY_CARE_PROVIDER_SITE_OTHER): Payer: Federal, State, Local not specified - PPO | Admitting: Bariatrics

## 2021-12-25 ENCOUNTER — Telehealth (INDEPENDENT_AMBULATORY_CARE_PROVIDER_SITE_OTHER): Payer: Self-pay | Admitting: Bariatrics

## 2021-12-25 VITALS — BP 94/60 | HR 73 | Temp 97.9°F | Ht 61.0 in | Wt 181.0 lb

## 2021-12-25 DIAGNOSIS — R7303 Prediabetes: Secondary | ICD-10-CM

## 2021-12-25 DIAGNOSIS — Z7985 Long-term (current) use of injectable non-insulin antidiabetic drugs: Secondary | ICD-10-CM

## 2021-12-25 DIAGNOSIS — E559 Vitamin D deficiency, unspecified: Secondary | ICD-10-CM | POA: Diagnosis not present

## 2021-12-25 DIAGNOSIS — Z6836 Body mass index (BMI) 36.0-36.9, adult: Secondary | ICD-10-CM | POA: Insufficient documentation

## 2021-12-25 DIAGNOSIS — E669 Obesity, unspecified: Secondary | ICD-10-CM | POA: Diagnosis not present

## 2021-12-25 DIAGNOSIS — K219 Gastro-esophageal reflux disease without esophagitis: Secondary | ICD-10-CM

## 2021-12-25 DIAGNOSIS — Z6834 Body mass index (BMI) 34.0-34.9, adult: Secondary | ICD-10-CM

## 2021-12-25 MED ORDER — VITAMIN D (ERGOCALCIFEROL) 1.25 MG (50000 UNIT) PO CAPS: 50000.0000 [IU] | ORAL_CAPSULE | ORAL | 0 refills | Status: DC

## 2021-12-25 NOTE — Telephone Encounter (Signed)
Pt needing refill on medication since sch'ling conflict did not allow her to have a sooner appt with Dr. Manson Passey than 01/29/22. Pt states she would like to go ahead and place a refill request at today's appt to cover where she will not be in office for a few weeks. The best pharmacy is [id number] - [place],  - [address] AT [facility] [address] [facility] [address] and the best call back number is [phone].   Last appt 6/8 with Manson Passey

## 2021-12-25 NOTE — Telephone Encounter (Signed)
Dr.Brown 

## 2021-12-25 NOTE — Telephone Encounter (Signed)
Left message for patient to return call.

## 2021-12-29 NOTE — Progress Notes (Signed)
Chief Complaint:   OBESITY Alexandra [name] is here to discuss her progress with her obesity treatment plan along with follow-up of her obesity related diagnoses. Alexandra [name] is on the Category 3 Plan and keeping a food journal and adhering to recommended goals of 1500 calories and 90 grams of protein and states she is following her eating plan approximately 75% of the time. Alexandra [name] states she is doing 0 minutes 0 times per week.  Today's visit was #: 4 Starting weight: 192 lbs Starting date: 10/23/2021 Today's weight: 181 lbs Today's date: 12/25/2021 Total lbs lost to date: 11 lbs Total lbs lost since last in-office visit: 2 lbs  Interim History: Alexandra [name] is down 2 lbs since her last visit. She has not been able to get her water in.   Subjective:   1. Prediabetes Alexandra [name] is taking Ozempic currently. She notes no problems or side effects.   2. Gastroesophageal reflux disease, unspecified whether esophagitis present Alexandra [name] is taking Prilosec. Her GERD is controlled.   3. Vitamin D deficiency Alexandra [name] is taking Vitamin D as directed.   Assessment/Plan:   1. Prediabetes Alexandra [name] will continue taking Ozempic. She will continue to work on weight loss, exercise, and decreasing simple carbohydrates to help decrease the risk of diabetes.   2. Gastroesophageal reflux disease, unspecified whether esophagitis present Intensive lifestyle modifications are the first line treatment for this issue. Alexandra [name] will continue Prilosec. She will watch her water intake. We discussed several lifestyle modifications today and she will continue to work on diet, exercise and weight loss efforts. Orders and follow up as documented in patient record.   Counseling If a person has gastroesophageal reflux disease (GERD), food and stomach acid move back up into the esophagus and cause symptoms or problems such as damage to the esophagus. Anti-reflux measures include: raising the head of the bed, avoiding tight clothing or belts,  avoiding eating late at night, not lying down shortly after mealtime, and achieving weight loss. Avoid ASA, NSAID's, caffeine, alcohol, and tobacco.  OTC Pepcid and/or Tums are often very helpful for as needed use.  However, for persisting chronic or daily symptoms, stronger medications like Omeprazole may be needed. You may need to avoid foods and drinks such as: Coffee and tea (with or without caffeine). Drinks that contain alcohol. Energy drinks and sports drinks. Bubbly (carbonated) drinks or sodas. Chocolate and cocoa. Peppermint and mint flavorings. Garlic and onions. Horseradish. Spicy and acidic foods. These include peppers, chili powder, curry powder, vinegar, hot sauces, and BBQ sauce. Citrus fruit juices and citrus fruits, such as oranges, lemons, and limes. Tomato-based foods. These include red sauce, chili, salsa, and pizza with red sauce. Fried and fatty foods. These include donuts, french fries, potato chips, and high-fat dressings. High-fat meats. These include hot dogs, rib eye steak, sausage, ham, and bacon.   3. Vitamin D deficiency Low Vitamin D level contributes to fatigue and are associated with obesity, breast, and colon cancer. We will refill prescription Vitamin D 50,000 IU every week for 1 month with no refills and will Alexandra [name] follow-up for routine testing of Vitamin D, at least 2-3 times per year to avoid over-replacement.  - Vitamin D, Ergocalciferol, (DRISDOL) 1.25 MG (50000 UNIT) CAPS capsule; Take 1 capsule (50,000 Units total) by mouth every 7 (seven) days.  Dispense: 4 capsule; Refill: 0  4. Obesity, current BMI 34.3 Alexandra [name] is currently in the action stage of change. As such, her goal is to continue with weight loss efforts. She has agreed  to the Category 3 Plan and keeping a food journal and adhering to recommended goals of 1500 calories and 90 grams of protein.   Alexandra [name] will adhere closely to the plan 80-90%. She will continue intentional eating. She will  try Vitamin water.   Exercise goals:  Alexandra [name] is not working out.   Behavioral modification strategies: increasing lean protein intake, decreasing simple carbohydrates, increasing vegetables, increasing water intake, decreasing eating out, no skipping meals, meal planning and cooking strategies, keeping healthy foods in the home, and planning for success.  Alexandra [name] has agreed to follow-up with our clinic in 2-3 weeks. She was informed of the importance of frequent follow-up visits to maximize her success with intensive lifestyle modifications for her multiple health conditions.   Objective:   Blood pressure 94/60, pulse 73, temperature 97.9 F (36.6 C), height 5\' 1"  (1.549 m), weight 181 lb (82.1 kg), SpO2 96 %. Body mass index is 34.2 kg/m.  General: Cooperative, alert, well developed, in no acute distress. HEENT: Conjunctivae and lids unremarkable. Cardiovascular: Regular rhythm.  Lungs: Normal work of breathing. Neurologic: No focal deficits.   Lab Results  Component Value Date   CREATININE 0.91 10/23/2021   BUN 11 10/23/2021   NA 141 10/23/2021   K 4.2 10/23/2021   CL 104 10/23/2021   CO2 23 10/23/2021   Lab Results  Component Value Date   ALT 14 10/23/2021   AST 17 10/23/2021   ALKPHOS 74 10/23/2021   BILITOT 0.3 10/23/2021   Lab Results  Component Value Date   HGBA1C 6.0 (H) 10/23/2021   Lab Results  Component Value Date   INSULIN 17.3 10/23/2021   Lab Results  Component Value Date   TSH 1.070 10/23/2021   Lab Results  Component Value Date   CHOL 179 10/23/2021   HDL 53 10/23/2021   LDLCALC 115 (H) 10/23/2021   TRIG 59 10/23/2021   Lab Results  Component Value Date   VD25OH 36.5 10/23/2021   Lab Results  Component Value Date   WBC 6.7 04/10/2021   HGB 12.1 04/10/2021   HCT 38.1 04/10/2021   MCV 83.9 04/10/2021   PLT 302.0 04/10/2021   No results found for: "IRON", "TIBC", "FERRITIN"  Attestation Statements:   Reviewed by clinician on day of  visit: allergies, medications, problem list, medical history, surgical history, family history, social history, and previous encounter notes.  I, 04/12/2021, RMA, am acting as Jackson Latino for Energy manager, DO.  I have reviewed the above documentation for accuracy and completeness, and I agree with the above. [employer], DO

## 2022-01-01 DIAGNOSIS — R053 Chronic cough: Secondary | ICD-10-CM | POA: Diagnosis not present

## 2022-01-01 DIAGNOSIS — K219 Gastro-esophageal reflux disease without esophagitis: Secondary | ICD-10-CM | POA: Insufficient documentation

## 2022-01-01 DIAGNOSIS — R0989 Other specified symptoms and signs involving the circulatory and respiratory systems: Secondary | ICD-10-CM | POA: Diagnosis not present

## 2022-01-01 DIAGNOSIS — J309 Allergic rhinitis, unspecified: Secondary | ICD-10-CM | POA: Diagnosis not present

## 2022-01-08 DIAGNOSIS — Z77122 Contact with and (suspected) exposure to noise: Secondary | ICD-10-CM | POA: Diagnosis not present

## 2022-01-08 DIAGNOSIS — H93293 Other abnormal auditory perceptions, bilateral: Secondary | ICD-10-CM | POA: Diagnosis not present

## 2022-01-23 ENCOUNTER — Other Ambulatory Visit (INDEPENDENT_AMBULATORY_CARE_PROVIDER_SITE_OTHER): Payer: Self-pay | Admitting: Bariatrics

## 2022-01-23 DIAGNOSIS — E559 Vitamin D deficiency, unspecified: Secondary | ICD-10-CM

## 2022-01-29 ENCOUNTER — Ambulatory Visit (INDEPENDENT_AMBULATORY_CARE_PROVIDER_SITE_OTHER): Payer: Federal, State, Local not specified - PPO | Admitting: Bariatrics

## 2022-01-29 ENCOUNTER — Encounter (INDEPENDENT_AMBULATORY_CARE_PROVIDER_SITE_OTHER): Payer: Self-pay | Admitting: Bariatrics

## 2022-01-29 VITALS — BP 103/67 | HR 85 | Temp 97.5°F | Ht 61.0 in | Wt 175.0 lb

## 2022-01-29 DIAGNOSIS — E669 Obesity, unspecified: Secondary | ICD-10-CM

## 2022-01-29 DIAGNOSIS — E559 Vitamin D deficiency, unspecified: Secondary | ICD-10-CM

## 2022-01-29 DIAGNOSIS — R7303 Prediabetes: Secondary | ICD-10-CM | POA: Diagnosis not present

## 2022-01-29 DIAGNOSIS — Z6833 Body mass index (BMI) 33.0-33.9, adult: Secondary | ICD-10-CM | POA: Diagnosis not present

## 2022-01-29 MED ORDER — OZEMPIC (0.25 OR 0.5 MG/DOSE) 2 MG/1.5ML ~~LOC~~ SOPN: 0.2500 mg | PEN_INJECTOR | SUBCUTANEOUS | 0 refills | Status: DC

## 2022-01-29 MED ORDER — VITAMIN D (ERGOCALCIFEROL) 1.25 MG (50000 UNIT) PO CAPS: 50000.0000 [IU] | ORAL_CAPSULE | ORAL | 0 refills | Status: DC

## 2022-02-03 ENCOUNTER — Encounter (INDEPENDENT_AMBULATORY_CARE_PROVIDER_SITE_OTHER): Payer: Self-pay | Admitting: Bariatrics

## 2022-02-03 NOTE — Progress Notes (Signed)
Chief Complaint:   OBESITY Alexandra [name] is here to discuss her progress with her obesity treatment plan along with follow-up of her obesity related diagnoses. Alexandra [name] is on the Category 3 Plan or keeping a food journal and adhering to recommended goals of 1500 calories and 90 grams of protein daily and states she is following her eating plan approximately 0% of the time. Alexandra [name] states she is doing 0 minutes 0 times per week.  Today's visit was #: 5 Starting weight: 192 lbs Starting date: 10/23/2021 Today's weight: 175 lbs Today's date: 01/29/2022 Total lbs lost to date: 17 Total lbs lost since last in-office visit: 6  Interim History: Alexandra [name] is down another 6 pounds since her last visit.  Her mother has been in the hospital and she has been in survival mode.  Subjective:   1. Vitamin D deficiency Alexandra [name] is taking prescription vitamin D as directed.  2. Pre-diabetes Alexandra [name] is on Ozempic 0.25 mg once weekly, and she denies side effects.  She notes her mother is in the hospital.  Assessment/Plan:   1. Vitamin D deficiency Alexandra [name] will continue prescription vitamin D 50,000 units once weekly, and we will refill for 1 month.  - Vitamin D, Ergocalciferol, (DRISDOL) 1.25 MG (50000 UNIT) CAPS capsule; Take 1 capsule (50,000 Units total) by mouth every 7 (seven) days.  Dispense: 4 capsule; Refill: 0  2. Pre-diabetes Alexandra [name] will continue Ozempic at 0.25 mg once weekly, and we will refill for 1 month.  - Semaglutide,0.25 or 0.5MG /DOS, (OZEMPIC, 0.25 OR 0.5 MG/DOSE,) 2 MG/1.5ML SOPN; Inject 0.25 mg into the skin once a week.  Dispense: 3 mL; Refill: 0  3. Obesity, current BMI 33.1 Alexandra [name] is currently in the action stage of change. As such, her goal is to continue with weight loss efforts. She has agreed to the Category 3 Plan.   Meal planning and intentional eating were discussed.  Eating out handout was given.  Exercise goals: No exercise has been prescribed at this time.  Behavioral  modification strategies: increasing lean protein intake, decreasing simple carbohydrates, increasing vegetables, increasing water intake, decreasing eating out, no skipping meals, meal planning and cooking strategies, keeping healthy foods in the home, and planning for success.  Alexandra [name] has agreed to follow-up with our clinic in 2 to 3 weeks. She was informed of the importance of frequent follow-up visits to maximize her success with intensive lifestyle modifications for her multiple health conditions.   Objective:   Blood pressure 103/67, pulse 85, temperature (!) 97.5 F (36.4 C), height 5\' 1"  (1.549 m), weight 175 lb (79.4 kg), SpO2 100 %. Body mass index is 33.07 kg/m.  General: Cooperative, alert, well developed, in no acute distress. HEENT: Conjunctivae and lids unremarkable. Cardiovascular: Regular rhythm.  Lungs: Normal work of breathing. Neurologic: No focal deficits.   Lab Results  Component Value Date   CREATININE 0.91 10/23/2021   BUN 11 10/23/2021   NA 141 10/23/2021   K 4.2 10/23/2021   CL 104 10/23/2021   CO2 23 10/23/2021   Lab Results  Component Value Date   ALT 14 10/23/2021   AST 17 10/23/2021   ALKPHOS 74 10/23/2021   BILITOT 0.3 10/23/2021   Lab Results  Component Value Date   HGBA1C 6.0 (H) 10/23/2021   Lab Results  Component Value Date   INSULIN 17.3 10/23/2021   Lab Results  Component Value Date   TSH 1.070 10/23/2021   Lab Results  Component Value Date   CHOL 179 10/23/2021  HDL 53 10/23/2021   LDLCALC 115 (H) 10/23/2021   TRIG 59 10/23/2021   Lab Results  Component Value Date   VD25OH 36.5 10/23/2021   Lab Results  Component Value Date   WBC 6.7 04/10/2021   HGB 12.1 04/10/2021   HCT 38.1 04/10/2021   MCV 83.9 04/10/2021   PLT 302.0 04/10/2021   No results found for: "IRON", "TIBC", "FERRITIN"  Attestation Statements:   Reviewed by clinician on day of visit: allergies, medications, problem list, medical history, surgical  history, family history, social history, and previous encounter notes.   Trude Mcburney, am acting as Energy manager for [employer], DO.  I have reviewed the above documentation for accuracy and completeness, and I agree with the above. Corinna Capra, DO

## 2022-02-04 NOTE — Progress Notes (Signed)
Synopsis: Referred for cough by Deatra James, MD  Subjective:   PATIENT ID: Alexandra [name] GENDER: female DOB: 17-Oct-1965, MRN: [id number]  Chief Complaint  Patient presents with   Follow-up    Only has occ cough now. She has increased omeprazole to 40 mg bid and this has helped. She has not had to use her albuterol.    55yF with history of covid-19 infection 07/2020, cough/VCD followed by speech. She was vaccinated with pfizer and booster. Never had asthma as a kid. Never has ahd sinus surgery  She was not hospitalized but was out of work for [employer] dealing with it. Never got any specific treatment for it.   She has cough that is sometimes dry, sometimes productive of clearish sputum. SHe is seeing speech but they haven't done FNL. She doesn't have accompanying throat tightness but she does have associated hoarseness. Gets occasional chest tightness which is improved with albuterol. She periodically has PND but it is getting better (2-3x per week still). She does have some sinonasal congestion.  She has no reflux/heartburn symptoms.   She does think a course of prednisone imrpoved her cough when she got it.  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 She has no exposure to dusts/solvents/particulates without mask other than occasional candle-making. She has a dog. No pet bird. No hot tub. Never has lived outside of [place].  She never smoked, vaped.   Interval HPI: Seen by ENT - FNL with arytenoid edemaerythema, suspected LPR and chronic throat clearing. Increased to BID PPI, revisited TLC measures for gerd, throat clearing. She's working on these measures.   Her mom has developed renal failure since has visit, has been hospitalized and her course has been complicated by need for craniectomy, now in stepdown unit.   Despite this stressor, after making changes above she thinks she's doing  better.   Given prescription for flovent 110 1 puff BID for possible NAEB. She has started this. She takes her ppi right after this and drinks water to clear her throat.   Otherwise pertinent review of systems is negative.    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, unspecified    Seasonal allergies    Sleep apnea    Spasm      Family History  Problem Relation Age of Onset   Breast cancer Mother    Depression Mother    High Cholesterol Mother    Obesity Mother    High blood pressure Father    Sleep apnea Father    Breast cancer Maternal Aunt      Past Surgical History:  Procedure Laterality Date   CESAREAN SECTION     CHOLECYSTECTOMY      Social History   Socioeconomic History   Marital status: Married    Spouse name: Tinnie Gens   Number of children: Not on file   Years of education: Not on file   Highest education level: Not on file  Occupational History   Occupation: Nurse  Tobacco Use   Smoking status: Never   Smokeless tobacco: Never  Substance and Sexual Activity   Alcohol use: Yes   Drug use: Never   Sexual activity: Not on file  Other Topics Concern   Not on file  Social History Narrative   Not on file   Social Determinants of Health  Financial Resource Strain: Not on file  Food Insecurity: Not on file  Transportation Needs: Not on file  Physical Activity: Not on file  Stress: Not on file  Social Connections: Not on file  Intimate Partner Violence: Not on file     Allergies  Allergen Reactions   Penicillins Swelling    Patient reports he tongue swelled.     Outpatient Medications Prior to Visit  Medication Sig Dispense Refill   albuterol (VENTOLIN HFA) 108 (90 Base) MCG/ACT inhaler SMARTSIG:1 Puff(s) By Mouth Every 4 Hours PRN     clobetasol (OLUX) 0.05 % topical foam Apply topically 2 (two) times daily. 50 g 5   fluticasone (FLONASE) 50 MCG/ACT nasal spray SHAKE LIQUID AND USE 1 SPRAY IN EACH NOSTRIL DAILY 16 g  11   fluticasone (FLOVENT HFA) 110 MCG/ACT inhaler Inhale 1 puff into the lungs 2 (two) times daily. 1 each 12   omeprazole (PRILOSEC) 40 MG capsule Take 1 capsule (40 mg total) by mouth daily. (Patient taking differently: Take 40 mg by mouth in the morning and at bedtime.) 90 capsule 0   Semaglutide,0.25 or 0.5MG /DOS, (OZEMPIC, 0.25 OR 0.5 MG/DOSE,) 2 MG/1.5ML SOPN Inject 0.25 mg into the skin once a week. 3 mL 0   Spacer/Aero-Holding Chambers (AEROCHAMBER MV) inhaler Use as instructed 1 each 0   tacrolimus (PROTOPIC) 0.1 % ointment Apply topically at bedtime. 100 g 3   Vitamin D, Ergocalciferol, (DRISDOL) 1.25 MG (50000 UNIT) CAPS capsule Take 1 capsule (50,000 Units total) by mouth every 7 (seven) days. 4 capsule 0   No facility-administered medications prior to visit.       Objective:   Physical Exam:  General appearance: 56 y.o., female, NAD, conversant  Eyes: anicteric sclerae; PERRL, tracking appropriately HENT: NCAT; MMM Neck: Trachea midline; no lymphadenopathy, no JVD Lungs: CTAB, no crackles, no wheeze, with normal respiratory effort CV: RRR, no murmur  Abdomen: Soft, non-tender; non-distended, BS present  Extremities: No peripheral edema, warm Skin: Normal turgor and texture; no rash Psych: Appropriate affect Neuro: Alert and oriented to person and place, no focal deficit     Vitals:   02/05/22 0942  BP: 122/70  Pulse: 74  SpO2: 97%  Weight: 179 lb (81.2 kg)  Height: 5\' 1"  (1.549 m)       97% on RA BMI Readings from Last 3 Encounters:  02/05/22 33.82 kg/m  01/29/22 33.07 kg/m  12/25/21 34.20 kg/m   Wt Readings from Last 3 Encounters:  02/05/22 179 lb (81.2 kg)  01/29/22 175 lb (79.4 kg)  12/25/21 181 lb (82.1 kg)     CBC    Component Value Date/Time   WBC 6.7 04/10/2021 1519   RBC 4.55 04/10/2021 1519   HGB 12.1 04/10/2021 1519   HCT 38.1 04/10/2021 1519   PLT 302.0 04/10/2021 1519   MCV 83.9 04/10/2021 1519   MCHC 31.8 04/10/2021 1519    RDW 14.4 04/10/2021 1519   LYMPHSABS 2.0 04/10/2021 1519   MONOABS 0.6 04/10/2021 1519   EOSABS 0.1 04/10/2021 1519   BASOSABS 0.1 04/10/2021 1519      Chest Imaging: CXR 01/2021 reviewed by me and unremarkable  Pulmonary Functions Testing Results:    Latest Ref Rng & Units 05/29/2021    9:44 AM 04/24/2021   12:08 PM  PFT Results  FVC-Pre L 2.17  P 2.19   FVC-Predicted Pre % 90  P 91   FVC-Post L 2.19  P 2.24   FVC-Predicted Post % 91  P  93   Pre FEV1/FVC % % 89  P 85   Post FEV1/FCV % % 87  P 83   FEV1-Pre L 1.94  P 1.85   FEV1-Predicted Pre % 101  P 97   FEV1-Post L 1.91  P 1.85   DLCO uncorrected ml/min/mmHg  19.83   DLCO UNC% %  109   DLCO corrected ml/min/mmHg  19.83   DLCO COR %Predicted %  109   DLVA Predicted %  132   TLC L  4.11   TLC % Predicted %  92   RV % Predicted %  111     P Preliminary result    05/29/21 methacholine challenge reviewed by me negative for airway hyperresponsiveness     Assessment & Plan:   # Chronic cough # DOE GERD/LPR seem like dominant issue. Speech had seen with some concern for component of VCD but not seen on FNL by ENT (albeit not during episode of dyspnea/throat tightness). Less likely but possible is a component of NAEB - she is unsure how much of her improvement in cough is due to ramped up gerd mgmt vs flovent. Her DOE could reflect deconditioning, CHF but not volume overloaded on exam, among other possibilities.   Plan: - continue flovent 1 puff bid with spacer for now - we'll see at next visit if we can come off of it - continue omeprazole 40 mg daily 30 min BID before breakfast and dinner - agree with ENT recommendations for lifestyle measures for GERD, throat clearing - continue flonase after clearing nose of crusting following a shower     RTC 8 weeks  Omar Person, MD [facility] 02/05/2022 10:01 AM

## 2022-02-05 ENCOUNTER — Encounter: Payer: Self-pay | Admitting: Student

## 2022-02-05 ENCOUNTER — Ambulatory Visit: Payer: Federal, State, Local not specified - PPO | Admitting: Student

## 2022-02-05 VITALS — BP 122/70 | HR 74 | Ht 61.0 in | Wt 179.0 lb

## 2022-02-05 DIAGNOSIS — R053 Chronic cough: Secondary | ICD-10-CM

## 2022-02-05 NOTE — Patient Instructions (Addendum)
- Agree with ENT recommendations - Keep up with the flovent 1 puff twice daily, rinse mouth after use - continue omeprazole 40 mg daily 30 min before breakfast and dinner. Lifestyle measures for reflux, weight loss.  - continue flonase after clearing nose of crusting following a shower - we'll see at next visit if we can scale back with flovent   Gastroesophageal Reflux Disease, Adult Gastroesophageal reflux (GER) happens when acid from the stomach flows up into the tube that connects the mouth and the stomach (esophagus). Normally, food travels down the esophagus and stays in the stomach to be digested. However, when a person has GER, food and stomach acid sometimes move back up into the esophagus. If this becomes a more serious problem, the person may be diagnosed with a disease called gastroesophageal reflux disease (GERD). GERD occurs when the reflux: Happens often. Causes frequent or severe symptoms. Causes problems such as damage to the esophagus. When stomach acid comes in contact with the esophagus, the acid may cause inflammation in the esophagus. Over time, GERD may create small holes (ulcers) in the lining of the esophagus. What are the causes? This condition is caused by a problem with the muscle between the esophagus and the stomach (lower esophageal sphincter, or LES). Normally, the LES muscle closes after food passes through the esophagus to the stomach. When the LES is weakened or abnormal, it does not close properly, and that allows food and stomach acid to go back up into the esophagus. The LES can be weakened by certain dietary substances, medicines, and medical conditions, including: Tobacco use. Pregnancy. Having a hiatal hernia. Alcohol use. Certain foods and beverages, such as coffee, chocolate, onions, and peppermint. What increases the risk? You are more likely to develop this condition if you: Have an increased body weight. Have a connective tissue disorder. Take  NSAIDs, such as ibuprofen. What are the signs or symptoms? Symptoms of this condition include: Heartburn. Difficult or painful swallowing and the feeling of having a lump in the throat. A bitter taste in the mouth. Bad breath and having a large amount of saliva. Having an upset or bloated stomach and belching. Chest pain. Different conditions can cause chest pain. Make sure you see your health care provider if you experience chest pain. Shortness of breath or wheezing. Ongoing (chronic) cough or a nighttime cough. Wearing away of tooth enamel. Weight loss. How is this diagnosed? This condition may be diagnosed based on a medical history and a physical exam. To determine if you have mild or severe GERD, your health care provider may also monitor how you respond to treatment. You may also have tests, including: A test to examine your stomach and esophagus with a small camera (endoscopy). A test that measures the acidity level in your esophagus. A test that measures how much pressure is on your esophagus. A barium swallow or modified barium swallow test to show the shape, size, and functioning of your esophagus. How is this treated? Treatment for this condition may vary depending on how severe your symptoms are. Your health care provider may recommend: Changes to your diet. Medicine. Surgery. The goal of treatment is to help relieve your symptoms and to prevent complications. Follow these instructions at home: Eating and drinking  Follow a diet as recommended by your health care provider. This may involve avoiding foods and drinks such as: Coffee and tea, with or without caffeine. Drinks that contain alcohol. Energy drinks and sports drinks. Carbonated drinks or sodas. Chocolate and  cocoa. Peppermint and mint flavorings. Garlic and onions. Horseradish. Spicy and acidic foods, including peppers, chili powder, curry powder, vinegar, hot sauces, and barbecue sauce. Citrus fruit juices  and citrus fruits, such as oranges, lemons, and limes. Tomato-based foods, such as red sauce, chili, salsa, and pizza with red sauce. Fried and fatty foods, such as donuts, french fries, potato chips, and high-fat dressings. High-fat meats, such as hot dogs and fatty cuts of red and white meats, such as rib eye steak, sausage, ham, and bacon. High-fat dairy items, such as whole milk, butter, and cream cheese. Eat small, frequent meals instead of large meals. Avoid drinking large amounts of liquid with your meals. Avoid eating meals during the 2-3 hours before bedtime. Avoid lying down right after you eat. Do not exercise right after you eat. Lifestyle  Do not use any products that contain nicotine or tobacco. These products include cigarettes, chewing tobacco, and vaping devices, such as e-cigarettes. If you need help quitting, ask your health care provider. Try to reduce your stress by using methods such as yoga or meditation. If you need help reducing stress, ask your health care provider. If you are overweight, reduce your weight to an amount that is healthy for you. Ask your health care provider for guidance about a safe weight loss goal. General instructions Pay attention to any changes in your symptoms. Take over-the-counter and prescription medicines only as told by your health care provider. Do not take aspirin, ibuprofen, or other NSAIDs unless your health care provider told you to take these medicines. Wear loose-fitting clothing. Do not wear anything tight around your waist that causes pressure on your abdomen. Raise (elevate) the head of your bed about 6 inches (15 cm). You can use a wedge to do this. Avoid bending over if this makes your symptoms worse. Keep all follow-up visits. This is important. Contact a health care provider if: You have: New symptoms. Unexplained weight loss. Difficulty swallowing or it hurts to swallow. Wheezing or a persistent cough. A hoarse  voice. Your symptoms do not improve with treatment. Get help right away if: You have sudden pain in your arms, neck, jaw, teeth, or back. You suddenly feel sweaty, dizzy, or light-headed. You have chest pain or shortness of breath. You vomit and the vomit is green, yellow, or black, or it looks like blood or coffee grounds. You faint. You have stool that is red, bloody, or black. You cannot swallow, drink, or eat. These symptoms may represent a serious problem that is an emergency. Do not wait to see if the symptoms will go away. Get medical help right away. Call your local emergency services (911 in the [place] Do not drive yourself to the hospital. Summary Gastroesophageal reflux happens when acid from the stomach flows up into the esophagus. GERD is a disease in which the reflux happens often, causes frequent or severe symptoms, or causes problems such as damage to the esophagus. Treatment for this condition may vary depending on how severe your symptoms are. Your health care provider may recommend diet and lifestyle changes, medicine, or surgery. Contact a health care provider if you have new or worsening symptoms. Take over-the-counter and prescription medicines only as told by your health care provider. Do not take aspirin, ibuprofen, or other NSAIDs unless your health care provider told you to do so. Keep all follow-up visits as told by your health care provider. This is important. This information is not intended to replace advice given to you by your  health care provider. Make sure you discuss any questions you have with your health care provider. Document Revised: 01/15/2020 Document Reviewed: 01/15/2020 Elsevier Patient Education  2023 [employer].

## 2022-02-12 ENCOUNTER — Encounter (INDEPENDENT_AMBULATORY_CARE_PROVIDER_SITE_OTHER): Payer: Self-pay | Admitting: Bariatrics

## 2022-02-12 ENCOUNTER — Ambulatory Visit (INDEPENDENT_AMBULATORY_CARE_PROVIDER_SITE_OTHER): Payer: Federal, State, Local not specified - PPO | Admitting: Bariatrics

## 2022-02-12 VITALS — BP 98/64 | HR 87 | Temp 98.0°F | Ht 61.0 in | Wt 173.0 lb

## 2022-02-12 DIAGNOSIS — R7303 Prediabetes: Secondary | ICD-10-CM | POA: Diagnosis not present

## 2022-02-12 DIAGNOSIS — Z7985 Long-term (current) use of injectable non-insulin antidiabetic drugs: Secondary | ICD-10-CM

## 2022-02-12 DIAGNOSIS — E669 Obesity, unspecified: Secondary | ICD-10-CM

## 2022-02-12 DIAGNOSIS — E559 Vitamin D deficiency, unspecified: Secondary | ICD-10-CM

## 2022-02-12 DIAGNOSIS — Z6832 Body mass index (BMI) 32.0-32.9, adult: Secondary | ICD-10-CM | POA: Diagnosis not present

## 2022-02-12 MED ORDER — VITAMIN D (ERGOCALCIFEROL) 1.25 MG (50000 UNIT) PO CAPS: 50000.0000 [IU] | ORAL_CAPSULE | ORAL | 0 refills | Status: DC

## 2022-02-12 MED ORDER — OZEMPIC (0.25 OR 0.5 MG/DOSE) 2 MG/1.5ML ~~LOC~~ SOPN: 0.5000 mg | PEN_INJECTOR | SUBCUTANEOUS | 0 refills | Status: DC

## 2022-02-15 ENCOUNTER — Encounter: Payer: Self-pay | Admitting: Student

## 2022-02-16 MED ORDER — FLUTICASONE PROPIONATE HFA 110 MCG/ACT IN AERO: 1.0000 | INHALATION_SPRAY | Freq: Two times a day (BID) | RESPIRATORY_TRACT | 12 refills | Status: DC

## 2022-02-16 NOTE — Telephone Encounter (Signed)
Dr Thora Lance, pt is asking for generic form of Flovent inhaler. Are you okay with prescribing this Please advise, thanks!

## 2022-02-24 ENCOUNTER — Other Ambulatory Visit (INDEPENDENT_AMBULATORY_CARE_PROVIDER_SITE_OTHER): Payer: Self-pay | Admitting: Bariatrics

## 2022-02-24 ENCOUNTER — Encounter (INDEPENDENT_AMBULATORY_CARE_PROVIDER_SITE_OTHER): Payer: Self-pay | Admitting: Bariatrics

## 2022-02-24 DIAGNOSIS — E559 Vitamin D deficiency, unspecified: Secondary | ICD-10-CM

## 2022-02-24 NOTE — Progress Notes (Signed)
Chief Complaint:   OBESITY Alexandra [name] is here to discuss her progress with her obesity treatment plan along with follow-up of her obesity related diagnoses. Alexandra [name] is on the Category 3 Plan and states she is following her eating plan approximately (unknown)% of the time. [name] states she is walking 6,000 steps 2 times per week.    Today's visit was #: 6 Starting weight: 192 lbs Starting date: 10/23/2021 Today's weight: 173 lbs Today's date: 02/12/2022 Total lbs lost to date: 19 Total lbs lost since last in-office visit: 2  Interim History: [name] is down an additional 2 pounds since her last visit.  Subjective:   1. Pre-diabetes Alexandra [name] is taking Ozempic as directed.  She denies side effects.  2. Vitamin D deficiency Alexandra [name] is taking prescription vitamin D as directed.  Assessment/Plan:   1. Pre-diabetes Alexandra [name] agreed to increase Ozempic to 0.5 mg once weekly and we will refill for 1 month.  - Semaglutide,0.25 or 0.5MG /DOS, (OZEMPIC, 0.25 OR 0.5 MG/DOSE,) 2 MG/1.5ML SOPN; Inject 0.5 mg into the skin once a week.  Dispense: 3 mL; Refill: 0  2. Vitamin D deficiency Alexandra [name] agreed to continue prescription vitamin D 50,000 units once weekly, and we will refill for 1 month.  - Vitamin D, Ergocalciferol, (DRISDOL) 1.25 MG (50000 UNIT) CAPS capsule; Take 1 capsule (50,000 Units total) by mouth every 7 (seven) days.  Dispense: 4 capsule; Refill: 0  3. Obesity, current BMI 32.8 Alexandra [name] is currently in the action stage of change. As such, her goal is to continue with weight loss efforts. She has agreed to the Category 3 Plan.   Meal planning and intentional eating were discussed.  We will recheck fasting labs at her next visit.  Exercise goals: As is.   Behavioral modification strategies: increasing lean protein intake, decreasing simple carbohydrates, increasing vegetables, increasing water intake, decreasing eating out, no skipping meals, meal planning and cooking strategies, keeping  healthy foods in the home, and planning for success.  Alexandra [name] has agreed to follow-up with our clinic in 2 to 3 weeks. She was informed of the importance of frequent follow-up visits to maximize her success with intensive lifestyle modifications for her multiple health conditions.   Objective:   Blood pressure 98/64, pulse 87, temperature 98 F (36.7 C), height 5\' 1"  (1.549 m), weight 173 lb (78.5 kg), SpO2 97 %. Body mass index is 32.69 kg/m.  General: Cooperative, alert, well developed, in no acute distress. HEENT: Conjunctivae and lids unremarkable. Cardiovascular: Regular rhythm.  Lungs: Normal work of breathing. Neurologic: No focal deficits.   Lab Results  Component Value Date   CREATININE 0.91 10/23/2021   BUN 11 10/23/2021   NA 141 10/23/2021   K 4.2 10/23/2021   CL 104 10/23/2021   CO2 23 10/23/2021   Lab Results  Component Value Date   ALT 14 10/23/2021   AST 17 10/23/2021   ALKPHOS 74 10/23/2021   BILITOT 0.3 10/23/2021   Lab Results  Component Value Date   HGBA1C 6.0 (H) 10/23/2021   Lab Results  Component Value Date   INSULIN 17.3 10/23/2021   Lab Results  Component Value Date   TSH 1.070 10/23/2021   Lab Results  Component Value Date   CHOL 179 10/23/2021   HDL 53 10/23/2021   LDLCALC 115 (H) 10/23/2021   TRIG 59 10/23/2021   Lab Results  Component Value Date   VD25OH 36.5 10/23/2021   Lab Results  Component Value Date   WBC 6.7 04/10/2021  HGB 12.1 04/10/2021   HCT 38.1 04/10/2021   MCV 83.9 04/10/2021   PLT 302.0 04/10/2021   No results found for: "IRON", "TIBC", "FERRITIN"  Attestation Statements:   Reviewed by clinician on day of visit: allergies, medications, problem list, medical history, surgical history, family history, social history, and previous encounter notes.  Trude Mcburney, am acting as Energy manager for [employer], DO.  I have reviewed the above documentation for accuracy and completeness, and I agree with  the above. Corinna Capra, DO

## 2022-02-25 ENCOUNTER — Encounter (INDEPENDENT_AMBULATORY_CARE_PROVIDER_SITE_OTHER): Payer: Self-pay

## 2022-03-06 ENCOUNTER — Telehealth: Payer: Self-pay | Admitting: Dermatology

## 2022-03-06 NOTE — Telephone Encounter (Signed)
Patient was called to tell her that her medical records were ready to be picked up and she stated that she never received a telephone call about her referral appointment to Isaac Laud, M.D.

## 2022-03-09 DIAGNOSIS — G4733 Obstructive sleep apnea (adult) (pediatric): Secondary | ICD-10-CM | POA: Diagnosis not present

## 2022-03-09 NOTE — Telephone Encounter (Signed)
Phone call to patient to let her know that we did her referral to [facility] and that it could be a year before she could get an appointment.

## 2022-03-12 ENCOUNTER — Ambulatory Visit (INDEPENDENT_AMBULATORY_CARE_PROVIDER_SITE_OTHER): Payer: Federal, State, Local not specified - PPO | Admitting: Adult Health

## 2022-03-12 ENCOUNTER — Encounter (INDEPENDENT_AMBULATORY_CARE_PROVIDER_SITE_OTHER): Payer: Self-pay | Admitting: Adult Health

## 2022-03-12 VITALS — BP 98/64 | HR 67 | Temp 97.9°F | Ht 61.0 in | Wt 171.0 lb

## 2022-03-12 DIAGNOSIS — E669 Obesity, unspecified: Secondary | ICD-10-CM | POA: Diagnosis not present

## 2022-03-12 DIAGNOSIS — R7303 Prediabetes: Secondary | ICD-10-CM | POA: Diagnosis not present

## 2022-03-12 DIAGNOSIS — Z9189 Other specified personal risk factors, not elsewhere classified: Secondary | ICD-10-CM

## 2022-03-12 DIAGNOSIS — Z6832 Body mass index (BMI) 32.0-32.9, adult: Secondary | ICD-10-CM | POA: Diagnosis not present

## 2022-03-12 DIAGNOSIS — E559 Vitamin D deficiency, unspecified: Secondary | ICD-10-CM | POA: Diagnosis not present

## 2022-03-12 MED ORDER — OZEMPIC (0.25 OR 0.5 MG/DOSE) 2 MG/1.5ML ~~LOC~~ SOPN: 0.5000 mg | PEN_INJECTOR | SUBCUTANEOUS | 0 refills | Status: DC

## 2022-03-14 NOTE — Progress Notes (Unsigned)
Chief Complaint:   OBESITY Alexandra [name] is here to discuss her progress with her obesity treatment plan along with follow-up of her obesity related diagnoses. [name] is on the Category 3 Plan and states she is following her eating plan approximately 85% of the time. [name] states she is walking  20 minutes 3 times per week.  Today's visit was #: 7 Starting weight: 192 lbs Starting date: 10/23/2021 Today's weight: 173 lbs Today's date: 03/12/2022 Total lbs lost to date: 19 years Total lbs lost since last in-office visit: 2 lbs  Interim History: She started on Ozempic on 11/06/2021.  Titrated up to Ozempic 0.5 mg, has had 6 doses on this strength.  Her mother has been hospitalized since early June (craniotomy).  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   Subjective:   1. Vitamin D deficiency 10/23/2021, Vitamin D level 36.5.  2. Pre-diabetes She denies family history of MENS 2 or MTC.  She denies personal history of pancreatitis.  10/23/2021, A1c, 6.0.  Insulin level 17.3.  3. At risk for diabetes mellitus [name] is at higher than average risk for developing diabetes due to her obesity and prediabetes.    Assessment/Plan:   1. Vitamin D deficiency Continue Ergocalciferol, no need for refill today.   2. Pre-diabetes Refill - Semaglutide,0.25 or 0.5MG /DOS, (OZEMPIC, 0.25 OR 0.5 MG/DOSE,) 2 MG/1.5ML SOPN; Inject 0.5 mg into the skin once a week.  Dispense: 3 mL; Refill: 0  3. At risk for diabetes mellitus [name] was given approximately 15 minutes of diabetic education and counseling today. We discussed intensive lifestyle modifications today with an emphasis on weight loss as well as increasing exercise and decreasing simple carbohydrates in her diet. We also reviewed medication options with an emphasis on risk versus benefits of those discussed.  Repetitive spaced learning was employed today to elicit superior memory formation and behavioral change.   4. Obesity, current BMI  32.4 [name] is currently in the action stage of change. As such, her goal is to continue with weight loss efforts. She has agreed to the Category 3 Plan and keeping a food journal and adhering to recommended goals of 450-600  calories and 40 g protein at supper.   Exercise goals:  As is.   Behavioral modification strategies: increasing lean protein intake, decreasing simple carbohydrates, meal planning and cooking strategies, keeping healthy foods in the home, and planning for success.  [name] has agreed to follow-up with our clinic in 3 weeks. She was informed of the importance of frequent follow-up visits to maximize her success with intensive lifestyle modifications for her multiple health conditions.   Objective:   Blood pressure 98/64, pulse 67, temperature 97.9 F (36.6 C), height 5\' 1"  (1.549 m), weight 171 lb (77.6 kg), SpO2 99 %. Body mass index is 32.31 kg/m.  General: Cooperative, alert, well developed, in no acute distress. HEENT: Conjunctivae and lids unremarkable. Cardiovascular: Regular rhythm.  Lungs: Normal work of breathing. Neurologic: No focal deficits.   Lab Results  Component Value Date   CREATININE 0.91 10/23/2021   BUN 11 10/23/2021   NA 141 10/23/2021   K 4.2 10/23/2021   CL 104 10/23/2021   CO2 23 10/23/2021   Lab Results  Component Value Date   ALT 14 10/23/2021   AST 17 10/23/2021   ALKPHOS 74 10/23/2021   BILITOT 0.3 10/23/2021   Lab Results  Component Value Date   HGBA1C 6.0 (H) 10/23/2021   Lab Results  Component Value Date  INSULIN 17.3 10/23/2021   Lab Results  Component Value Date   TSH 1.070 10/23/2021   Lab Results  Component Value Date   CHOL 179 10/23/2021   HDL 53 10/23/2021   LDLCALC 115 (H) 10/23/2021   TRIG 59 10/23/2021   Lab Results  Component Value Date   VD25OH 36.5 10/23/2021   Lab Results  Component Value Date   WBC 6.7 04/10/2021   HGB 12.1 04/10/2021   HCT 38.1 04/10/2021   MCV 83.9 04/10/2021   PLT  302.0 04/10/2021   No results found for: "IRON", "TIBC", "FERRITIN"  Attestation Statements:   Reviewed by clinician on day of visit: allergies, medications, problem list, medical history, surgical history, family history, social history, and previous encounter notes.  I, Malcolm Metro, RMA, am acting as Energy manager for William Hamburger, NP.  I have reviewed the above documentation for accuracy and completeness, and I agree with the above. -  ***

## 2022-03-27 ENCOUNTER — Other Ambulatory Visit (INDEPENDENT_AMBULATORY_CARE_PROVIDER_SITE_OTHER): Payer: Self-pay | Admitting: Bariatrics

## 2022-03-27 DIAGNOSIS — E559 Vitamin D deficiency, unspecified: Secondary | ICD-10-CM

## 2022-03-31 NOTE — Progress Notes (Signed)
Synopsis: Referred for cough by Deatra James, MD  Subjective:   PATIENT ID: Alexandra [name] GENDER: female DOB: 03/09/1966, MRN: [id number]  No chief complaint on file. CC: dyspnea on exertion   55yF with history of covid-19 infection 07/2020, cough/VCD followed by speech. She was vaccinated with pfizer and booster. Never had asthma as a kid. Never has ahd sinus surgery  She was not hospitalized but was out of work for [employer] dealing with it. Never got any specific treatment for it.   She has cough that is sometimes dry, sometimes productive of clearish sputum. SHe is seeing speech but they haven't done FNL. She doesn't have accompanying throat tightness but she does have associated hoarseness. Gets occasional chest tightness which is improved with albuterol. She periodically has PND but it is getting better (2-3x per week still). She does have some sinonasal congestion.  She has no reflux/heartburn symptoms.   She does think a course of prednisone imrpoved her cough when she got it.  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 She has no exposure to dusts/solvents/particulates without mask other than occasional candle-making. She has a dog. No pet bird. No hot tub. Never has lived outside of [place].  She never smoked, vaped.   Interval HPI: Seen by ENT - FNL with arytenoid edemaerythema, suspected LPR and chronic throat clearing. Increased to BID PPI, revisited TLC measures for gerd, throat clearing. She's working on these measures.   Her mom has developed renal failure since has visit, has been hospitalized and her course has been complicated by need for craniectomy, now in stepdown unit.   Despite this stressor, after making changes above she thinks she's doing better.   Given prescription for flovent 110 1 puff BID for possible NAEB. She has started this. She takes her ppi right after  this and drinks water to clear her throat.   ------- Continued on flovent, omeprazole 40 bid, flonase at last visit.   Cough is better than at last visit. She is interested in trying to scale back ppi if possible. Still using flonase. Really doesn't have much in way of sinus congestion/postnasal drainage.    Otherwise pertinent review of systems is negative.    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, unspecified    Seasonal allergies    Sleep apnea    Spasm      Family History  Problem Relation Age of Onset   Breast cancer Mother    Depression Mother    High Cholesterol Mother    Obesity Mother    High blood pressure Father    Sleep apnea Father    Breast cancer Maternal Aunt      Past Surgical History:  Procedure Laterality Date   CESAREAN SECTION     CHOLECYSTECTOMY      Social History   Socioeconomic History   Marital status: Married    Spouse name: Tinnie Gens   Number of children: Not on file   Years of education: Not on file   Highest education level: Not on file  Occupational History   Occupation: Nurse  Tobacco Use   Smoking status: Never   Smokeless tobacco: Never  Substance and Sexual Activity   Alcohol use: Yes   Drug use: Never   Sexual activity: Not on file  Other Topics Concern   Not on  file  Social History Narrative   Not on file   Social Determinants of Health   Financial Resource Strain: Not on file  Food Insecurity: Not on file  Transportation Needs: Not on file  Physical Activity: Not on file  Stress: Not on file  Social Connections: Not on file  Intimate Partner Violence: Not on file     Allergies  Allergen Reactions   Penicillins Swelling    Patient reports he tongue swelled.     Outpatient Medications Prior to Visit  Medication Sig Dispense Refill   albuterol (VENTOLIN HFA) 108 (90 Base) MCG/ACT inhaler SMARTSIG:1 Puff(s) By Mouth Every 4 Hours PRN     clobetasol (OLUX) 0.05 % topical foam Apply  topically 2 (two) times daily. 50 g 5   fluticasone (FLONASE) 50 MCG/ACT nasal spray SHAKE LIQUID AND USE 1 SPRAY IN EACH NOSTRIL DAILY 16 g 11   fluticasone (FLOVENT HFA) 110 MCG/ACT inhaler Inhale 1 puff into the lungs in the morning and at bedtime. 1 each 12   omeprazole (PRILOSEC) 40 MG capsule Take 1 capsule (40 mg total) by mouth daily. (Patient taking differently: Take 40 mg by mouth in the morning and at bedtime.) 90 capsule 0   Spacer/Aero-Holding Chambers (AEROCHAMBER MV) inhaler Use as instructed 1 each 0   tacrolimus (PROTOPIC) 0.1 % ointment Apply topically at bedtime. 100 g 3   Vitamin D, Ergocalciferol, (DRISDOL) 1.25 MG (50000 UNIT) CAPS capsule Take 1 capsule (50,000 Units total) by mouth every 7 (seven) days. 4 capsule 0   No facility-administered medications prior to visit.       Objective:   Physical Exam:  General appearance: 56 y.o., female, NAD, conversant  Eyes: anicteric sclerae; PERRL, tracking appropriately HENT: NCAT; MMM Neck: Trachea midline; no lymphadenopathy, no JVD Lungs: CTAB, no crackles, no wheeze, with normal respiratory effort CV: RRR, no murmur  Abdomen: Soft, non-tender; non-distended, BS present  Extremities: No peripheral edema, warm Skin: Normal turgor and texture; no rash Psych: Appropriate affect Neuro: Alert and oriented to person and place, no focal deficit     Vitals:   04/02/22 1407  BP: 116/88  Pulse: 87  SpO2: 98%  Weight: 171 lb (77.6 kg)  Height: 5\' 1"  (1.549 m)        98% on RA BMI Readings from Last 3 Encounters:  04/02/22 32.31 kg/m  04/02/22 31.74 kg/m  03/12/22 32.31 kg/m   Wt Readings from Last 3 Encounters:  04/02/22 171 lb (77.6 kg)  04/02/22 168 lb (76.2 kg)  03/12/22 171 lb (77.6 kg)     CBC    Component Value Date/Time   WBC 6.7 04/10/2021 1519   RBC 4.55 04/10/2021 1519   HGB 12.1 04/10/2021 1519   HCT 38.1 04/10/2021 1519   PLT 302.0 04/10/2021 1519   MCV 83.9 04/10/2021 1519    MCHC 31.8 04/10/2021 1519   RDW 14.4 04/10/2021 1519   LYMPHSABS 2.0 04/10/2021 1519   MONOABS 0.6 04/10/2021 1519   EOSABS 0.1 04/10/2021 1519   BASOSABS 0.1 04/10/2021 1519      Chest Imaging: CXR 01/2021 reviewed by me and unremarkable  Pulmonary Functions Testing Results:    Latest Ref Rng & Units 05/29/2021    9:44 AM 04/24/2021   12:08 PM  PFT Results  FVC-Pre L 2.17  P 2.19   FVC-Predicted Pre % 90  P 91   FVC-Post L 2.19  P 2.24   FVC-Predicted Post % 91  P 93   Pre  FEV1/FVC % % 89  P 85   Post FEV1/FCV % % 87  P 83   FEV1-Pre L 1.94  P 1.85   FEV1-Predicted Pre % 101  P 97   FEV1-Post L 1.91  P 1.85   DLCO uncorrected ml/min/mmHg  19.83   DLCO UNC% %  109   DLCO corrected ml/min/mmHg  19.83   DLCO COR %Predicted %  109   DLVA Predicted %  132   TLC L  4.11   TLC % Predicted %  92   RV % Predicted %  111     P Preliminary result    05/29/21 methacholine challenge reviewed by me negative for airway hyperresponsiveness     Assessment & Plan:   # Chronic cough GERD/LPR had seemed like dominant issue. Speech had seen with some concern for component of VCD but not seen on FNL by ENT (albeit not during episode of dyspnea/throat tightness). Less likely but possible is a component of NAEB - she is unsure how much of her improvement in cough is due to ramped up gerd mgmt vs flovent. Cough has improved with bid ppi, flovent but she really thinks flovent is what's helped the most.  # DOE Her DOE could reflect deconditioning, CHF but not volume overloaded on exam and BNP in past WNL, PH, undertreated OSA among other possibilities.   # OSA on CPAP  Plan: - We will schedule cardiopulmonary exercise test and clinic visit right after  - Decrease to omeprazole 40 mg daily 30 minutes before breakfast. If cough worsens after decreasing dose then would resume twice daily and would seek appointment with GI - Continue flovent, flonase     RTC 8 weeks  Maryjane Hurter,  MD [facility] 04/02/2022 2:39 PM

## 2022-04-02 ENCOUNTER — Encounter (INDEPENDENT_AMBULATORY_CARE_PROVIDER_SITE_OTHER): Payer: Self-pay | Admitting: Adult Health

## 2022-04-02 ENCOUNTER — Ambulatory Visit (INDEPENDENT_AMBULATORY_CARE_PROVIDER_SITE_OTHER): Payer: Federal, State, Local not specified - PPO | Admitting: Adult Health

## 2022-04-02 ENCOUNTER — Encounter: Payer: Self-pay | Admitting: Student

## 2022-04-02 ENCOUNTER — Ambulatory Visit: Payer: Federal, State, Local not specified - PPO | Admitting: Student

## 2022-04-02 VITALS — BP 98/64 | HR 61 | Temp 98.3°F | Ht 61.0 in | Wt 168.0 lb

## 2022-04-02 VITALS — BP 116/88 | HR 87 | Ht 61.0 in | Wt 171.0 lb

## 2022-04-02 DIAGNOSIS — R053 Chronic cough: Secondary | ICD-10-CM | POA: Diagnosis not present

## 2022-04-02 DIAGNOSIS — E559 Vitamin D deficiency, unspecified: Secondary | ICD-10-CM

## 2022-04-02 DIAGNOSIS — R0609 Other forms of dyspnea: Secondary | ICD-10-CM

## 2022-04-02 DIAGNOSIS — E669 Obesity, unspecified: Secondary | ICD-10-CM

## 2022-04-02 DIAGNOSIS — G4733 Obstructive sleep apnea (adult) (pediatric): Secondary | ICD-10-CM

## 2022-04-02 DIAGNOSIS — Z9989 Dependence on other enabling machines and devices: Secondary | ICD-10-CM | POA: Diagnosis not present

## 2022-04-02 DIAGNOSIS — Z6831 Body mass index (BMI) 31.0-31.9, adult: Secondary | ICD-10-CM

## 2022-04-02 DIAGNOSIS — R7303 Prediabetes: Secondary | ICD-10-CM | POA: Diagnosis not present

## 2022-04-02 MED ORDER — OZEMPIC (0.25 OR 0.5 MG/DOSE) 2 MG/1.5ML ~~LOC~~ SOPN: 0.5000 mg | PEN_INJECTOR | SUBCUTANEOUS | 0 refills | Status: DC

## 2022-04-02 MED ORDER — VITAMIN D (ERGOCALCIFEROL) 1.25 MG (50000 UNIT) PO CAPS: 50000.0000 [IU] | ORAL_CAPSULE | ORAL | 0 refills | Status: DC

## 2022-04-02 NOTE — Patient Instructions (Signed)
-   We will schedule cardiopulmonary exercise test and clinic visit right after  - Decrease to omeprazole 40 mg daily 30 minutes before breakfast. If cough worsens after decreasing dose then would resume twice daily and would seek appointment with GI - Continue flovent, flonase

## 2022-04-05 NOTE — Progress Notes (Unsigned)
Chief Complaint:   OBESITY Alexandra [name] is here to discuss her progress with her obesity treatment plan along with follow-up of her obesity related diagnoses. Alexandra [name] is on the Category 3 Plan and states she is following her eating plan approximately 95% of the time. Alexandra [name] states she is walking 30 minutes 3 times per week.  Today's visit was #: 8 Starting weight: 192 lbs Starting date: 10/23/2021 Today's weight: 168 lbs Today's date: 04/02/2022 Total lbs lost to date: 24 lbs Total lbs lost since last in-office visit: 3 lbs  Interim History: Her mother will be discharged home to her home today.   Subjective:   1. Vitamin D deficiency 10/23/2021, Vitamin D level 36.5  2. Pre-diabetes 10/23/2021, A1c 6.0   Assessment/Plan:   1. Vitamin D deficiency Refill - Vitamin D, Ergocalciferol, (DRISDOL) 1.25 MG (50000 UNIT) CAPS capsule; Take 1 capsule (50,000 Units total) by mouth every 7 (seven) days.  Dispense: 4 capsule; Refill: 0  2. Pre-diabetes Refill - Semaglutide,0.25 or 0.5MG /DOS, (OZEMPIC, 0.25 OR 0.5 MG/DOSE,) 2 MG/1.5ML SOPN; Inject 0.5 mg into the skin once a week. (Patient not taking: Reported on 04/02/2022)  Dispense: 3 mL; Refill: 0  3. Obesity, current BMI 31.8 Alexandra [name] is currently in the action stage of change. As such, her goal is to continue with weight loss efforts. She has agreed to the Category 3 Plan.   Exercise goals:  As is.  Behavioral modification strategies: increasing lean protein intake, decreasing simple carbohydrates, meal planning and cooking strategies, keeping healthy foods in the home, and planning for success.  Alexandra [name] has agreed to follow-up with our clinic in 4 weeks. She was informed of the importance of frequent follow-up visits to maximize her success with intensive lifestyle modifications for her multiple health conditions.   Objective:   Blood pressure 98/64, pulse 61, temperature 98.3 F (36.8 C), height 5\' 1"  (1.549 m), weight 168 lb (76.2  kg), SpO2 98 %. Body mass index is 31.74 kg/m.  General: Cooperative, alert, well developed, in no acute distress. HEENT: Conjunctivae and lids unremarkable. Cardiovascular: Regular rhythm.  Lungs: Normal work of breathing. Neurologic: No focal deficits.   Lab Results  Component Value Date   CREATININE 0.91 10/23/2021   BUN 11 10/23/2021   NA 141 10/23/2021   K 4.2 10/23/2021   CL 104 10/23/2021   CO2 23 10/23/2021   Lab Results  Component Value Date   ALT 14 10/23/2021   AST 17 10/23/2021   ALKPHOS 74 10/23/2021   BILITOT 0.3 10/23/2021   Lab Results  Component Value Date   HGBA1C 6.0 (H) 10/23/2021   Lab Results  Component Value Date   INSULIN 17.3 10/23/2021   Lab Results  Component Value Date   TSH 1.070 10/23/2021   Lab Results  Component Value Date   CHOL 179 10/23/2021   HDL 53 10/23/2021   LDLCALC 115 (H) 10/23/2021   TRIG 59 10/23/2021   Lab Results  Component Value Date   VD25OH 36.5 10/23/2021   Lab Results  Component Value Date   WBC 6.7 04/10/2021   HGB 12.1 04/10/2021   HCT 38.1 04/10/2021   MCV 83.9 04/10/2021   PLT 302.0 04/10/2021   No results found for: "IRON", "TIBC", "FERRITIN"  Attestation Statements:   Reviewed by clinician on day of visit: allergies, medications, problem list, medical history, surgical history, family history, social history, and previous encounter notes.  I, Davy Pique, RMA, am acting as Location manager for Mina Marble, NP.  I have reviewed the above documentation for accuracy and completeness, and I agree with the above. -  ***

## 2022-04-13 ENCOUNTER — Encounter (HOSPITAL_COMMUNITY): Payer: Federal, State, Local not specified - PPO

## 2022-04-24 ENCOUNTER — Ambulatory Visit (HOSPITAL_COMMUNITY): Payer: Federal, State, Local not specified - PPO | Attending: Student

## 2022-04-24 DIAGNOSIS — R0609 Other forms of dyspnea: Secondary | ICD-10-CM | POA: Diagnosis not present

## 2022-04-28 NOTE — Progress Notes (Signed)
Synopsis: Referred for cough by Donald Prose, MD  Subjective:   PATIENT ID: Alexandra [name] GENDER: female DOB: 03-10-1966, MRN: [id number]  Chief Complaint  Patient presents with   Follow-up  CC: dyspnea on exertion   55yF with history of covid-19 infection 07/2020, cough/VCD followed by speech. She was vaccinated with pfizer and booster. Never had asthma as a kid. Never has ahd sinus surgery  She was not hospitalized but was out of work for [employer] dealing with it. Never got any specific treatment for it.   She has cough that is sometimes dry, sometimes productive of clearish sputum. SHe is seeing speech but they haven't done FNL. She doesn't have accompanying throat tightness but she does have associated hoarseness. Gets occasional chest tightness which is improved with albuterol. She periodically has PND but it is getting better (2-3x per week still). She does have some sinonasal congestion.  She has no reflux/heartburn symptoms.   She does think a course of prednisone imrpoved her cough when she got it.  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 She has no exposure to dusts/solvents/particulates without mask other than occasional candle-making. She has a dog. No pet bird. No hot tub. Never has lived outside of [place].  She never smoked, vaped.   Interval HPI:   CPEX with max vo2 111% predicted, adequate RER, end-exercise hyperventilation, borderline BR  She says overall she's doing fine since last visit. Cough/throat clearing maybe a bit worse since cutting back to ppi once daily in evening before dinner.   Stuffy nose with CPAP lately after decreasing humidification.   Otherwise pertinent review of systems is negative.    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, unspecified    Seasonal allergies    Sleep apnea    Spasm      Family History   Problem Relation Age of Onset   Breast cancer Mother    Depression Mother    High Cholesterol Mother    Obesity Mother    High blood pressure Father    Sleep apnea Father    Breast cancer Maternal Aunt      Past Surgical History:  Procedure Laterality Date   CESAREAN SECTION     CHOLECYSTECTOMY      Social History   Socioeconomic History   Marital status: Married    Spouse name: Dellis Filbert   Number of children: Not on file   Years of education: Not on file   Highest education level: Not on file  Occupational History   Occupation: Nurse  Tobacco Use   Smoking status: Never   Smokeless tobacco: Never  Substance and Sexual Activity   Alcohol use: Yes   Drug use: Never   Sexual activity: Not on file  Other Topics Concern   Not on file  Social History Narrative   Not on file   Social Determinants of Health   Financial Resource Strain: Not on file  Food Insecurity: Not on file  Transportation Needs: Not on file  Physical Activity: Not on file  Stress: Not on file  Social Connections: Not on file  Intimate Partner Violence: Not on file     Allergies  Allergen Reactions   Penicillins Swelling    Patient reports he tongue swelled.     Outpatient Medications Prior to Visit  Medication Sig Dispense Refill  albuterol (VENTOLIN HFA) 108 (90 Base) MCG/ACT inhaler SMARTSIG:1 Puff(s) By Mouth Every 4 Hours PRN     fluticasone (FLONASE) 50 MCG/ACT nasal spray SHAKE LIQUID AND USE 1 SPRAY IN EACH NOSTRIL DAILY 16 g 11   fluticasone (FLOVENT HFA) 110 MCG/ACT inhaler Inhale 1 puff into the lungs in the morning and at bedtime. 1 each 12   Spacer/Aero-Holding Chambers (AEROCHAMBER MV) inhaler Use as instructed 1 each 0   clobetasol (OLUX) 0.05 % topical foam Apply topically 2 (two) times daily. 50 g 5   omeprazole (PRILOSEC) 40 MG capsule Take 1 capsule (40 mg total) by mouth daily. (Patient taking differently: Take 40 mg by mouth in the morning and at bedtime.) 90 capsule 0    Semaglutide,0.25 or 0.5MG /DOS, (OZEMPIC, 0.25 OR 0.5 MG/DOSE,) 2 MG/1.5ML SOPN Inject 0.5 mg into the skin once a week. (Patient not taking: Reported on 04/02/2022) 3 mL 0   tacrolimus (PROTOPIC) 0.1 % ointment Apply topically at bedtime. 100 g 3   Vitamin D, Ergocalciferol, (DRISDOL) 1.25 MG (50000 UNIT) CAPS capsule Take 1 capsule (50,000 Units total) by mouth every 7 (seven) days. 4 capsule 0   No facility-administered medications prior to visit.       Objective:   Physical Exam:  General appearance: 56 y.o., female, NAD, conversant  Eyes: anicteric sclerae; PERRL, tracking appropriately HENT: NCAT; MMM Neck: Trachea midline; no lymphadenopathy, no JVD Lungs: CTAB, no crackles, no wheeze, with normal respiratory effort CV: RRR, no murmur  Abdomen: Soft, non-tender; non-distended, BS present  Extremities: No peripheral edema, warm Skin: Normal turgor and texture; no rash Psych: Appropriate affect Neuro: Alert and oriented to person and place, no focal deficit      Vitals:   04/30/22 1309  BP: 100/70  Pulse: 68  SpO2: 98%  Weight: 169 lb 12.8 oz (77 kg)  Height: 5\' 1"  (1.549 m)         98% on RA BMI Readings from Last 3 Encounters:  04/30/22 32.08 kg/m  04/02/22 32.31 kg/m  04/02/22 31.74 kg/m   Wt Readings from Last 3 Encounters:  04/30/22 169 lb 12.8 oz (77 kg)  04/02/22 171 lb (77.6 kg)  04/02/22 168 lb (76.2 kg)     CBC    Component Value Date/Time   WBC 6.7 04/10/2021 1519   RBC 4.55 04/10/2021 1519   HGB 12.1 04/10/2021 1519   HCT 38.1 04/10/2021 1519   PLT 302.0 04/10/2021 1519   MCV 83.9 04/10/2021 1519   MCHC 31.8 04/10/2021 1519   RDW 14.4 04/10/2021 1519   LYMPHSABS 2.0 04/10/2021 1519   MONOABS 0.6 04/10/2021 1519   EOSABS 0.1 04/10/2021 1519   BASOSABS 0.1 04/10/2021 1519      Chest Imaging: CXR 01/2021 reviewed by me and unremarkable  Pulmonary Functions Testing Results:    Latest Ref Rng & Units 05/29/2021    9:44 AM  04/24/2021   12:08 PM  PFT Results  FVC-Pre L 2.17  P 2.19   FVC-Predicted Pre % 90  P 91   FVC-Post L 2.19  P 2.24   FVC-Predicted Post % 91  P 93   Pre FEV1/FVC % % 89  P 85   Post FEV1/FCV % % 87  P 83   FEV1-Pre L 1.94  P 1.85   FEV1-Predicted Pre % 101  P 97   FEV1-Post L 1.91  P 1.85   DLCO uncorrected ml/min/mmHg  19.83   DLCO UNC% %  109   DLCO  corrected ml/min/mmHg  19.83   DLCO COR %Predicted %  109   DLVA Predicted %  132   TLC L  4.11   TLC % Predicted %  92   RV % Predicted %  111     P Preliminary result    05/29/21 methacholine challenge reviewed by me negative for airway hyperresponsiveness     Assessment & Plan:   # Chronic cough GERD/LPR had seemed like dominant issue. Speech had seen with some concern for component of VCD but not seen on FNL by ENT (albeit not during episode of dyspnea/throat tightness). Less likely but possible is a component of NAEB. Cough has improved with bid ppi, flovent.  # DOE She actually has good exercise capacity on CPEX. Borderline low BR but CXR is unremarkable and PFTs are WNL  # OSA on CPAP  Plan: - Can try ayr nasal gel up to a few times a day as needed, alter the humidification for your CPAP, nasal irrigation with neil med salt pack and distilled water with neti pot with flonase - Continue omeprazole 40 mg 30 minutes before dinner  - Can try stopping flovent - resume if cough worsens after stopping - continue flonase - I'll let you know when I hear back with final read of CPEX     RTC 8 weeks  Maryjane Hurter, MD [facility] 04/30/2022 1:21 PM

## 2022-04-30 ENCOUNTER — Encounter: Payer: Self-pay | Admitting: Student

## 2022-04-30 ENCOUNTER — Ambulatory Visit: Payer: Federal, State, Local not specified - PPO | Admitting: Student

## 2022-04-30 VITALS — BP 100/70 | HR 68 | Ht 61.0 in | Wt 169.8 lb

## 2022-04-30 DIAGNOSIS — K219 Gastro-esophageal reflux disease without esophagitis: Secondary | ICD-10-CM | POA: Diagnosis not present

## 2022-04-30 MED ORDER — AYR SALINE NASAL NO-DRIP NA GEL: 1.0000 | Freq: Three times a day (TID) | NASAL | 11 refills | Status: DC | PRN

## 2022-04-30 NOTE — Patient Instructions (Addendum)
-   Can try ayr nasal gel up to a few times a day as needed, alter the humidification for your CPAP, nasal irrigation with neil med salt pack and distilled water with neti pot with flonase - Continue omeprazole 40 mg 30 minutes before dinner  - Can try stopping flovent - resume if cough worsens after stopping - continue flonase - I'll let you know when I hear back from Dr. Haroldine Laws or Dr. Vaughan Browner with their final read of your CPEX

## 2022-05-04 ENCOUNTER — Other Ambulatory Visit (INDEPENDENT_AMBULATORY_CARE_PROVIDER_SITE_OTHER): Payer: Self-pay | Admitting: Adult Health

## 2022-05-04 DIAGNOSIS — E559 Vitamin D deficiency, unspecified: Secondary | ICD-10-CM

## 2022-05-07 ENCOUNTER — Encounter (INDEPENDENT_AMBULATORY_CARE_PROVIDER_SITE_OTHER): Payer: Self-pay | Admitting: Adult Health

## 2022-05-07 ENCOUNTER — Ambulatory Visit (INDEPENDENT_AMBULATORY_CARE_PROVIDER_SITE_OTHER): Payer: Federal, State, Local not specified - PPO | Admitting: Adult Health

## 2022-05-07 VITALS — BP 98/65 | HR 70 | Temp 97.9°F | Ht 61.0 in | Wt 165.0 lb

## 2022-05-07 DIAGNOSIS — E669 Obesity, unspecified: Secondary | ICD-10-CM

## 2022-05-07 DIAGNOSIS — E559 Vitamin D deficiency, unspecified: Secondary | ICD-10-CM | POA: Diagnosis not present

## 2022-05-07 DIAGNOSIS — Z6831 Body mass index (BMI) 31.0-31.9, adult: Secondary | ICD-10-CM

## 2022-05-07 DIAGNOSIS — R7303 Prediabetes: Secondary | ICD-10-CM

## 2022-05-07 DIAGNOSIS — E7800 Pure hypercholesterolemia, unspecified: Secondary | ICD-10-CM

## 2022-05-07 MED ORDER — OZEMPIC (0.25 OR 0.5 MG/DOSE) 2 MG/1.5ML ~~LOC~~ SOPN: 0.5000 mg | PEN_INJECTOR | SUBCUTANEOUS | 0 refills | Status: DC

## 2022-05-07 MED ORDER — VITAMIN D (ERGOCALCIFEROL) 1.25 MG (50000 UNIT) PO CAPS: 50000.0000 [IU] | ORAL_CAPSULE | ORAL | 0 refills | Status: DC

## 2022-05-08 ENCOUNTER — Encounter (INDEPENDENT_AMBULATORY_CARE_PROVIDER_SITE_OTHER): Payer: Self-pay | Admitting: Adult Health

## 2022-05-08 LAB — HEMOGLOBIN A1C
Est. average glucose Bld gHb Est-mCnc: 126 mg/dL
Hgb A1c MFr Bld: 6 % — ABNORMAL HIGH (ref 4.8–5.6)

## 2022-05-08 LAB — COMPREHENSIVE METABOLIC PANEL
ALT: 12 IU/L (ref 0–32)
AST: 16 IU/L (ref 0–40)
Albumin/Globulin Ratio: 1.8 (ref 1.2–2.2)
Albumin: 4.6 g/dL (ref 3.8–4.9)
Alkaline Phosphatase: 82 IU/L (ref 44–121)
BUN/Creatinine Ratio: 13 (ref 9–23)
BUN: 13 mg/dL (ref 6–24)
Bilirubin Total: 0.3 mg/dL (ref 0.0–1.2)
CO2: 23 mmol/L (ref 20–29)
Calcium: 9.3 mg/dL (ref 8.7–10.2)
Chloride: 104 mmol/L (ref 96–106)
Creatinine, Ser: 0.97 mg/dL (ref 0.57–1.00)
Globulin, Total: 2.6 g/dL (ref 1.5–4.5)
Glucose: 85 mg/dL (ref 70–99)
Potassium: 4.7 mmol/L (ref 3.5–5.2)
Sodium: 143 mmol/L (ref 134–144)
Total Protein: 7.2 g/dL (ref 6.0–8.5)
eGFR: 69 mL/min/{1.73_m2} (ref >59)

## 2022-05-08 LAB — VITAMIN D 25 HYDROXY (VIT D DEFICIENCY, FRACTURES): Vit D, 25-Hydroxy: 59.9 ng/mL (ref 30.0–100.0)

## 2022-05-08 LAB — LIPID PANEL
Chol/HDL Ratio: 3.2 ratio (ref 0.0–4.4)
Cholesterol, Total: 161 mg/dL (ref 100–199)
HDL: 51 mg/dL (ref >39)
LDL Chol Calc (NIH): 99 mg/dL (ref 0–99)
Triglycerides: 53 mg/dL (ref 0–149)
VLDL Cholesterol Cal: 11 mg/dL (ref 5–40)

## 2022-05-08 LAB — INSULIN, RANDOM: INSULIN: 19.9 u[IU]/mL (ref 2.6–24.9)

## 2022-05-11 ENCOUNTER — Encounter (INDEPENDENT_AMBULATORY_CARE_PROVIDER_SITE_OTHER): Payer: Self-pay | Admitting: Adult Health

## 2022-05-13 DIAGNOSIS — F32A Depression, unspecified: Secondary | ICD-10-CM | POA: Diagnosis not present

## 2022-05-14 DIAGNOSIS — E7800 Pure hypercholesterolemia, unspecified: Secondary | ICD-10-CM | POA: Insufficient documentation

## 2022-05-14 NOTE — Progress Notes (Signed)
Chief Complaint:   OBESITY Alexandra [name] is here to discuss her progress with her obesity treatment plan along with follow-up of her obesity related diagnoses. Alexandra [name] is on the Category 3 Plan and states she is following her eating plan approximately 75% of the time. Alexandra [name] states she is walking 30 minutes 3 times per week.  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   Subjective:   1. Pre-diabetes Lab Results  Component Value Date   HGBA1C 6.0 (H) 05/07/2022   HGBA1C 6.0 (H) 10/23/2021  She started on Ozempic on 11/06/2021.   Titrated up to Ozempic 0.5 mg, denies medication SE with GLP-1 therapy. She denies mass in neck, dysphagia, dyspepsia, persistent hoarseness, abdominal pain, or N/V/Constipation. She denies 1st degree history of T2DM.   2. Vitamin D deficiency She is on weekly ergocalciferol.  She denies N/V/Muscle Weakness.  3. Pure hypercholesterolemia She is not on a statin therapy.   Assessment/Plan:   1. Pre-diabetes Refill - Semaglutide,0.25 or 0.5MG /DOS, (OZEMPIC, 0.25 OR 0.5 MG/DOSE,) 2 MG/1.5ML SOPN; Inject 0.5 mg into the skin once a week.  Dispense: 3 mL; Refill: 0  Check labs today.   - Comprehensive metabolic panel - Hemoglobin A1c - Insulin, random  2. Vitamin D deficiency Refill - Vitamin D, Ergocalciferol, (DRISDOL) 1.25 MG (50000 UNIT) CAPS capsule; Take 1 capsule (50,000 Units total) by mouth every 7 (seven) days.  Dispense: 4 capsule; Refill: 0 - VITAMIN D 25 Hydroxy (Vit-D Deficiency, Fractures)  3. Pure hypercholesterolemia Check labs today.   - Lipid panel  4. Obesity, current BMI 31.3 Refill - Semaglutide,0.25  or 0.5MG /DOS, (OZEMPIC, 0.25 OR 0.5 MG/DOSE,) 2 MG/1.5ML SOPN; Inject 0.5 mg into the skin once a week.  Dispense: 3 mL; Refill: 0  Alexandra [name] is currently in the action stage of change. As such, her goal is to continue with weight loss efforts. She has agreed to the Category 3 Plan.   Exercise goals:  as is.   Behavioral modification strategies: increasing lean protein intake, decreasing simple carbohydrates, meal planning and cooking strategies, keeping healthy foods in the home, and planning for success.  Alexandra [name] has agreed to follow-up with our clinic in 4 weeks. She was informed of the importance of frequent follow-up visits to maximize her success with intensive lifestyle modifications for her multiple health conditions.   Alexandra [name] was informed we would discuss her lab results at her next visit unless there is a critical issue that needs to be addressed sooner. Alexandra [name] agreed to keep her next visit at the agreed upon time to discuss these results.  Objective:   Blood pressure 98/65, pulse 70, temperature 97.9 F (36.6 C), height 5\' 1"  (1.549 m), weight 165 lb (74.8 kg), SpO2 96 %. Body mass index is 31.18 kg/m.  General: Cooperative, alert, well developed, in no acute distress. HEENT: Conjunctivae and lids unremarkable. Cardiovascular: Regular rhythm.  Lungs: Normal work of breathing. Neurologic: No focal deficits.   Lab Results  Component Value Date   CREATININE 0.97 05/07/2022   BUN 13 05/07/2022   NA 143 05/07/2022   K 4.7 05/07/2022   CL 104 05/07/2022   CO2 23 05/07/2022  Lab Results  Component Value Date   ALT 12 05/07/2022   AST 16 05/07/2022   ALKPHOS 82 05/07/2022   BILITOT 0.3 05/07/2022   Lab Results  Component Value Date   HGBA1C 6.0 (H) 05/07/2022   HGBA1C 6.0 (H) 10/23/2021   Lab Results  Component Value Date   INSULIN 19.9 05/07/2022   INSULIN 17.3 10/23/2021   Lab Results  Component Value Date   TSH 1.070 10/23/2021   Lab Results  Component Value  Date   CHOL 161 05/07/2022   HDL 51 05/07/2022   LDLCALC 99 05/07/2022   TRIG 53 05/07/2022   CHOLHDL 3.2 05/07/2022   Lab Results  Component Value Date   VD25OH 59.9 05/07/2022   VD25OH 36.5 10/23/2021   Lab Results  Component Value Date   WBC 6.7 04/10/2021   HGB 12.1 04/10/2021   HCT 38.1 04/10/2021   MCV 83.9 04/10/2021   PLT 302.0 04/10/2021   No results found for: "IRON", "TIBC", "FERRITIN"  Attestation Statements:   Reviewed by clinician on day of visit: allergies, medications, problem list, medical history, surgical history, family history, social history, and previous encounter notes.  I, Alexandra [name], RMA, am acting as Location manager for Mina Marble, NP.  I have reviewed the above documentation for accuracy and completeness, and I agree with the above. -  Conway Fedora d. Ervey Fallin, NP-C

## 2022-05-21 ENCOUNTER — Encounter (INDEPENDENT_AMBULATORY_CARE_PROVIDER_SITE_OTHER): Payer: Self-pay | Admitting: Adult Health

## 2022-05-28 DIAGNOSIS — F32A Depression, unspecified: Secondary | ICD-10-CM | POA: Diagnosis not present

## 2022-06-02 ENCOUNTER — Other Ambulatory Visit (INDEPENDENT_AMBULATORY_CARE_PROVIDER_SITE_OTHER): Payer: Self-pay | Admitting: Adult Health

## 2022-06-02 DIAGNOSIS — E559 Vitamin D deficiency, unspecified: Secondary | ICD-10-CM

## 2022-06-08 DIAGNOSIS — F32A Depression, unspecified: Secondary | ICD-10-CM | POA: Diagnosis not present

## 2022-06-18 ENCOUNTER — Encounter (INDEPENDENT_AMBULATORY_CARE_PROVIDER_SITE_OTHER): Payer: Self-pay | Admitting: Physician Assistant

## 2022-06-18 ENCOUNTER — Ambulatory Visit (INDEPENDENT_AMBULATORY_CARE_PROVIDER_SITE_OTHER): Payer: Federal, State, Local not specified - PPO | Admitting: Physician Assistant

## 2022-06-18 ENCOUNTER — Ambulatory Visit (INDEPENDENT_AMBULATORY_CARE_PROVIDER_SITE_OTHER): Payer: Federal, State, Local not specified - PPO | Admitting: Family Medicine

## 2022-06-18 VITALS — BP 90/57 | HR 75 | Temp 98.4°F | Ht 61.0 in | Wt 163.0 lb

## 2022-06-18 DIAGNOSIS — E669 Obesity, unspecified: Secondary | ICD-10-CM | POA: Diagnosis not present

## 2022-06-18 DIAGNOSIS — E559 Vitamin D deficiency, unspecified: Secondary | ICD-10-CM

## 2022-06-18 DIAGNOSIS — Z6830 Body mass index (BMI) 30.0-30.9, adult: Secondary | ICD-10-CM

## 2022-06-18 DIAGNOSIS — R7303 Prediabetes: Secondary | ICD-10-CM

## 2022-06-18 MED ORDER — OZEMPIC (0.25 OR 0.5 MG/DOSE) 2 MG/1.5ML ~~LOC~~ SOPN: 0.5000 mg | PEN_INJECTOR | SUBCUTANEOUS | 0 refills | Status: DC

## 2022-06-18 MED ORDER — VITAMIN D (ERGOCALCIFEROL) 1.25 MG (50000 UNIT) PO CAPS: 50000.0000 [IU] | ORAL_CAPSULE | ORAL | 0 refills | Status: DC

## 2022-06-19 DIAGNOSIS — R7303 Prediabetes: Secondary | ICD-10-CM | POA: Diagnosis not present

## 2022-06-19 DIAGNOSIS — Z0000 Encounter for general adult medical examination without abnormal findings: Secondary | ICD-10-CM | POA: Diagnosis not present

## 2022-06-24 DIAGNOSIS — F32A Depression, unspecified: Secondary | ICD-10-CM | POA: Diagnosis not present

## 2022-06-29 DIAGNOSIS — K649 Unspecified hemorrhoids: Secondary | ICD-10-CM | POA: Diagnosis not present

## 2022-06-30 NOTE — Progress Notes (Signed)
Chief Complaint:   OBESITY Alexandra [name] is here to discuss her progress with her obesity treatment plan along with follow-up of her obesity related diagnoses. Alexandra [name] is on the Category 3 Plan and states she is following her eating plan approximately 50% of the time. Alexandra [name] states she is walking 30 minutes 3 times per week.  Today's visit was #: 10 Starting weight: 192 lbs Starting date: 10/23/2021 Today's weight: 163 lbs Today's date: 06/18/2022 Total lbs lost to date: 29 lbs Total lbs lost since last in-office visit: 2  Interim History: Alexandra [name] has done well with weight loss. Breakfast is [place] yogurt, blueberries, 2 pieces bacon/sausage and Protein drink. Lunch is frozen meal or meat/vegetables. Dinner is grilled meat/vegetables.Notes some eating out. Mother recently released for hospital with long hospitalization and some increased stress with this lately.  Subjective:   1. Pre-diabetes Labs discussed during visit today. A1c at 6.0, insulin at 19.9- not at goal.  Taking ozempic 0.5 mg weekly--Denies any side effects. Concerned about insurance coverage in January. May be interested in change to low carb eating plan at the first of the year to try to decrease A1c further.  2. Vitamin D deficiency Labs discussed during visit today. Ermalee is currently taking prescription Vit D 50,000 IU once a week. Last level of 59.9--At goal. Denies any side effects.  Assessment/Plan:   1. Pre-diabetes Continue/refill Ozempic 0.5 mg SQ once weekly for 1 month with 0 refills.  Continue ozempic, healthy eating plan to decrease simple carbohydrates and exercise to promote weight loss.   -Refill Semaglutide,0.25 or 0.5MG /DOS, (OZEMPIC, 0.25 OR 0.5 MG/DOSE,) 2 MG/1.5ML SOPN; Inject 0.5 mg into the skin once a week.  Dispense: 6 mL; Refill: 0  2. Vitamin D deficiency Continue/refill Vit d 50K IU every other week for 2 month with 0 refills.   -Refill Vitamin D, Ergocalciferol, (DRISDOL) 1.25 MG (50000  UNIT) CAPS capsule; Take 1 capsule (50,000 Units total) by mouth every 7 (seven) days.  Dispense: 4 capsule; Refill: 0  3. Obesity, current BMI 30.8 Alexandra [name] is currently in the action stage of change. As such, her goal is to continue with weight loss efforts. She has agreed to the Category 3 Plan.   Exercise goals: As is.  Behavioral modification strategies: increasing lean protein intake, decreasing simple carbohydrates, and meal planning and cooking strategies.  Alexandra [name] has agreed to follow-up with our clinic in 4 weeks. She was informed of the importance of frequent follow-up visits to maximize her success with intensive lifestyle modifications for her multiple health conditions.   Objective:   Blood pressure (!) 90/57, pulse 75, temperature 98.4 F (36.9 C), height 5\' 1"  (1.549 m), weight 163 lb (73.9 kg), SpO2 97 %. Body mass index is 30.8 kg/m.  General: Cooperative, alert, well developed, in no acute distress. HEENT: Conjunctivae and lids unremarkable. Cardiovascular: Regular rhythm.  Lungs: Normal work of breathing. Neurologic: No focal deficits.   Lab Results  Component Value Date   CREATININE 0.97 05/07/2022   BUN 13 05/07/2022   NA 143 05/07/2022   K 4.7 05/07/2022   CL 104 05/07/2022   CO2 23 05/07/2022   Lab Results  Component Value Date   ALT 12 05/07/2022   AST 16 05/07/2022   ALKPHOS 82 05/07/2022   BILITOT 0.3 05/07/2022   Lab Results  Component Value Date   HGBA1C 6.0 (H) 05/07/2022   HGBA1C 6.0 (H) 10/23/2021   Lab Results  Component Value Date   INSULIN 19.9 05/07/2022  INSULIN 17.3 10/23/2021   Lab Results  Component Value Date   TSH 1.070 10/23/2021   Lab Results  Component Value Date   CHOL 161 05/07/2022   HDL 51 05/07/2022   LDLCALC 99 05/07/2022   TRIG 53 05/07/2022   CHOLHDL 3.2 05/07/2022   Lab Results  Component Value Date   VD25OH 59.9 05/07/2022   VD25OH 36.5 10/23/2021   Lab Results  Component Value Date   WBC 6.7  04/10/2021   HGB 12.1 04/10/2021   HCT 38.1 04/10/2021   MCV 83.9 04/10/2021   PLT 302.0 04/10/2021   No results found for: "IRON", "TIBC", "FERRITIN"  Attestation Statements:   Reviewed by clinician on day of visit: allergies, medications, problem list, medical history, surgical history, family history, social history, and previous encounter notes.  I, Brendell Tyus, am acting as transcriptionist for [employer], PA.  I have reviewed the above documentation for accuracy and completeness, and I agree with the above. -  Zivah Mayr,PA-C

## 2022-07-01 DIAGNOSIS — F32A Depression, unspecified: Secondary | ICD-10-CM | POA: Diagnosis not present

## 2022-07-08 DIAGNOSIS — F32A Depression, unspecified: Secondary | ICD-10-CM | POA: Diagnosis not present

## 2022-07-15 DIAGNOSIS — F32A Depression, unspecified: Secondary | ICD-10-CM | POA: Diagnosis not present

## 2022-07-16 ENCOUNTER — Encounter (INDEPENDENT_AMBULATORY_CARE_PROVIDER_SITE_OTHER): Payer: Self-pay | Admitting: Physician Assistant

## 2022-07-16 ENCOUNTER — Ambulatory Visit (INDEPENDENT_AMBULATORY_CARE_PROVIDER_SITE_OTHER): Payer: Federal, State, Local not specified - PPO | Admitting: Physician Assistant

## 2022-07-16 VITALS — BP 98/65 | HR 74 | Temp 98.0°F | Ht 61.0 in | Wt 162.0 lb

## 2022-07-16 DIAGNOSIS — E669 Obesity, unspecified: Secondary | ICD-10-CM

## 2022-07-16 DIAGNOSIS — Z6830 Body mass index (BMI) 30.0-30.9, adult: Secondary | ICD-10-CM | POA: Diagnosis not present

## 2022-07-16 DIAGNOSIS — R7303 Prediabetes: Secondary | ICD-10-CM

## 2022-07-16 DIAGNOSIS — E559 Vitamin D deficiency, unspecified: Secondary | ICD-10-CM | POA: Diagnosis not present

## 2022-07-16 MED ORDER — OZEMPIC (0.25 OR 0.5 MG/DOSE) 2 MG/1.5ML ~~LOC~~ SOPN: 0.5000 mg | PEN_INJECTOR | SUBCUTANEOUS | 0 refills | Status: DC

## 2022-07-16 MED ORDER — VITAMIN D (ERGOCALCIFEROL) 1.25 MG (50000 UNIT) PO CAPS: 50000.0000 [IU] | ORAL_CAPSULE | ORAL | 0 refills | Status: DC

## 2022-07-22 DIAGNOSIS — F32A Depression, unspecified: Secondary | ICD-10-CM | POA: Diagnosis not present

## 2022-07-29 NOTE — Progress Notes (Signed)
Chief Complaint:   OBESITY Alexandra [name] is here to discuss her progress with her obesity treatment plan along with follow-up of her obesity related diagnoses. Alexandra [name] is on the Category 3 Plan and states she is following her eating plan approximately 25% of the time. Alexandra [name] states she is walking 30 minutes 3 times per week.  Today's visit was #: 11 Starting weight: 192 lbs Starting date: 10/23/2021 Today's weight: 162 lbs Today's date: 07/16/2022 Total lbs lost to date: 30 lbs Total lbs lost since last in-office visit: 1  Interim History: Alexandra [name] had done well with weight loss over the holidays.   Appropriate hunger/appetite-but feels like bored with prescribed nutrition plan plan.   She is not skipping meals. We discussed changing to journaling using my fitness pal or journaling log to assist with journaling.  Subjective:   1. Pre-diabetes Alexandra [name]'s A1c of 6.0/insulin at 19.9-not at goal.  She is taking Ozempic 0.5 MG weekly with no side effects.  Appetite/hunger are appropriate.  2. Vitamin D deficiency Vitamin D level of 59.9 on 05/07/2022-at goal.  Taking ergocalciferol to every other week.  Denies side effects.  Assessment/Plan:   1. Pre-diabetes Continue/Refill Ozempic 0.5 mg subcu once weekly x 1 month with 0 refills. Continue healthy eating plan to decrease simple carbohydrates, increase lean proteins and exercise to promote weight loss.  -Refill Semaglutide,0.25 or 0.5MG /DOS, (OZEMPIC, 0.25 OR 0.5 MG/DOSE,) 2 MG/1.5ML SOPN; Inject 0.5 mg into the skin once a week.  Dispense: 6 mL; Refill: 0  2. Vitamin D deficiency Continue/Refill ergocalciferol every 2 weeks for 1 month with 0 refills.  Continue healthy eating plan and exercise to promote weight loss.  -Refill Vitamin D, Ergocalciferol, (DRISDOL) 1.25 MG (50000 UNIT) CAPS capsule; Take 1 capsule (50,000 Units total) by mouth every 7 (seven) days.  Dispense: 4 capsule; Refill: 0  3. Obesity, current BMI 30.7 Alexandra [name] is  currently in the action stage of change. As such, her goal is to continue with weight loss efforts. She has agreed to keeping a food journal and adhering to recommended goals of 1500 calories and 100+ grams of protein daily.   Discussed options for recipes--[email or web address].  With journaling goals as noted above  Exercise goals: As is.  Behavioral modification strategies: increasing lean protein intake, decreasing simple carbohydrates, increasing water intake, and meal planning and cooking strategies.  Alexandra [name] has agreed to follow-up with our clinic in 4 weeks. She was informed of the importance of frequent follow-up visits to maximize her success with intensive lifestyle modifications for her multiple health conditions.   Objective:   Blood pressure 98/65, pulse 74, temperature 98 F (36.7 C), height 5\' 1"  (1.549 m), weight 162 lb (73.5 kg), SpO2 97 %. Body mass index is 30.61 kg/m.  General: Cooperative, alert, well developed, in no acute distress. HEENT: Conjunctivae and lids unremarkable. Cardiovascular: Regular rhythm.  Lungs: Normal work of breathing. Neurologic: No focal deficits.   Lab Results  Component Value Date   CREATININE 0.97 05/07/2022   BUN 13 05/07/2022   NA 143 05/07/2022   K 4.7 05/07/2022   CL 104 05/07/2022   CO2 23 05/07/2022   Lab Results  Component Value Date   ALT 12 05/07/2022   AST 16 05/07/2022   ALKPHOS 82 05/07/2022   BILITOT 0.3 05/07/2022   Lab Results  Component Value Date   HGBA1C 6.0 (H) 05/07/2022   HGBA1C 6.0 (H) 10/23/2021   Lab Results  Component Value Date   INSULIN 19.9 05/07/2022  INSULIN 17.3 10/23/2021   Lab Results  Component Value Date   TSH 1.070 10/23/2021   Lab Results  Component Value Date   CHOL 161 05/07/2022   HDL 51 05/07/2022   LDLCALC 99 05/07/2022   TRIG 53 05/07/2022   CHOLHDL 3.2 05/07/2022   Lab Results  Component Value Date   VD25OH 59.9 05/07/2022   VD25OH 36.5 10/23/2021   Lab Results   Component Value Date   WBC 6.7 04/10/2021   HGB 12.1 04/10/2021   HCT 38.1 04/10/2021   MCV 83.9 04/10/2021   PLT 302.0 04/10/2021   No results found for: "IRON", "TIBC", "FERRITIN"  Attestation Statements:   Reviewed by clinician on day of visit: allergies, medications, problem list, medical history, surgical history, family history, social history, and previous encounter notes.  I, Alexandra [name], am acting as transcriptionist for [employer], PA.  I have reviewed the above documentation for accuracy and completeness, and I agree with the above. -  Alexandra Mckinstry,PA-C

## 2022-08-05 DIAGNOSIS — F32A Depression, unspecified: Secondary | ICD-10-CM | POA: Diagnosis not present

## 2022-08-05 IMAGING — MG MM DIGITAL SCREENING BILAT W/ TOMO AND CAD
8 series · 8 of 24 positions shown · non-contrast
Comparison: Previous exam(s).

CLINICAL DATA: Screening.

EXAM:
DIGITAL SCREENING BILATERAL MAMMOGRAM WITH TOMOSYNTHESIS AND CAD
TECHNIQUE: Bilateral screening digital craniocaudal and mediolateral oblique
mammograms were obtained. Bilateral screening digital breast
tomosynthesis was performed. The images were evaluated with
computer-aided detection.

[L CC synth-2D]
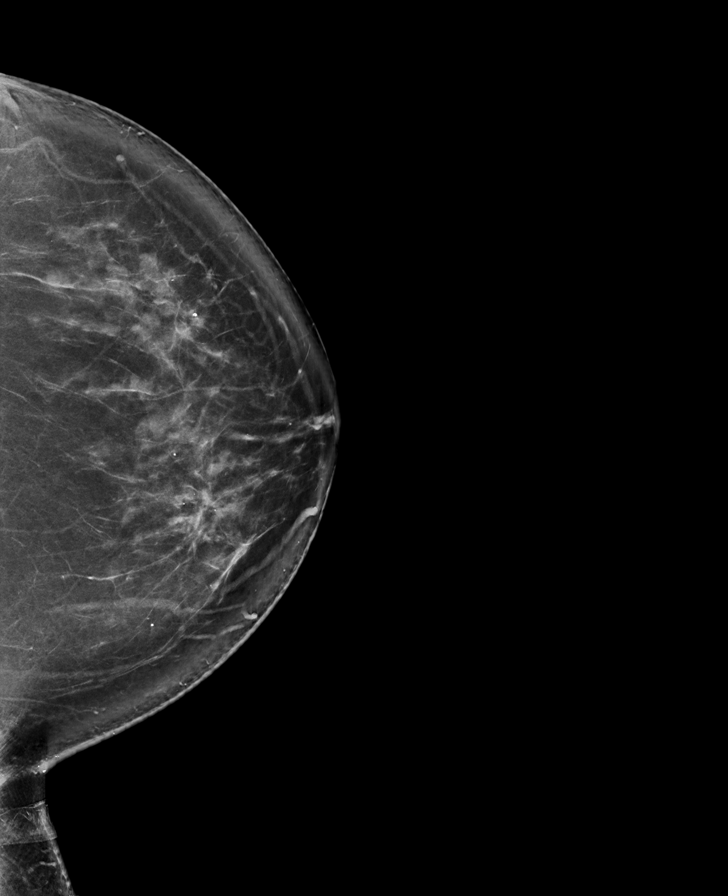

[L MLO synth-2D]
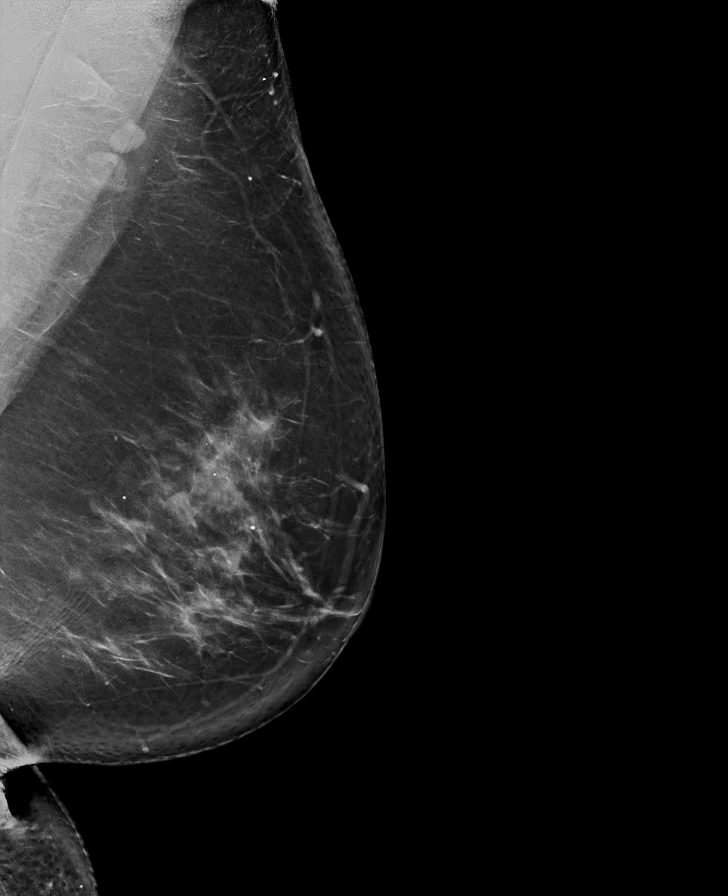

[R CC synth-2D]
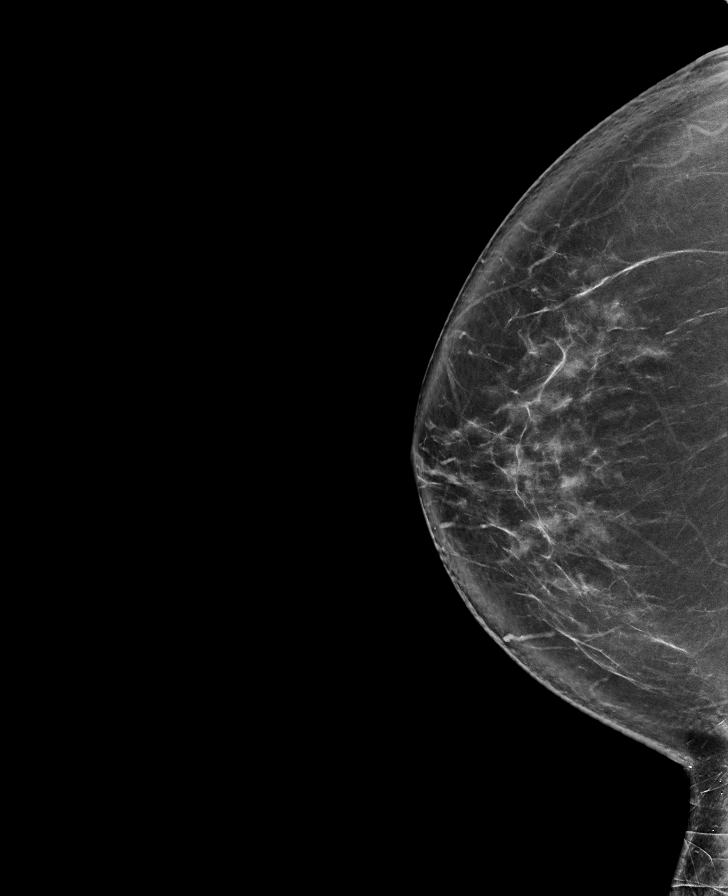

[R MLO synth-2D]
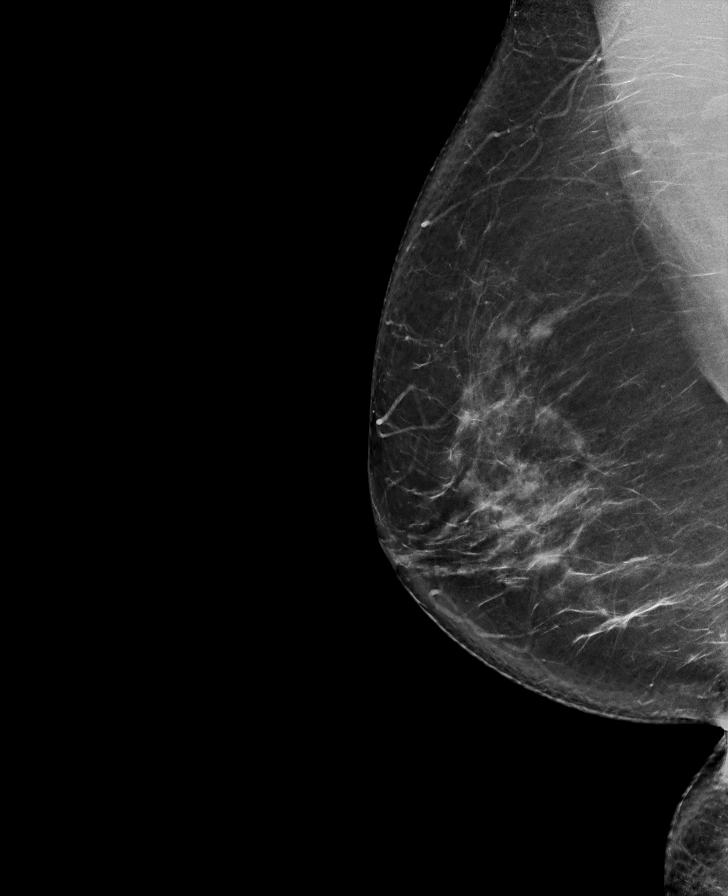

[L CC tomo · tomo slice 50/99.0]
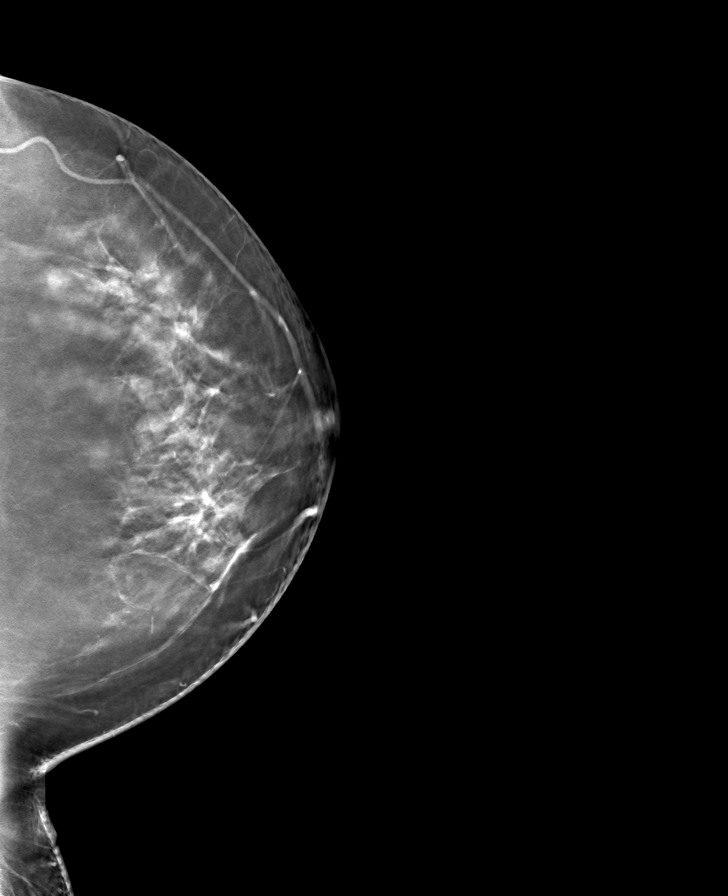

[R CC tomo · tomo slice 49/97.0]
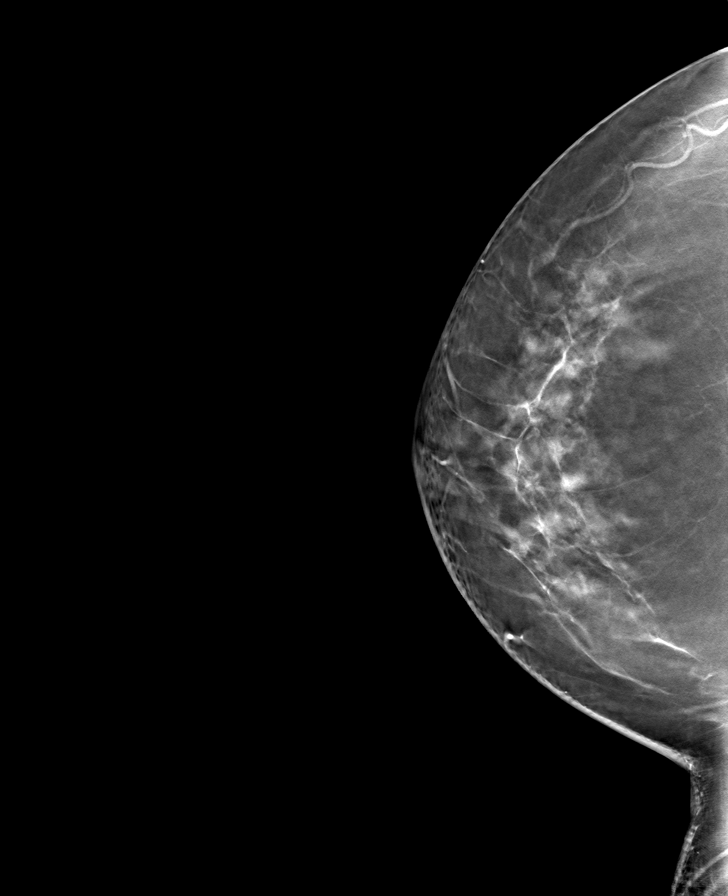

[R MLO tomo · tomo slice 49/96.0]
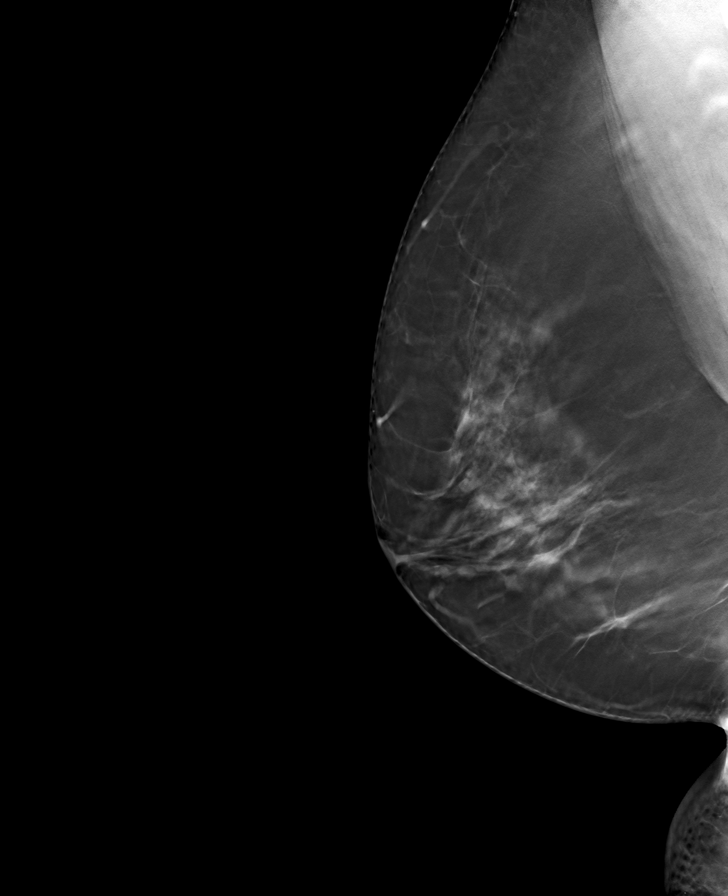

[L MLO tomo · tomo slice 51/102.0]
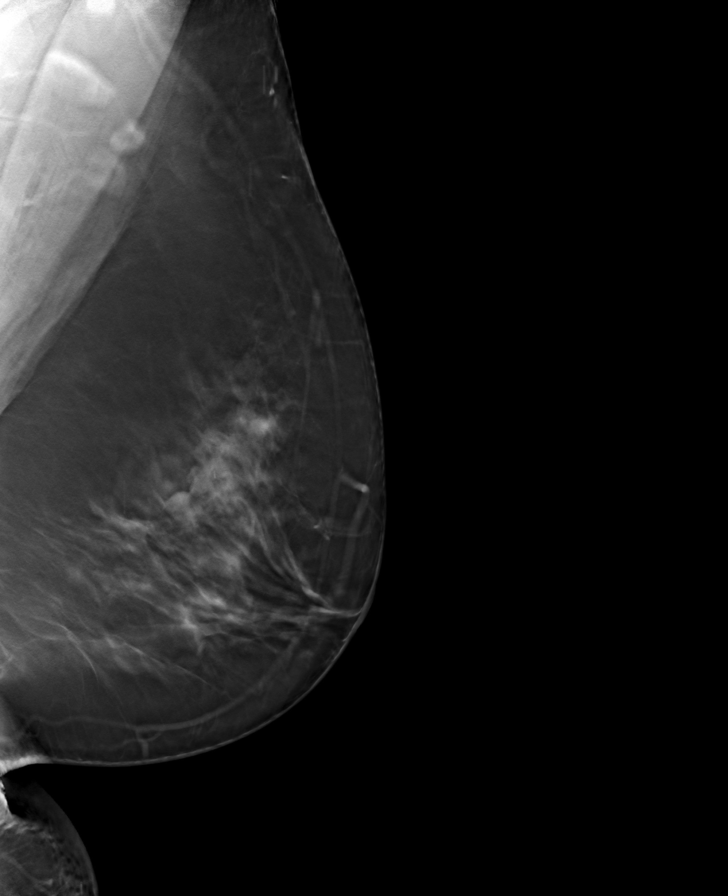

[8 of 24 positions shown; findings below may reference images not displayed]

ACR Breast Density Category c: The breast tissue is heterogeneously
dense, which may obscure small masses.
FINDINGS: There are no findings suspicious for malignancy. The images were
evaluated with computer-aided detection.
IMPRESSION: No mammographic evidence of malignancy. A result letter of this
screening mammogram will be mailed directly to the patient.

RECOMMENDATION:
Screening mammogram in one year. (Code:T4-5-GWO)

BI-RADS CATEGORY  1: Negative.

## 2022-08-06 DIAGNOSIS — M21621 Bunionette of right foot: Secondary | ICD-10-CM | POA: Diagnosis not present

## 2022-08-12 DIAGNOSIS — F32A Depression, unspecified: Secondary | ICD-10-CM | POA: Diagnosis not present

## 2022-08-13 ENCOUNTER — Other Ambulatory Visit (INDEPENDENT_AMBULATORY_CARE_PROVIDER_SITE_OTHER): Payer: Self-pay | Admitting: Physician Assistant

## 2022-08-13 ENCOUNTER — Ambulatory Visit (INDEPENDENT_AMBULATORY_CARE_PROVIDER_SITE_OTHER): Payer: Federal, State, Local not specified - PPO | Admitting: Physician Assistant

## 2022-08-13 ENCOUNTER — Telehealth (INDEPENDENT_AMBULATORY_CARE_PROVIDER_SITE_OTHER): Payer: Self-pay

## 2022-08-13 ENCOUNTER — Encounter (INDEPENDENT_AMBULATORY_CARE_PROVIDER_SITE_OTHER): Payer: Self-pay | Admitting: Physician Assistant

## 2022-08-13 VITALS — BP 105/71 | HR 70 | Temp 98.5°F | Ht 61.0 in | Wt 163.0 lb

## 2022-08-13 DIAGNOSIS — Z6830 Body mass index (BMI) 30.0-30.9, adult: Secondary | ICD-10-CM

## 2022-08-13 DIAGNOSIS — E559 Vitamin D deficiency, unspecified: Secondary | ICD-10-CM | POA: Diagnosis not present

## 2022-08-13 DIAGNOSIS — E669 Obesity, unspecified: Secondary | ICD-10-CM

## 2022-08-13 DIAGNOSIS — R7303 Prediabetes: Secondary | ICD-10-CM

## 2022-08-13 DIAGNOSIS — Z6836 Body mass index (BMI) 36.0-36.9, adult: Secondary | ICD-10-CM

## 2022-08-13 DIAGNOSIS — K649 Unspecified hemorrhoids: Secondary | ICD-10-CM | POA: Diagnosis not present

## 2022-08-13 MED ORDER — OZEMPIC (0.25 OR 0.5 MG/DOSE) 2 MG/1.5ML ~~LOC~~ SOPN: 0.5000 mg | PEN_INJECTOR | SUBCUTANEOUS | 0 refills | Status: DC

## 2022-08-13 MED ORDER — VITAMIN D (ERGOCALCIFEROL) 1.25 MG (50000 UNIT) PO CAPS: 50000.0000 [IU] | ORAL_CAPSULE | ORAL | 0 refills | Status: DC

## 2022-08-13 NOTE — Telephone Encounter (Addendum)
PA submitted for Ozempic, waiting for determination. 08/13/22  PA for Ozempic was denied, stating the following:did not meet the plan's criteria for medical necessity, the use of this medication for the prevention of diabetes is considered an experimental or investigational indication for this drug. Experimental or investigational indications are those which further studies or clinical trials are necessary to determine the maximum tolerated dose, toxicity, safety, or efficacy as compared with the standard means of treatment. For more information, please refer to the [employer] brochure (RI 71- 005 or RI 71-017). Details regarding experimental or investigational services are listed in section 10. The member's reconsideration letter is needed to move forward with the appeal process. Please have the member fax the reconsideration request to [phone] or mail it the following address: [address] [place], [place] [address] Attention: Prior Approval Clinical Services 08/17/2022

## 2022-08-25 NOTE — Progress Notes (Signed)
Chief Complaint:   OBESITY Alexandra [name] is here to discuss her progress with her obesity treatment plan along with follow-up of her obesity related diagnoses. Alexandra [name] is on keeping a food journal and adhering to recommended goals of 1500 calories and 100+ grams of protein and states she is following her eating plan approximately 75% of the time. Alexandra [name] states she is walking 30 minutes 4 times per week.  Today's visit was #: 12 Starting weight: 192 lbs Starting date: 10/23/2021 Today's weight: 163 lbs Today's date: 08/13/2022 Total lbs lost to date: 29 lbs Total lbs lost since last in-office visit: 0  Interim History: Alexandra [name] has done well with weight loss overall.  She did well to maintain her weight over the holidays. T BW loss of 14.7% since March 2023. She reports some celebration eating for her birthday in early January as well as over the holidays but again did well, maintained her weight loss.  She is working to get back on track and following her nutrition plan more closely. She is on Ozempic 0.5 mg weekly.  Reports good suppression of appetite and hunger.  Reports she is not skipping meals and has appropriate but not excessive hunger. She is exercising some, walking 4 times per week and we discussed adding some gentle strengthening exercises 3 times a week for 10 minutes.  Subjective:   1. Pre-diabetes A1c at 6.0, insulin at 19.9-not at goals on 05/07/22.  On Ozempic 0.5 mg weekly-Denies any side effects.  Working on decreasing simple carbs, increasing lean protein and exercise to promote weight loss, improve glycemic control and prevent progression to type 2 diabetes.   If little result next visit, consider increasing Ozempic to 1 mg weekly.  2. Vitamin D deficiency Level of 59.9 on 05/07/22--at goal.  Taking ergocalciferol every other week--Denies any side effects.  Assessment/Plan:   1. Pre-diabetes Continue/Refill Ozempic 0.5 mg SQ once a week for 1 month with 0 refills.   Continue Prescribed Nutrition Plan and exercise to promote weight loss, improve glycemic control and prevent progression to type 2 diabetes.  -Refill Semaglutide,0.25 or 0.5MG/DOS, (OZEMPIC, 0.25 OR 0.5 MG/DOSE,) 2 MG/1.5ML SOPN; Inject 0.5 mg into the skin once a week.  Dispense: 6 mL; Refill: 0  2. Vitamin D deficiency Continue/Refill ergocalciferol once every other week for 1 month with 0 refills.    -Refill Vitamin D, Ergocalciferol, (DRISDOL) 1.25 MG (50000 UNIT) CAPS capsule; Take 1 capsule (50,000 Units total) by mouth every 7 (seven) days.  Dispense: 4 capsule; Refill: 0  3. Obesity, current BMI 30.9 Alexandra [name] is currently in the action stage of change. As such, her goal is to continue with weight loss efforts. She has agreed to keeping a food journal and adhering to recommended goals of 1500 calories and 100+ grams of protein daily.   Exercise goals: As is.  Adding strengthening exercises 10 mins 3 times a week.  Behavioral modification strategies: increasing lean protein intake, decreasing simple carbohydrates, and planning for success.  Alexandra [name] has agreed to follow-up with our clinic in 4 weeks. She was informed of the importance of frequent follow-up visits to maximize her success with intensive lifestyle modifications for her multiple health conditions.   Objective:   Blood pressure 105/71, pulse 70, temperature 98.5 F (36.9 C), height 5' 1"$  (1.549 m), weight 163 lb (73.9 kg), SpO2 98 %. Body mass index is 30.8 kg/m.  General: Cooperative, alert, well developed, in no acute distress. HEENT: Conjunctivae and lids unremarkable. Cardiovascular: Regular rhythm.  Lungs: Normal work of breathing. Neurologic: No focal deficits.   Lab Results  Component Value Date   CREATININE 0.97 05/07/2022   BUN 13 05/07/2022   NA 143 05/07/2022   K 4.7 05/07/2022   CL 104 05/07/2022   CO2 23 05/07/2022   Lab Results  Component Value Date   ALT 12 05/07/2022   AST 16 05/07/2022    ALKPHOS 82 05/07/2022   BILITOT 0.3 05/07/2022   Lab Results  Component Value Date   HGBA1C 6.0 (H) 05/07/2022   HGBA1C 6.0 (H) 10/23/2021   Lab Results  Component Value Date   INSULIN 19.9 05/07/2022   INSULIN 17.3 10/23/2021   Lab Results  Component Value Date   TSH 1.070 10/23/2021   Lab Results  Component Value Date   CHOL 161 05/07/2022   HDL 51 05/07/2022   LDLCALC 99 05/07/2022   TRIG 53 05/07/2022   CHOLHDL 3.2 05/07/2022   Lab Results  Component Value Date   VD25OH 59.9 05/07/2022   VD25OH 36.5 10/23/2021   Lab Results  Component Value Date   WBC 6.7 04/10/2021   HGB 12.1 04/10/2021   HCT 38.1 04/10/2021   MCV 83.9 04/10/2021   PLT 302.0 04/10/2021   No results found for: "IRON", "TIBC", "FERRITIN"  Attestation Statements:   Reviewed by clinician on day of visit: allergies, medications, problem list, medical history, surgical history, family history, social history, and previous encounter notes.  I, Brendell Tyus, am acting as transcriptionist for [employer], [place].  I have reviewed the above documentation for accuracy and completeness, and I agree with the above. -  Joanny Dupree,PA-C

## 2022-08-26 DIAGNOSIS — F32A Depression, unspecified: Secondary | ICD-10-CM | POA: Diagnosis not present

## 2022-09-03 ENCOUNTER — Other Ambulatory Visit: Payer: Self-pay | Admitting: Family Medicine

## 2022-09-03 DIAGNOSIS — Z1231 Encounter for screening mammogram for malignant neoplasm of breast: Secondary | ICD-10-CM

## 2022-09-07 ENCOUNTER — Other Ambulatory Visit (INDEPENDENT_AMBULATORY_CARE_PROVIDER_SITE_OTHER): Payer: Self-pay | Admitting: Physician Assistant

## 2022-09-07 DIAGNOSIS — E559 Vitamin D deficiency, unspecified: Secondary | ICD-10-CM

## 2022-09-09 DIAGNOSIS — F32A Depression, unspecified: Secondary | ICD-10-CM | POA: Diagnosis not present

## 2022-09-10 ENCOUNTER — Ambulatory Visit (INDEPENDENT_AMBULATORY_CARE_PROVIDER_SITE_OTHER): Payer: Federal, State, Local not specified - PPO | Admitting: Physician Assistant

## 2022-09-10 ENCOUNTER — Encounter (INDEPENDENT_AMBULATORY_CARE_PROVIDER_SITE_OTHER): Payer: Self-pay

## 2022-09-17 ENCOUNTER — Ambulatory Visit (INDEPENDENT_AMBULATORY_CARE_PROVIDER_SITE_OTHER): Payer: Federal, State, Local not specified - PPO | Admitting: Internal Medicine

## 2022-09-17 ENCOUNTER — Encounter (INDEPENDENT_AMBULATORY_CARE_PROVIDER_SITE_OTHER): Payer: Self-pay | Admitting: Internal Medicine

## 2022-09-17 VITALS — BP 109/71 | HR 74 | Temp 98.3°F | Ht 61.0 in | Wt 162.0 lb

## 2022-09-17 DIAGNOSIS — R7303 Prediabetes: Secondary | ICD-10-CM | POA: Diagnosis not present

## 2022-09-17 DIAGNOSIS — Z6836 Body mass index (BMI) 36.0-36.9, adult: Secondary | ICD-10-CM | POA: Diagnosis not present

## 2022-09-17 MED ORDER — SEMAGLUTIDE-WEIGHT MANAGEMENT 0.5 MG/0.5ML ~~LOC~~ SOAJ: 0.5000 mg | SUBCUTANEOUS | 0 refills | Status: DC

## 2022-09-17 NOTE — Progress Notes (Signed)
Office: [phone]  /  Fax: [phone]  WEIGHT SUMMARY AND BIOMETRICS  Vitals Temp: 98.3 F (36.8 C) BP: 109/71 Pulse Rate: 74 SpO2: 98 %   Anthropometric Measurements Height: '5\' 1"'$  (1.549 m) Weight: 162 lb (73.5 kg) BMI (Calculated): 30.63 Weight at Last Visit: 163 lb Weight Lost Since Last Visit: 1 lb Starting Weight: 192 lb Total Weight Loss (lbs): 30 lb (13.6 kg) Peak Weight: 197 lb   Body Composition  Body Fat %: 37.9 % Fat Mass (lbs): 61.6 lbs Muscle Mass (lbs): 95.8 lbs Total Body Water (lbs): 69.2 lbs Visceral Fat Rating : 9   Other Clinical Data Fasting: No Labs: No Today's Visit #: 13 Starting Date: 07/25/21    HPI  Chief Complaint: OBESITY  Alexandra [name] is here to discuss her progress with her obesity treatment plan. She is on the the Category 1 Plan and states she is following her eating plan approximately 75 % of the time. She states she is exercising 30 minutes 4 times per week.  Interval History:  This is my first encounter with Alexandra [name].  Her peak weight is 197 pounds her current weight is 162.  She was a former patient of Dr. Owens Shark and has been seen by nurse practitioners more recently.  She is telling me that her insurance is no longer covering her Ozempic.  She had been on Ozempic for prediabetes with good results.  She has checked with her insurance and was told that they would cover Wegovy.  She has been out of the medication now for about a week and has noticed increased hunger signals as well as decreased satiety.  She is doing 3 meals a day has been making healthy choices but acknowledges may not be getting the prescribed amount of protein.  Her BIA shows a reduction in body fat but also muscle mass.  She does aerobic activity but no strengthening exercises. Barriers identified cost of medication and other: Changes in coverage .   Pharmacotherapy for weight loss: She is currently taking no anti-obesity medication and Ozempic with  pre-diabetes as the primary indication.   Weight promoting medications identified: None.  ASSESSMENT AND PLAN  TREATMENT PLAN FOR OBESITY:  Recommended Dietary Goals  Alexandra [name] is currently in the action stage of change. As such, her goal is to continue weight management plan. She has agreed to the Category 1 Plan.  Behavioral Intervention  We discussed the following Behavioral Modification Strategies today: increasing lean protein intake, increasing vegetables, avoiding skipping meals, increasing water intake, work on meal planning and easy cooking plans, and work on tracking and journaling calories using tracking App.  Additional resources provided today: NA  Recommended Physical Activity Goals  Segen has been advised to work up to 150 minutes of moderate intensity aerobic activity a week and strengthening exercises 2-3 times per week for cardiovascular health, weight loss maintenance and preservation of muscle mass.   She has agreed to : Look at ways to begin strength training.  She can start off by doing whole-body workouts 2-3 times a week watching YouTube videos and light weights at home.  Pharmacotherapy We discussed various medication options to help Alexandra [name] with her weight loss efforts and we both agreed to that in addition to reduced calorie nutrition plan (RCNP), behavioral strategies and physical activity, Alexandra [name] would benefit from pharmacotherapy to assist with hunger signals, satiety and cravings. This will reduce obesity-related health risks by inducing weight loss, and help reduce food consumption and adherence to [facility]) . It  may also improve QOL by improving self-confidence and reduce the  setbacks associated with metabolic adaptations.  After discussion of treatment options, mechanisms of action, benefits, side effects, contraindications and shared decision making she is agreeable to starting Wegovy 0.5 mg once a week. Patient also made aware that medication is indicated  for long-term management of obesity and the risk of weight regain following discontinuation of treatment and hence the importance of adhering to medical weight loss plan.  We demonstrated use of device and patient using teach back method was able to demonstrate proper technique.   ASSOCIATED CONDITIONS ADDRESSED TODAY  Class 2 severe obesity with serious comorbidity and body mass index (BMI) of 36.0 to 36.9 in adult, unspecified obesity type ([place]) -     Semaglutide-Weight Management; Inject 0.5 mg into the skin once a week for 28 days.  Dispense: 2 mL; Refill: 0  Prediabetes Assessment & Plan: Most recent A1c is  Lab Results  Component Value Date   HGBA1C 6.0 (H) 05/07/2022  Her Homa IR is 4.17 which suggest significant insulin resistance. Patient informed of disease state and risk of progression. This may contribute to abnormal cravings, fatigue, weight gain and diabetes complications without having diabetes.   We reviewed treatment options which include up on going weight loss, reducing simple and added sugars in diet, increasing fruits and vegetables and fiber containing foods.  Also increasing protein intake.  We also discussed the benefits of strengthening.  She also benefits from incretin therapy.  After discussion of benefits and side effects we will prescribe Wegovy 0.5 mg once a week.   Orders: -     Semaglutide-Weight Management; Inject 0.5 mg into the skin once a week for 28 days.  Dispense: 2 mL; Refill: 0     PHYSICAL EXAM:  Blood pressure 109/71, pulse 74, temperature 98.3 F (36.8 C), height '5\' 1"'$  (1.549 m), weight 162 lb (73.5 kg), SpO2 98 %. Body mass index is 30.61 kg/m.  General: She is overweight, cooperative, alert, well developed, and in no acute distress. PSYCH: Has normal mood, affect and thought process.   HEENT: EOMI, sclerae are anicteric. Lungs: Normal breathing effort, no conversational dyspnea. Extremities: No edema.  Neurologic: No gross sensory or  motor deficits. No tremors or fasciculations noted.    DIAGNOSTIC DATA REVIEWED:  BMET    Component Value Date/Time   NA 143 05/07/2022 1101   K 4.7 05/07/2022 1101   CL 104 05/07/2022 1101   CO2 23 05/07/2022 1101   GLUCOSE 85 05/07/2022 1101   BUN 13 05/07/2022 1101   CREATININE 0.97 05/07/2022 1101   CALCIUM 9.3 05/07/2022 1101   Lab Results  Component Value Date   HGBA1C 6.0 (H) 05/07/2022   HGBA1C 6.0 (H) 10/23/2021   Lab Results  Component Value Date   INSULIN 19.9 05/07/2022   INSULIN 17.3 10/23/2021   Lab Results  Component Value Date   TSH 1.070 10/23/2021   CBC    Component Value Date/Time   WBC 6.7 04/10/2021 1519   RBC 4.55 04/10/2021 1519   HGB 12.1 04/10/2021 1519   HCT 38.1 04/10/2021 1519   PLT 302.0 04/10/2021 1519   MCV 83.9 04/10/2021 1519   MCHC 31.8 04/10/2021 1519   RDW 14.4 04/10/2021 1519   Iron Studies No results found for: "IRON", "TIBC", "FERRITIN", "IRONPCTSAT" Lipid Panel     Component Value Date/Time   CHOL 161 05/07/2022 1101   TRIG 53 05/07/2022 1101   HDL 51 05/07/2022 1101  CHOLHDL 3.2 05/07/2022 1101   LDLCALC 99 05/07/2022 1101   Hepatic Function Panel     Component Value Date/Time   PROT 7.2 05/07/2022 1101   ALBUMIN 4.6 05/07/2022 1101   AST 16 05/07/2022 1101   ALT 12 05/07/2022 1101   ALKPHOS 82 05/07/2022 1101   BILITOT 0.3 05/07/2022 1101      Component Value Date/Time   TSH 1.070 10/23/2021 1006   Nutritional Lab Results  Component Value Date   VD25OH 59.9 05/07/2022   VD25OH 36.5 10/23/2021     Return in about 3 weeks (around 10/08/2022) for For Weight Mangement with Dr. Gerarda Fraction.Marland Kitchen She was informed of the importance of frequent follow up visits to maximize her success with intensive lifestyle modifications for her multiple health conditions.   ATTESTASTION STATEMENTS:  Reviewed by clinician on day of visit: allergies, medications, problem list, medical history, surgical history, family  history, social history, and previous encounter notes.   Thomes Dinning, MD

## 2022-09-17 NOTE — Assessment & Plan Note (Signed)
Most recent A1c is  Lab Results  Component Value Date   HGBA1C 6.0 (H) 05/07/2022  Her Homa IR is 4.17 which suggest significant insulin resistance. Patient informed of disease state and risk of progression. This may contribute to abnormal cravings, fatigue, weight gain and diabetes complications without having diabetes.   We reviewed treatment options which include up on going weight loss, reducing simple and added sugars in diet, increasing fruits and vegetables and fiber containing foods.  Also increasing protein intake.  We also discussed the benefits of strengthening.  She also benefits from incretin therapy.  After discussion of benefits and side effects we will prescribe Wegovy 0.5 mg once a week.

## 2022-09-21 DIAGNOSIS — F32A Depression, unspecified: Secondary | ICD-10-CM | POA: Diagnosis not present

## 2022-09-22 ENCOUNTER — Telehealth (INDEPENDENT_AMBULATORY_CARE_PROVIDER_SITE_OTHER): Payer: Self-pay

## 2022-09-22 NOTE — Telephone Encounter (Signed)
PA Wegovy started

## 2022-09-22 NOTE — Telephone Encounter (Signed)
PA approved for [facility]

## 2022-09-28 DIAGNOSIS — F32A Depression, unspecified: Secondary | ICD-10-CM | POA: Diagnosis not present

## 2022-10-08 ENCOUNTER — Ambulatory Visit (INDEPENDENT_AMBULATORY_CARE_PROVIDER_SITE_OTHER): Payer: Federal, State, Local not specified - PPO | Admitting: Internal Medicine

## 2022-10-08 ENCOUNTER — Encounter (INDEPENDENT_AMBULATORY_CARE_PROVIDER_SITE_OTHER): Payer: Self-pay | Admitting: Internal Medicine

## 2022-10-08 VITALS — BP 102/62 | HR 67 | Temp 97.9°F | Ht 61.0 in | Wt 164.0 lb

## 2022-10-08 DIAGNOSIS — Z6830 Body mass index (BMI) 30.0-30.9, adult: Secondary | ICD-10-CM

## 2022-10-08 DIAGNOSIS — R7303 Prediabetes: Secondary | ICD-10-CM | POA: Diagnosis not present

## 2022-10-08 DIAGNOSIS — E669 Obesity, unspecified: Secondary | ICD-10-CM | POA: Diagnosis not present

## 2022-10-08 MED ORDER — WEGOVY 0.5 MG/0.5ML ~~LOC~~ SOAJ: 0.5000 mg | SUBCUTANEOUS | 0 refills | Status: DC

## 2022-10-08 NOTE — Assessment & Plan Note (Signed)
Most recent A1c is  Lab Results  Component Value Date   HGBA1C 6.0 (H) 05/07/2022  Her Homa IR is 4.17 which suggest significant insulin resistance. Patient informed of disease state and risk of progression. This may contribute to abnormal cravings, fatigue, weight gain and diabetes complications without having diabetes.   We reviewed treatment options which include up on going weight loss, reducing simple and added sugars in diet, increasing fruits and vegetables and fiber containing foods.  Also increasing protein intake.  We also discussed the benefits of strengthening.  She also benefits from incretin therapy.  After discussion of benefits and side effects we will prescribe Wegovy 0.5 mg once a week.  

## 2022-10-08 NOTE — Progress Notes (Signed)
Office: [phone]  /  Fax: [phone]  WEIGHT SUMMARY AND BIOMETRICS  Vitals Temp: 97.9 F (36.6 C) BP: 102/62 Pulse Rate: 67 SpO2: 100 %   Anthropometric Measurements Height: 5\' 1"  (1.549 m) Weight: 164 lb (74.4 kg) BMI (Calculated): 31 Weight at Last Visit: 162 lb Weight Gained Since Last Visit: 2 lb   Body Composition  Body Fat %: 36.7 % Fat Mass (lbs): 60.2 lbs Muscle Mass (lbs): 98.8 lbs Total Body Water (lbs): 69.4 lbs Visceral Fat Rating : 9    HPI  Chief Complaint: OBESITY  Alexandra [name] is here to discuss her progress with her obesity treatment plan. She is on the the Category 1 Plan and states she is following her eating plan approximately 50 % of the time. She states she is exercising 30 minutes 4 times per week.  Interval History:  Since last office visit she has gained 2 lbs. BIA information suggests 3 pounds muscle gain and loss of body fat. She reports fair adherence to reduced calorie nutritional plan. She has been working on not skipping meals, increasing protein at every meal, and making healthier choices [x] Denies [] Reports problems with appetite and hunger signals.  [x] Denies [] Reports problems with satiety and satiation.  [] Denies [x] Reports problems with eating patterns and portion control.  [] Denies [x] Reports abnormal cravings   Barriers identified  problems obtaining medication .   Pharmacotherapy for weight loss: She is currently taking no anti-obesity medication.    ASSESSMENT AND PLAN  TREATMENT PLAN FOR OBESITY:  Recommended Dietary Goals  Alexandra [name] is currently in the action stage of change. As such, her goal is to continue weight management plan. She has agreed to: continue current plan  Behavioral Intervention  We discussed the following Behavioral Modification Strategies today: increasing lean protein intake, increasing vegetables, increasing water intake, work on meal planning and easy cooking plans, reading food labels ,  planning for success, better snacking choices, work on scanning foods using YUKA app to quickly assess food quality, and keeping healthy foods at home.  Additional resources provided today: None  Recommended Physical Activity Goals  Alexandra [name] has been advised to work up to 150 minutes of moderate intensity aerobic activity a week and strengthening exercises 2-3 times per week for cardiovascular health, weight loss maintenance and preservation of muscle mass.   She has agreed to :  Think about ways to increase physical activity  Pharmacotherapy We discussed various medication options to help Alexandra [name] with her weight loss efforts and we both agreed to :  Start Wegovy 0.5 mg once a week.  She had problems obtaining initial prescription hopefully does not encounter problems this time around.  Patient benefits from incretin therapy she has done well on GLP-1 and needs medication to assist with hunger signals and ability to adhere to a reduced calorie nutrition plan.  ASSOCIATED CONDITIONS ADDRESSED TODAY  Prediabetes Assessment & Plan: Most recent A1c is  Lab Results  Component Value Date   HGBA1C 6.0 (H) 05/07/2022  Her Homa IR is 4.17 which suggest significant insulin resistance. Patient informed of disease state and risk of progression. This may contribute to abnormal cravings, fatigue, weight gain and diabetes complications without having diabetes.   We reviewed treatment options which include up on going weight loss, reducing simple and added sugars in diet, increasing fruits and vegetables and fiber containing foods.  Also increasing protein intake.  We also discussed the benefits of strengthening.  She also benefits from incretin therapy.  After discussion of benefits and side  effects we will prescribe Wegovy 0.5 mg once a week.    Obesity with current BMI of 30.9  Other orders -     [id number]; Inject 0.5 mg into the skin once a week.  Dispense: 2 mL; Refill: 0     PHYSICAL  EXAM:  Blood pressure 102/62, pulse 67, temperature 97.9 F (36.6 C), height 5\' 1"  (1.549 m), weight 164 lb (74.4 kg), SpO2 100 %. Body mass index is 30.99 kg/m.  General: She is overweight, cooperative, alert, well developed, and in no acute distress. PSYCH: Has normal mood, affect and thought process.   HEENT: EOMI, sclerae are anicteric. Lungs: Normal breathing effort, no conversational dyspnea. Extremities: No edema.  Neurologic: No gross sensory or motor deficits. No tremors or fasciculations noted.    DIAGNOSTIC DATA REVIEWED:  BMET    Component Value Date/Time   NA 143 05/07/2022 1101   K 4.7 05/07/2022 1101   CL 104 05/07/2022 1101   CO2 23 05/07/2022 1101   GLUCOSE 85 05/07/2022 1101   BUN 13 05/07/2022 1101   CREATININE 0.97 05/07/2022 1101   CALCIUM 9.3 05/07/2022 1101   Lab Results  Component Value Date   HGBA1C 6.0 (H) 05/07/2022   HGBA1C 6.0 (H) 10/23/2021   Lab Results  Component Value Date   INSULIN 19.9 05/07/2022   INSULIN 17.3 10/23/2021   Lab Results  Component Value Date   TSH 1.070 10/23/2021   CBC    Component Value Date/Time   WBC 6.7 04/10/2021 1519   RBC 4.55 04/10/2021 1519   HGB 12.1 04/10/2021 1519   HCT 38.1 04/10/2021 1519   PLT 302.0 04/10/2021 1519   MCV 83.9 04/10/2021 1519   MCHC 31.8 04/10/2021 1519   RDW 14.4 04/10/2021 1519   Iron Studies No results found for: "IRON", "TIBC", "FERRITIN", "IRONPCTSAT" Lipid Panel     Component Value Date/Time   CHOL 161 05/07/2022 1101   TRIG 53 05/07/2022 1101   HDL 51 05/07/2022 1101   CHOLHDL 3.2 05/07/2022 1101   LDLCALC 99 05/07/2022 1101   Hepatic Function Panel     Component Value Date/Time   PROT 7.2 05/07/2022 1101   ALBUMIN 4.6 05/07/2022 1101   AST 16 05/07/2022 1101   ALT 12 05/07/2022 1101   ALKPHOS 82 05/07/2022 1101   BILITOT 0.3 05/07/2022 1101      Component Value Date/Time   TSH 1.070 10/23/2021 1006   Nutritional Lab Results  Component Value Date    VD25OH 59.9 05/07/2022   VD25OH 36.5 10/23/2021     Return in about 3 weeks (around 10/29/2022) for For Weight Mangement with Dr. Gerarda Fraction.Marland Kitchen She was informed of the importance of frequent follow up visits to maximize her success with intensive lifestyle modifications for her multiple health conditions.   ATTESTASTION STATEMENTS:  Reviewed by clinician on day of visit: allergies, medications, problem list, medical history, surgical history, family history, social history, and previous encounter notes.     Thomes Dinning, MD

## 2022-10-12 DIAGNOSIS — F32A Depression, unspecified: Secondary | ICD-10-CM | POA: Diagnosis not present

## 2022-10-13 DIAGNOSIS — M21622 Bunionette of left foot: Secondary | ICD-10-CM | POA: Diagnosis not present

## 2022-10-13 DIAGNOSIS — M21621 Bunionette of right foot: Secondary | ICD-10-CM | POA: Diagnosis not present

## 2022-10-23 ENCOUNTER — Other Ambulatory Visit: Payer: Self-pay | Admitting: Student

## 2022-10-27 DIAGNOSIS — M7750 Other enthesopathy of unspecified foot: Secondary | ICD-10-CM | POA: Diagnosis not present

## 2022-10-27 DIAGNOSIS — R600 Localized edema: Secondary | ICD-10-CM | POA: Diagnosis not present

## 2022-10-27 DIAGNOSIS — M7730 Calcaneal spur, unspecified foot: Secondary | ICD-10-CM | POA: Diagnosis not present

## 2022-10-27 DIAGNOSIS — M722 Plantar fascial fibromatosis: Secondary | ICD-10-CM | POA: Diagnosis not present

## 2022-10-27 DIAGNOSIS — M2010 Hallux valgus (acquired), unspecified foot: Secondary | ICD-10-CM | POA: Diagnosis not present

## 2022-11-04 NOTE — Progress Notes (Signed)
Synopsis: Referred for cough by Deatra James, MD  Subjective:   PATIENT ID: Alexandra [name] GENDER: female DOB: 09-01-1965, MRN: [id number]  Chief Complaint  Patient presents with   Follow-up    Cough is much improved. She has not had to start back flovent. She rarely uses albuterol.   CC: dyspnea on exertion   57yF with history of covid-19 infection 07/2020, cough/VCD followed by speech. She was vaccinated with pfizer and booster. Never had asthma as a kid. Never has ahd sinus surgery  She was not hospitalized but was out of work for [employer] dealing with it. Never got any specific treatment for it.   She has cough that is sometimes dry, sometimes productive of clearish sputum. SHe is seeing speech but they haven't done FNL. She doesn't have accompanying throat tightness but she does have associated hoarseness. Gets occasional chest tightness which is improved with albuterol. She periodically has PND but it is getting better (2-3x per week still). She does have some sinonasal congestion.  She has no reflux/heartburn symptoms.   She does think a course of prednisone imrpoved her cough when she got it.  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 She has no exposure to dusts/solvents/particulates without mask other than occasional candle-making. She has a dog. No pet bird. No hot tub. Never has lived outside of [place].  She never smoked, vaped.   Interval HPI:   CPEX with max vo2 111% predicted, adequate RER, end-exercise hyperventilation, borderline BR  She says overall she's doing fine since last visit. Cough/throat clearing maybe a bit worse since cutting back to ppi once daily in evening before dinner.   Stuffy nose with CPAP lately after decreasing humidification. ---------------------------------------------- No new imaging or PFT  Following with medical nutrition weight loss  team. Started on [facility] 3/21.  Has stopped the flovent. Has stopped the omeprazole. Doing ok from cough standpoint off those.   Has restarted flonase off and on at this point. Using for sinonasal congestion which she attributes to allergy. Occasional benadryl use as well for it.   Otherwise pertinent review of systems is negative.    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, unspecified    Seasonal allergies    Sleep apnea    Spasm      Family History  Problem Relation Age of Onset   Breast cancer Mother    Depression Mother    High Cholesterol Mother    Obesity Mother    High blood pressure Father    Sleep apnea Father    Breast cancer Maternal Aunt      Past Surgical History:  Procedure Laterality Date   CESAREAN SECTION     CHOLECYSTECTOMY      Social History   Socioeconomic History   Marital status: Married    Spouse name: Tinnie Gens   Number of children: Not on file   Years of education: Not on file   Highest education level: Not on file  Occupational History   Occupation: Nurse  Tobacco Use   Smoking status: Never   Smokeless tobacco: Never  Substance and Sexual Activity   Alcohol use: Yes   Drug use: Never   Sexual activity: Not on file  Other Topics Concern   Not on file  Social History Narrative   Not on file   Social  Determinants of Health   Financial Resource Strain: Not on file  Food Insecurity: Not on file  Transportation Needs: Not on file  Physical Activity: Not on file  Stress: Not on file  Social Connections: Not on file  Intimate Partner Violence: Not on file     Allergies  Allergen Reactions   Penicillins Swelling    Patient reports he tongue swelled.     Outpatient Medications Prior to Visit  Medication Sig Dispense Refill   albuterol (VENTOLIN HFA) 108 (90 Base) MCG/ACT inhaler SMARTSIG:1 Puff(s) By Mouth Every 4 Hours PRN     [place] Saline Nasal No-Drip GEL Place 1 spray into the nose every 8 (eight)  hours as needed. 22 mL 11   clobetasol (OLUX) 0.05 % topical foam Apply topically 2 (two) times daily. 50 g 5   Semaglutide-Weight Management 1 MG/0.5ML SOAJ Inject 1 mg into the skin once a week for 28 days. 2 mL 0   Spacer/Aero-Holding Chambers (AEROCHAMBER MV) inhaler Use as instructed 1 each 0   tacrolimus (PROTOPIC) 0.1 % ointment Apply topically at bedtime. 100 g 3   Vitamin D, Ergocalciferol, (DRISDOL) 1.25 MG (50000 UNIT) CAPS capsule Take 1 capsule (50,000 Units total) by mouth every 7 (seven) days. 4 capsule 0   fluticasone (FLONASE) 50 MCG/ACT nasal spray SHAKE LIQUID AND USE 1 SPRAY IN EACH NOSTRIL DAILY 16 g 11   No facility-administered medications prior to visit.       Objective:   Physical Exam:  General appearance: 57 y.o., female, NAD, conversant  Eyes: anicteric sclerae; PERRL, tracking appropriately HENT: NCAT; MMM Neck: Trachea midline; no lymphadenopathy, no JVD Lungs: CTAB, no crackles, no wheeze, with normal respiratory effort CV: RRR, no murmur  Abdomen: Soft, non-tender; non-distended, BS present  Extremities: No peripheral edema, warm Skin: Normal turgor and texture; no rash Psych: Appropriate affect Neuro: Alert and oriented to person and place, no focal deficit      Vitals:   11/05/22 1302  BP: 112/78  Pulse: (!) 56  Temp: 98.2 F (36.8 C)  TempSrc: Oral  SpO2: 99%  Weight: 166 lb (75.3 kg)  Height: 5\' 1"  (1.549 m)          99% on RA BMI Readings from Last 3 Encounters:  11/05/22 31.37 kg/m  11/05/22 30.80 kg/m  10/08/22 30.99 kg/m   Wt Readings from Last 3 Encounters:  11/05/22 166 lb (75.3 kg)  11/05/22 163 lb (73.9 kg)  10/08/22 164 lb (74.4 kg)     CBC    Component Value Date/Time   WBC 6.7 04/10/2021 1519   RBC 4.55 04/10/2021 1519   HGB 12.1 04/10/2021 1519   HCT 38.1 04/10/2021 1519   PLT 302.0 04/10/2021 1519   MCV 83.9 04/10/2021 1519   MCHC 31.8 04/10/2021 1519   RDW 14.4 04/10/2021 1519   LYMPHSABS  2.0 04/10/2021 1519   MONOABS 0.6 04/10/2021 1519   EOSABS 0.1 04/10/2021 1519   BASOSABS 0.1 04/10/2021 1519      Chest Imaging: CXR 01/2021 reviewed by me and unremarkable  Pulmonary Functions Testing Results:    Latest Ref Rng & Units 05/29/2021    9:44 AM 04/24/2021   12:08 PM  PFT Results  FVC-Pre L 2.17  P 2.19   FVC-Predicted Pre % 90  P 91   FVC-Post L 2.19  P 2.24   FVC-Predicted Post % 91  P 93   Pre FEV1/FVC % % 89  P 85   Post FEV1/FCV % %  87  P 83   FEV1-Pre L 1.94  P 1.85   FEV1-Predicted Pre % 101  P 97   FEV1-Post L 1.91  P 1.85   DLCO uncorrected ml/min/mmHg  19.83   DLCO UNC% %  109   DLCO corrected ml/min/mmHg  19.83   DLCO COR %Predicted %  109   DLVA Predicted %  132   TLC L  4.11   TLC % Predicted %  92   RV % Predicted %  111     P Preliminary result    05/29/21 methacholine challenge reviewed by me negative for airway hyperresponsiveness     Assessment & Plan:   # Chronic cough GERD/LPR had seemed like dominant issue. Speech had seen with some concern for component of VCD but not seen on FNL by ENT (albeit not during episode of dyspnea/throat tightness). Less likely but possible is a component of NAEB. Cough improved with bid ppi, flovent and she has remained stable without cough without these since last visit.   # DOE She actually has good exercise capacity on CPEX. Borderline low BR but CXR is unremarkable and PFTs are WNL. She thinks her DOE is improving as she is working on weight loss.   # OSA on CPAP  Plan: - Flovent refilled and available as needed if cough not responding to flonase, nonsedating antihistamine - keep up the good work with weight loss! - see you in a year or sooner if need be     RTC 8 weeks  Omar Person, MD [facility] 11/05/2022 1:22 PM

## 2022-11-05 ENCOUNTER — Encounter: Payer: Self-pay | Admitting: Student

## 2022-11-05 ENCOUNTER — Ambulatory Visit: Payer: Federal, State, Local not specified - PPO | Admitting: Student

## 2022-11-05 ENCOUNTER — Ambulatory Visit (INDEPENDENT_AMBULATORY_CARE_PROVIDER_SITE_OTHER): Payer: Federal, State, Local not specified - PPO | Admitting: Internal Medicine

## 2022-11-05 ENCOUNTER — Encounter (INDEPENDENT_AMBULATORY_CARE_PROVIDER_SITE_OTHER): Payer: Self-pay | Admitting: Internal Medicine

## 2022-11-05 VITALS — BP 112/78 | HR 56 | Temp 98.2°F | Ht 61.0 in | Wt 166.0 lb

## 2022-11-05 VITALS — BP 109/70 | HR 66 | Temp 98.7°F | Ht 61.0 in | Wt 163.0 lb

## 2022-11-05 DIAGNOSIS — E669 Obesity, unspecified: Secondary | ICD-10-CM | POA: Diagnosis not present

## 2022-11-05 DIAGNOSIS — R053 Chronic cough: Secondary | ICD-10-CM

## 2022-11-05 DIAGNOSIS — R7303 Prediabetes: Secondary | ICD-10-CM | POA: Diagnosis not present

## 2022-11-05 DIAGNOSIS — E559 Vitamin D deficiency, unspecified: Secondary | ICD-10-CM

## 2022-11-05 DIAGNOSIS — G4733 Obstructive sleep apnea (adult) (pediatric): Secondary | ICD-10-CM | POA: Diagnosis not present

## 2022-11-05 DIAGNOSIS — Z6830 Body mass index (BMI) 30.0-30.9, adult: Secondary | ICD-10-CM

## 2022-11-05 MED ORDER — FLUTICASONE PROPIONATE 50 MCG/ACT NA SUSP: NASAL | 11 refills | Status: DC

## 2022-11-05 MED ORDER — FLUTICASONE PROPIONATE HFA 110 MCG/ACT IN AERO
1.0000 | INHALATION_SPRAY | Freq: Two times a day (BID) | RESPIRATORY_TRACT | 12 refills | Status: DC
Start: 2022-11-05 — End: 2023-09-06

## 2022-11-05 MED ORDER — SEMAGLUTIDE-WEIGHT MANAGEMENT 1 MG/0.5ML ~~LOC~~ SOAJ
1.0000 mg | SUBCUTANEOUS | 0 refills | Status: DC
Start: 2022-11-05 — End: 2022-12-10

## 2022-11-05 MED ORDER — VITAMIN D (ERGOCALCIFEROL) 1.25 MG (50000 UNIT) PO CAPS: 50000.0000 [IU] | ORAL_CAPSULE | ORAL | 0 refills | Status: DC

## 2022-11-05 NOTE — Assessment & Plan Note (Signed)
Patient has lost approximately 18 percent of baseline body weight.  She is now back on Wegovy and tolerating medication well without any adverse effects.  Medication will be increased to 1 mg once a week.  She was counseled on the thermic effect of activity and food.  Also the importance of increasing protein intake to preserve muscle mass while losing weight.

## 2022-11-05 NOTE — Patient Instructions (Signed)
-   Flovent refilled and available as needed if cough not responding to flonase, nonsedating antihistamine - keep up the good work with weight loss! - see you in a year or sooner if need be

## 2022-11-05 NOTE — Assessment & Plan Note (Signed)
Her most recent vitamin D levels where supratherapeutic she has been advised to reduce vitamin D to every other week.  I think she is better served transitioning to over-the-counter supplementation.  We will check vitamin D levels today and make recommendations based on levels.

## 2022-11-05 NOTE — Assessment & Plan Note (Signed)
Most recent A1c is  Lab Results  Component Value Date   HGBA1C 6.0 (H) 05/07/2022  Her Homa IR is 4.17 which suggest significant insulin resistance. Patient informed of disease state and risk of progression. This may contribute to abnormal cravings, fatigue, weight gain and diabetes complications without having diabetes.   She will continue with nutritional and behavioral strategies.  We are increasing Wegovy to 1 mg weekly.  We are checking fasting blood glucose, insulin levels and hemoglobin A1c to follow-up on progress.

## 2022-11-05 NOTE — Progress Notes (Signed)
Office: [phone]  /  Fax: [phone]  WEIGHT SUMMARY AND BIOMETRICS  Vitals Temp: 98.2 F (36.8 C) BP: 112/78 Pulse Rate: (!) 56 SpO2: 99 % (on RA)   Anthropometric Measurements Height: 5\' 1"  (1.549 m) Weight: 166 lb (75.3 kg) BMI (Calculated): 31.38 Weight at Last Visit: 164 lb Weight Lost Since Last Visit: 1 lb Starting Weight: 192 Total Weight Loss (lbs): 31 lb (14.1 kg) Peak Weight: 197 lb   Body Composition  Body Fat %: 36.5 % Fat Mass (lbs): 56.6 lbs Muscle Mass (lbs): 98.6 lbs Total Body Water (lbs): 68.4 lbs Visceral Fat Rating : 9    No data recorded Today's Visit #: 15  Starting Date: 07/25/21   HPI  Chief Complaint: OBESITY  Alexandra [name] is here to discuss her progress with her obesity treatment plan. She is on the the Category 1 Plan and states she is following her eating plan approximately 75 % of the time. She states she is exercising 30 minutes 4 times per week.  Interval History:  Since last office visit she has lost 1 lb. She reports good adherence to reduced calorie nutritional plan. She has been working on reading food labels, not skipping meals, eating more fruits, eating more vegetables, drinking more water, and avoiding and or reducing liquid calories Reports problems with appetite and hunger signals.  Denies problems with satiety and satiation.  Denies problems with eating patterns and portion control.  Denies abnormal cravings. Denies feeling deprived or restricted.   Barriers identified: multiple competing priorities and work schedule.   Pharmacotherapy for weight loss: She is currently taking Wegovy.    ASSESSMENT AND PLAN  TREATMENT PLAN FOR OBESITY:  Recommended Dietary Goals  Alexandra [name] is currently in the action stage of change. As such, her goal is to continue weight management plan. She has agreed to: continue current plan  Behavioral Intervention  We discussed the following Behavioral Modification Strategies today:  increasing lean protein intake, decreasing simple carbohydrates , increasing vegetables, increasing lower glycemic fruits, increasing fiber rich foods, increasing water intake, reading food labels , continue to practice mindfulness when eating, planning for success, and better snacking choices.  Additional resources provided today: None  Recommended Physical Activity Goals  Adan has been advised to work up to 150 minutes of moderate intensity aerobic activity a week and strengthening exercises 2-3 times per week for cardiovascular health, weight loss maintenance and preservation of muscle mass.   She has agreed to :  Think about ways to increase physical activity  Pharmacotherapy We discussed various medication options to help Alexandra [name] with her weight loss efforts and we both agreed to : increase wegovy to 1 mg once a week.  ASSOCIATED CONDITIONS ADDRESSED TODAY  Obesity with current BMI of 30.9 Assessment & Plan: Patient has lost approximately 18 percent of baseline body weight.  She is now back on Wegovy and tolerating medication well without any adverse effects.  Medication will be increased to 1 mg once a week.  She was counseled on the thermic effect of activity and food.  Also the importance of increasing protein intake to preserve muscle mass while losing weight.  Orders: -     Semaglutide-Weight Management; Inject 1 mg into the skin once a week for 28 days.  Dispense: 2 mL; Refill: 0  Vitamin D deficiency Assessment & Plan: Her most recent vitamin D levels where supratherapeutic she has been advised to reduce vitamin D to every other week.  I think she is better served transitioning  to over-the-counter supplementation.  We will check vitamin D levels today and make recommendations based on levels.  Orders: -     Vitamin D (Ergocalciferol); Take 1 capsule (50,000 Units total) by mouth every 7 (seven) days.  Dispense: 4 capsule; Refill: 0 -     VITAMIN D 25 Hydroxy (Vit-D  Deficiency, Fractures)  Prediabetes Assessment & Plan: Most recent A1c is  Lab Results  Component Value Date   HGBA1C 6.0 (H) 05/07/2022  Her Homa IR is 4.17 which suggest significant insulin resistance. Patient informed of disease state and risk of progression. This may contribute to abnormal cravings, fatigue, weight gain and diabetes complications without having diabetes.   She will continue with nutritional and behavioral strategies.  We are increasing Wegovy to 1 mg weekly.  We are checking fasting blood glucose, insulin levels and hemoglobin A1c to follow-up on progress.  Orders: -     Glucose, fasting -     Hemoglobin A1c -     Insulin, random     PHYSICAL EXAM:  Blood pressure 109/70, pulse 66, temperature 98.7 F (37.1 C), height  (1.549 m), weight 163 lb (73.9 kg), SpO2 99 %. Body mass index is 30.8 kg/m.  General: She is overweight, cooperative, alert, well developed, and in no acute distress. PSYCH: Has normal mood, affect and thought process.   HEENT: EOMI, sclerae are anicteric. Lungs: Normal breathing effort, no conversational dyspnea. Extremities: No edema.  Neurologic: No gross sensory or motor deficits. No tremors or fasciculations noted.    DIAGNOSTIC DATA REVIEWED:  BMET    Component Value Date/Time   NA 143 05/07/2022 1101   K 4.7 05/07/2022 1101   CL 104 05/07/2022 1101   CO2 23 05/07/2022 1101   GLUCOSE 85 05/07/2022 1101   BUN 13 05/07/2022 1101   CREATININE 0.97 05/07/2022 1101   CALCIUM 9.3 05/07/2022 1101   Lab Results  Component Value Date   HGBA1C 6.0 (H) 05/07/2022   HGBA1C 6.0 (H) 10/23/2021   Lab Results  Component Value Date   INSULIN 19.9 05/07/2022   INSULIN 17.3 10/23/2021   Lab Results  Component Value Date   TSH 1.070 10/23/2021   CBC    Component Value Date/Time   WBC 6.7 04/10/2021 1519   RBC 4.55 04/10/2021 1519   HGB 12.1 04/10/2021 1519   HCT 38.1 04/10/2021 1519   PLT 302.0 04/10/2021 1519   MCV  83.9 04/10/2021 1519   MCHC 31.8 04/10/2021 1519   RDW 14.4 04/10/2021 1519   Iron Studies No results found for: "IRON", "TIBC", "FERRITIN", "IRONPCTSAT" Lipid Panel     Component Value Date/Time   CHOL 161 05/07/2022 1101   TRIG 53 05/07/2022 1101   HDL 51 05/07/2022 1101   CHOLHDL 3.2 05/07/2022 1101   LDLCALC 99 05/07/2022 1101   Hepatic Function Panel     Component Value Date/Time   PROT 7.2 05/07/2022 1101   ALBUMIN 4.6 05/07/2022 1101   AST 16 05/07/2022 1101   ALT 12 05/07/2022 1101   ALKPHOS 82 05/07/2022 1101   BILITOT 0.3 05/07/2022 1101      Component Value Date/Time   TSH 1.070 10/23/2021 1006   Nutritional Lab Results  Component Value Date   VD25OH 59.9 05/07/2022   VD25OH 36.5 10/23/2021     Return in about 4 weeks (around 12/03/2022) for For Weight Mangement with Dr. Rikki Spearing - fasting labs.. She was informed of the importance of frequent follow up visits to maximize her success  with intensive lifestyle modifications for her multiple health conditions.   ATTESTASTION STATEMENTS:  Reviewed by clinician on day of visit: allergies, medications, problem list, medical history, surgical history, family history, social history, and previous encounter notes.     Worthy Rancher, MD

## 2022-11-09 ENCOUNTER — Ambulatory Visit: Payer: Federal, State, Local not specified - PPO | Admitting: Dermatology

## 2022-11-12 DIAGNOSIS — M21621 Bunionette of right foot: Secondary | ICD-10-CM | POA: Diagnosis not present

## 2022-11-12 DIAGNOSIS — M21622 Bunionette of left foot: Secondary | ICD-10-CM | POA: Diagnosis not present

## 2022-12-06 ENCOUNTER — Other Ambulatory Visit (INDEPENDENT_AMBULATORY_CARE_PROVIDER_SITE_OTHER): Payer: Self-pay | Admitting: Internal Medicine

## 2022-12-10 ENCOUNTER — Ambulatory Visit
Admission: RE | Admit: 2022-12-10 | Discharge: 2022-12-10 | Disposition: A | Discharge status: Home / Self Care | Payer: Federal, State, Local not specified - PPO | Source: Ambulatory Visit | Attending: Family Medicine | Admitting: Family Medicine

## 2022-12-10 ENCOUNTER — Encounter (INDEPENDENT_AMBULATORY_CARE_PROVIDER_SITE_OTHER): Payer: Self-pay | Admitting: Internal Medicine

## 2022-12-10 ENCOUNTER — Ambulatory Visit (INDEPENDENT_AMBULATORY_CARE_PROVIDER_SITE_OTHER): Payer: Federal, State, Local not specified - PPO | Admitting: Internal Medicine

## 2022-12-10 VITALS — BP 105/68 | HR 71 | Temp 98.2°F | Ht 61.0 in | Wt 162.0 lb

## 2022-12-10 DIAGNOSIS — F4320 Adjustment disorder, unspecified: Secondary | ICD-10-CM | POA: Diagnosis not present

## 2022-12-10 DIAGNOSIS — Z1231 Encounter for screening mammogram for malignant neoplasm of breast: Secondary | ICD-10-CM

## 2022-12-10 DIAGNOSIS — E669 Obesity, unspecified: Secondary | ICD-10-CM

## 2022-12-10 DIAGNOSIS — Z6830 Body mass index (BMI) 30.0-30.9, adult: Secondary | ICD-10-CM | POA: Diagnosis not present

## 2022-12-10 DIAGNOSIS — G4733 Obstructive sleep apnea (adult) (pediatric): Secondary | ICD-10-CM | POA: Diagnosis not present

## 2022-12-10 DIAGNOSIS — R7303 Prediabetes: Secondary | ICD-10-CM | POA: Diagnosis not present

## 2022-12-10 MED ORDER — SEMAGLUTIDE-WEIGHT MANAGEMENT 1 MG/0.5ML ~~LOC~~ SOAJ
1.0000 mg | SUBCUTANEOUS | 0 refills | Status: DC
Start: 2022-12-10 — End: 2023-01-07

## 2022-12-10 NOTE — Assessment & Plan Note (Signed)
Patient has lost approximately 18 percent of baseline body weight.  She is now back on Wegovy and tolerating medication well without any adverse effects.  She will continue Wegovy 1 mg once a week.  She was counseled on the thermic effect of activity and food.  Also the importance of increasing protein intake to preserve muscle mass while losing weight.  We also counseled her on increasing strengthening exercises provided her with a list of dumbbell exercises.

## 2022-12-10 NOTE — Assessment & Plan Note (Signed)
On CPAP with reported good compliance. Continue PAP therapy.  Consider repeating sleep study in the future as she has lost 18% of body weight and may not need PAP therapy.

## 2022-12-10 NOTE — Assessment & Plan Note (Signed)
Most recent A1c is  Lab Results  Component Value Date   HGBA1C 6.0 (H) 05/07/2022  Her Homa IR is 4.17 which suggest significant insulin resistance. Patient informed of disease state and risk of progression. This may contribute to abnormal cravings, fatigue, weight gain and diabetes complications without having diabetes.   She will continue with nutritional and behavioral strategies.  Continue Wegovy at current dose.

## 2022-12-10 NOTE — Progress Notes (Signed)
Office: [phone]  /  Fax: [phone]  WEIGHT SUMMARY AND BIOMETRICS  Vitals Temp: 98.2 F (36.8 C) BP: 105/68 Pulse Rate: 71 SpO2: 98 %   Anthropometric Measurements Height: 5\' 1"  (1.549 m) Weight: 162 lb (73.5 kg) BMI (Calculated): 30.63 Weight at Last Visit: 163 lb Weight Lost Since Last Visit: 1 lb Weight Gained Since Last Visit: 0 Starting Weight: 192 lb Total Weight Loss (lbs): 30 lb (13.6 kg) Peak Weight: 197 lb   Body Composition  Body Fat %: 37.4 % Fat Mass (lbs): 60.8 lbs Muscle Mass (lbs): 96.6 lbs Total Body Water (lbs): 69.2 lbs Visceral Fat Rating : 9    No data recorded Today's Visit #: 16  Starting Date: 07/25/21   HPI  Chief Complaint: OBESITY  Alexandra [name] is here to discuss her progress with her obesity treatment plan. She is on the the Category 1 Plan and states she is following her eating plan approximately 50 % of the time. She states she is exercising walking 30 minutes 4 times per week.  Interval History:  Since last office visit she has [x]  lost []  maintained [] gained weight.  She is now back on Wegovy 1 mg once a week and denies any adverse effects.  Adherence to nutrition plan :  []  Excellent []  Good [x]  Fair []  Suboptimal []  Variable []  Gradual implementation [] Has not started implementation  Nutritional: She has been working on reading food labels, increasing protein intake at every meal, eating more fruits, eating more vegetables, drinking more water, avoiding and or reducing liquid calories, making healthier choices, and continues to exercise  Orexigenic Control: [x] Denies [] Reports problems with appetite and hunger signals.  [x] Denies [] Reports problems with satiety and satiation.  [x] Denies [] Reports problems with eating patterns and portion control.  [x] Denies [] Reports strong cravings for highly palatable foods [x] Denies [] Reports problems with feeling restricted or deprived    Barriers identified: none.    Pharmacotherapy for weight loss: She is currently taking Wegovy 1 mg gram once a week without adverse effects.   ASSESSMENT AND PLAN  TREATMENT PLAN FOR OBESITY:  Recommended Dietary Goals  Alexandra [name] is currently in the action stage of change. As such, her goal is to continue weight management plan. She has agreed to: continue current plan  Behavioral Intervention  We discussed the following Behavioral Modification Strategies today: increasing lean protein intake, decreasing simple carbohydrates , increasing vegetables, increasing lower glycemic fruits, increasing fiber rich foods, increasing water intake, continue to practice mindfulness when eating, and planning for success.  Additional resources provided today:  handout dumbell exercises  Recommended Physical Activity Goals  Alexandra [name] has been advised to work up to 150 minutes of moderate intensity aerobic activity a week and strengthening exercises 2-3 times per week for cardiovascular health, weight loss maintenance and preservation of muscle mass.   She has agreed to :  Start strengthening exercises with a goal of 2-3 sessions a week   Pharmacotherapy We have discussed various medication options to help Alexandra [name] with her weight loss efforts and we both agreed to : continue current anti-obesity medication regimen  ASSOCIATED CONDITIONS ADDRESSED TODAY  Prediabetes Assessment & Plan: Most recent A1c is  Lab Results  Component Value Date   HGBA1C 6.0 (H) 05/07/2022  Her Homa IR is 4.17 which suggest significant insulin resistance. Patient informed of disease state and risk of progression. This may contribute to abnormal cravings, fatigue, weight gain and diabetes complications without having diabetes.   She will continue with nutritional and behavioral strategies.  Continue Wegovy at current dose.   OSA (obstructive sleep apnea) Assessment & Plan: On CPAP with reported good compliance. Continue PAP therapy.  Consider  repeating sleep study in the future as she has lost 18% of body weight and may not need PAP therapy.   Obesity with current BMI of 30.9 Assessment & Plan: Patient has lost approximately 18 percent of baseline body weight.  She is now back on Wegovy and tolerating medication well without any adverse effects.  She will continue Wegovy 1 mg once a week.  She was counseled on the thermic effect of activity and food.  Also the importance of increasing protein intake to preserve muscle mass while losing weight.  We also counseled her on increasing strengthening exercises provided her with a list of dumbbell exercises.  Orders: -     Semaglutide-Weight Management; Inject 1 mg into the skin once a week for 28 days.  Dispense: 2 mL; Refill: 0    PHYSICAL EXAM:  Blood pressure 105/68, pulse 71, temperature 98.2 F (36.8 C), height 5\' 1"  (1.549 m), weight 162 lb (73.5 kg), SpO2 98 %. Body mass index is 30.61 kg/m.  General: She is overweight, cooperative, alert, well developed, and in no acute distress. PSYCH: Has normal mood, affect and thought process.   HEENT: EOMI, sclerae are anicteric. Lungs: Normal breathing effort, no conversational dyspnea. Extremities: No edema.  Neurologic: No gross sensory or motor deficits. No tremors or fasciculations noted.    DIAGNOSTIC DATA REVIEWED:  BMET    Component Value Date/Time   NA 143 05/07/2022 1101   K 4.7 05/07/2022 1101   CL 104 05/07/2022 1101   CO2 23 05/07/2022 1101   GLUCOSE 85 05/07/2022 1101   BUN 13 05/07/2022 1101   CREATININE 0.97 05/07/2022 1101   CALCIUM 9.3 05/07/2022 1101   EGFR 69 05/07/2022 1101   Lab Results  Component Value Date   HGBA1C 6.0 (H) 05/07/2022   HGBA1C 6.0 (H) 10/23/2021   Lab Results  Component Value Date   INSULIN 19.9 05/07/2022   INSULIN 17.3 10/23/2021   Lab Results  Component Value Date   TSH 1.070 10/23/2021   CBC    Component Value Date/Time   WBC 6.7 04/10/2021 1519   RBC 4.55  04/10/2021 1519   HGB 12.1 04/10/2021 1519   HCT 38.1 04/10/2021 1519   PLT 302.0 04/10/2021 1519   MCV 83.9 04/10/2021 1519   MCHC 31.8 04/10/2021 1519   RDW 14.4 04/10/2021 1519   Iron Studies No results found for: "IRON", "TIBC", "FERRITIN", "IRONPCTSAT" Lipid Panel     Component Value Date/Time   CHOL 161 05/07/2022 1101   TRIG 53 05/07/2022 1101   HDL 51 05/07/2022 1101   CHOLHDL 3.2 05/07/2022 1101   LDLCALC 99 05/07/2022 1101   Hepatic Function Panel     Component Value Date/Time   PROT 7.2 05/07/2022 1101   ALBUMIN 4.6 05/07/2022 1101   AST 16 05/07/2022 1101   ALT 12 05/07/2022 1101   ALKPHOS 82 05/07/2022 1101   BILITOT 0.3 05/07/2022 1101      Component Value Date/Time   TSH 1.070 10/23/2021 1006   Nutritional Lab Results  Component Value Date   VD25OH 59.9 05/07/2022   VD25OH 36.5 10/23/2021     Return in about 3 weeks (around 12/31/2022) for For Weight Mangement with Dr. Rikki Spearing.Marland Kitchen She was informed of the importance of frequent follow up visits to maximize her success with intensive lifestyle modifications for her multiple health  conditions.   ATTESTASTION STATEMENTS:  Reviewed by clinician on day of visit: allergies, medications, problem list, medical history, surgical history, family history, social history, and previous encounter notes.     Worthy Rancher, MD

## 2022-12-13 ENCOUNTER — Other Ambulatory Visit (INDEPENDENT_AMBULATORY_CARE_PROVIDER_SITE_OTHER): Payer: Self-pay | Admitting: Internal Medicine

## 2022-12-13 DIAGNOSIS — E559 Vitamin D deficiency, unspecified: Secondary | ICD-10-CM

## 2022-12-15 ENCOUNTER — Other Ambulatory Visit: Payer: Self-pay | Admitting: Family Medicine

## 2022-12-15 DIAGNOSIS — R928 Other abnormal and inconclusive findings on diagnostic imaging of breast: Secondary | ICD-10-CM

## 2022-12-17 ENCOUNTER — Ambulatory Visit: Payer: Federal, State, Local not specified - PPO

## 2022-12-17 DIAGNOSIS — F4320 Adjustment disorder, unspecified: Secondary | ICD-10-CM | POA: Diagnosis not present

## 2022-12-24 DIAGNOSIS — F4320 Adjustment disorder, unspecified: Secondary | ICD-10-CM | POA: Diagnosis not present

## 2022-12-30 ENCOUNTER — Ambulatory Visit: Payer: Federal, State, Local not specified - PPO

## 2022-12-30 ENCOUNTER — Ambulatory Visit
Admission: RE | Admit: 2022-12-30 | Discharge: 2022-12-30 | Disposition: A | Discharge status: Home / Self Care | Payer: Federal, State, Local not specified - PPO | Source: Ambulatory Visit | Attending: Family Medicine | Admitting: Family Medicine

## 2022-12-30 DIAGNOSIS — R928 Other abnormal and inconclusive findings on diagnostic imaging of breast: Secondary | ICD-10-CM

## 2023-01-07 ENCOUNTER — Ambulatory Visit (INDEPENDENT_AMBULATORY_CARE_PROVIDER_SITE_OTHER): Payer: Federal, State, Local not specified - PPO | Admitting: Adult Health

## 2023-01-07 VITALS — BP 90/58 | HR 69 | Temp 98.0°F | Ht 61.0 in | Wt 161.0 lb

## 2023-01-07 DIAGNOSIS — E669 Obesity, unspecified: Secondary | ICD-10-CM | POA: Diagnosis not present

## 2023-01-07 DIAGNOSIS — R7303 Prediabetes: Secondary | ICD-10-CM | POA: Diagnosis not present

## 2023-01-07 DIAGNOSIS — E559 Vitamin D deficiency, unspecified: Secondary | ICD-10-CM

## 2023-01-07 DIAGNOSIS — Z6830 Body mass index (BMI) 30.0-30.9, adult: Secondary | ICD-10-CM

## 2023-01-07 DIAGNOSIS — F4320 Adjustment disorder, unspecified: Secondary | ICD-10-CM | POA: Diagnosis not present

## 2023-01-07 MED ORDER — SEMAGLUTIDE-WEIGHT MANAGEMENT 1 MG/0.5ML ~~LOC~~ SOAJ
1.0000 mg | SUBCUTANEOUS | 0 refills | Status: DC
Start: 2023-01-07 — End: 2023-02-04

## 2023-01-07 MED ORDER — VITAMIN D (ERGOCALCIFEROL) 1.25 MG (50000 UNIT) PO CAPS
50000.0000 [IU] | ORAL_CAPSULE | ORAL | 0 refills | Status: DC
Start: 2023-01-07 — End: 2023-04-01

## 2023-01-07 NOTE — Progress Notes (Signed)
WEIGHT SUMMARY AND BIOMETRICS  Vitals Temp: 98 F (36.7 C) BP: (!) 90/58 Pulse Rate: 69 SpO2: 98 %   Anthropometric Measurements Height: 5\' 1"  (1.549 m) Weight: 161 lb (73 kg) BMI (Calculated): 30.44 Weight at Last Visit: 162lb Weight Lost Since Last Visit: 1lb Weight Gained Since Last Visit: 0 Starting Weight: 192lb Total Weight Loss (lbs): 31 lb (14.1 kg) Peak Weight: 197lb   Body Composition  Body Fat %: 37.2 % Fat Mass (lbs): 60 lbs Muscle Mass (lbs): 96.2 lbs Total Body Water (lbs): 67.8 lbs Visceral Fat Rating : 9   Other Clinical Data Fasting: no Labs: no Today's Visit #: 17 Starting Date: 10/23/21    Chief Complaint:   OBESITY Alexandra [name] is here to discuss her progress with her obesity treatment plan. She is on the the Category 1 Plan and states she is following her eating plan approximately 25 % of the time. She states she is exercising Walking 30 minutes 3 times per week.   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 Prediabetes Lab Results  Component Value Date   HGBA1C 6.0 (H) 05/07/2022   HGBA1C 6.0 (H) 10/23/2021   She is currently on weekly Wegovy 1mg  Denies mass in neck, dysphagia, dyspepsia, persistent hoarseness, abdominal pain, or N/V/C  Pt reports her paternal grandfather had T2D, she denies first degree family hx of diabetes.  2. Vitamin D deficiency  Latest Reference Range & Units 10/23/21 10:06 05/07/22 11:01  Vitamin D, 25-Hydroxy 30.0 - 100.0 ng/mL 36.5 59.9  She is on weekly Ergocalciferol- denies N/V/Muscle Weakness  Assessment/Plan:   1. Prediabetes Check Labs - Comprehensive metabolic panel - Hemoglobin A1c - Insulin, random  2. Vitamin D  deficiency Check Labs - Vitamin D, Ergocalciferol, (DRISDOL) 1.25 MG (50000 UNIT) CAPS capsule; Take 1 capsule (50,000 Units total) by mouth every 7 (seven) days.  Dispense: 12 capsule; Refill: 0 - VITAMIN D 25 Hydroxy (Vit-D Deficiency, Fractures)  3. Obesity with current BMI of 30.44 Refill - Semaglutide-Weight Management 1 MG/0.5ML SOAJ; Inject 1 mg into the skin once a week for 28 days.  Dispense: 2 mL; Refill: 0  Alexandra [name] is currently in the action stage of change. As such, her goal is to continue with weight loss efforts. She has agreed to the Category 1 Plan.   Exercise goals: For substantial health benefits, adults should do at least 150 minutes (2 hours and 30 minutes) a week of moderate-intensity, or 75 minutes (1 hour and 15 minutes) a week of vigorous-intensity aerobic physical activity, or an equivalent combination of moderate- and vigorous-intensity aerobic activity. Aerobic activity should be performed in episodes of at least 10 minutes, and preferably, it should be spread throughout the week.  Behavioral modification strategies: increasing lean protein intake, decreasing simple carbohydrates, increasing vegetables, increasing water intake, decreasing eating out, no skipping meals, meal planning and cooking strategies, and planning for success.  Alexandra [name] has agreed to follow-up with our clinic in 4 weeks. She was informed of the importance of frequent follow-up visits to maximize her success with intensive lifestyle modifications for her multiple health conditions.   Alexandra [name] was informed we would discuss her lab results at her next visit unless there is a critical issue that needs to  be addressed sooner. Alexandra [name] agreed to keep her next visit at the agreed upon time to discuss these results.  Objective:   Blood pressure (!) 90/58, pulse 69, temperature 98 F (36.7 C), height 5\' 1"  (1.549 m), weight 161 lb (73 kg), SpO2 98 %. Body mass index is 30.42 kg/m.  General: Cooperative,  alert, well developed, in no acute distress. HEENT: Conjunctivae and lids unremarkable. Cardiovascular: Regular rhythm.  Lungs: Normal work of breathing. Neurologic: No focal deficits.   Lab Results  Component Value Date   CREATININE 0.97 05/07/2022   BUN 13 05/07/2022   NA 143 05/07/2022   K 4.7 05/07/2022   CL 104 05/07/2022   CO2 23 05/07/2022   Lab Results  Component Value Date   ALT 12 05/07/2022   AST 16 05/07/2022   ALKPHOS 82 05/07/2022   BILITOT 0.3 05/07/2022   Lab Results  Component Value Date   HGBA1C 6.0 (H) 05/07/2022   HGBA1C 6.0 (H) 10/23/2021   Lab Results  Component Value Date   INSULIN 19.9 05/07/2022   INSULIN 17.3 10/23/2021   Lab Results  Component Value Date   TSH 1.070 10/23/2021   Lab Results  Component Value Date   CHOL 161 05/07/2022   HDL 51 05/07/2022   LDLCALC 99 05/07/2022   TRIG 53 05/07/2022   CHOLHDL 3.2 05/07/2022   Lab Results  Component Value Date   VD25OH 59.9 05/07/2022   VD25OH 36.5 10/23/2021   Lab Results  Component Value Date   WBC 6.7 04/10/2021   HGB 12.1 04/10/2021   HCT 38.1 04/10/2021   MCV 83.9 04/10/2021   PLT 302.0 04/10/2021   No results found for: "IRON", "TIBC", "FERRITIN"   Attestation Statements:   Reviewed by clinician on day of visit: allergies, medications, problem list, medical history, surgical history, family history, social history, and previous encounter notes.  I have reviewed the above documentation for accuracy and completeness, and I agree with the above. -  Ortencia Askari d. Izzabell Klasen, NP-C

## 2023-01-08 LAB — COMPREHENSIVE METABOLIC PANEL
ALT: 15 IU/L (ref 0–32)
AST: 16 IU/L (ref 0–40)
Albumin: 4.4 g/dL (ref 3.8–4.9)
Alkaline Phosphatase: 74 IU/L (ref 44–121)
BUN/Creatinine Ratio: 14 (ref 9–23)
BUN: 12 mg/dL (ref 6–24)
Bilirubin Total: 0.3 mg/dL (ref 0.0–1.2)
CO2: 22 mmol/L (ref 20–29)
Calcium: 9.4 mg/dL (ref 8.7–10.2)
Chloride: 107 mmol/L — ABNORMAL HIGH (ref 96–106)
Creatinine, Ser: 0.85 mg/dL (ref 0.57–1.00)
Globulin, Total: 2.7 g/dL (ref 1.5–4.5)
Glucose: 81 mg/dL (ref 70–99)
Potassium: 4.4 mmol/L (ref 3.5–5.2)
Sodium: 143 mmol/L (ref 134–144)
Total Protein: 7.1 g/dL (ref 6.0–8.5)
eGFR: 81 mL/min/{1.73_m2} (ref >59)

## 2023-01-08 LAB — VITAMIN D 25 HYDROXY (VIT D DEFICIENCY, FRACTURES): Vit D, 25-Hydroxy: 39.4 ng/mL (ref 30.0–100.0)

## 2023-01-08 LAB — HEMOGLOBIN A1C
Est. average glucose Bld gHb Est-mCnc: 123 mg/dL
Hgb A1c MFr Bld: 5.9 % — ABNORMAL HIGH (ref 4.8–5.6)

## 2023-01-08 LAB — INSULIN, RANDOM: INSULIN: 15.4 u[IU]/mL (ref 2.6–24.9)

## 2023-01-14 ENCOUNTER — Ambulatory Visit (INDEPENDENT_AMBULATORY_CARE_PROVIDER_SITE_OTHER): Payer: Federal, State, Local not specified - PPO | Admitting: Internal Medicine

## 2023-01-14 DIAGNOSIS — F4320 Adjustment disorder, unspecified: Secondary | ICD-10-CM | POA: Diagnosis not present

## 2023-01-20 DIAGNOSIS — F4320 Adjustment disorder, unspecified: Secondary | ICD-10-CM | POA: Diagnosis not present

## 2023-01-28 DIAGNOSIS — F4320 Adjustment disorder, unspecified: Secondary | ICD-10-CM | POA: Diagnosis not present

## 2023-02-03 DIAGNOSIS — E669 Obesity, unspecified: Secondary | ICD-10-CM | POA: Insufficient documentation

## 2023-02-04 ENCOUNTER — Ambulatory Visit (INDEPENDENT_AMBULATORY_CARE_PROVIDER_SITE_OTHER): Payer: Federal, State, Local not specified - PPO | Admitting: Physician Assistant

## 2023-02-04 ENCOUNTER — Encounter (INDEPENDENT_AMBULATORY_CARE_PROVIDER_SITE_OTHER): Payer: Self-pay | Admitting: Physician Assistant

## 2023-02-04 VITALS — BP 95/65 | HR 73 | Temp 98.4°F | Ht 61.0 in | Wt 161.0 lb

## 2023-02-04 DIAGNOSIS — M25552 Pain in left hip: Secondary | ICD-10-CM | POA: Diagnosis not present

## 2023-02-04 DIAGNOSIS — E668 Other obesity: Secondary | ICD-10-CM

## 2023-02-04 DIAGNOSIS — E7800 Pure hypercholesterolemia, unspecified: Secondary | ICD-10-CM

## 2023-02-04 DIAGNOSIS — L2084 Intrinsic (allergic) eczema: Secondary | ICD-10-CM | POA: Diagnosis not present

## 2023-02-04 DIAGNOSIS — E559 Vitamin D deficiency, unspecified: Secondary | ICD-10-CM | POA: Diagnosis not present

## 2023-02-04 DIAGNOSIS — Z6830 Body mass index (BMI) 30.0-30.9, adult: Secondary | ICD-10-CM

## 2023-02-04 DIAGNOSIS — E669 Obesity, unspecified: Secondary | ICD-10-CM

## 2023-02-04 DIAGNOSIS — R7303 Prediabetes: Secondary | ICD-10-CM | POA: Diagnosis not present

## 2023-02-04 DIAGNOSIS — L639 Alopecia areata, unspecified: Secondary | ICD-10-CM | POA: Diagnosis not present

## 2023-02-04 DIAGNOSIS — Z79899 Other long term (current) drug therapy: Secondary | ICD-10-CM | POA: Insufficient documentation

## 2023-02-04 MED ORDER — SEMAGLUTIDE-WEIGHT MANAGEMENT 1 MG/0.5ML ~~LOC~~ SOAJ
1.0000 mg | SUBCUTANEOUS | 0 refills | Status: DC
Start: 2023-02-04 — End: 2023-09-06
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
## 2023-02-08 DIAGNOSIS — R058 Other specified cough: Secondary | ICD-10-CM | POA: Diagnosis not present

## 2023-02-08 DIAGNOSIS — J069 Acute upper respiratory infection, unspecified: Secondary | ICD-10-CM | POA: Diagnosis not present

## 2023-02-18 ENCOUNTER — Other Ambulatory Visit (HOSPITAL_COMMUNITY)
Admission: RE | Admit: 2023-02-18 | Discharge: 2023-02-18 | Disposition: A | Discharge status: Home / Self Care | Payer: Federal, State, Local not specified - PPO | Source: Ambulatory Visit | Attending: Obstetrics and Gynecology | Admitting: Obstetrics and Gynecology

## 2023-02-18 ENCOUNTER — Other Ambulatory Visit: Payer: Self-pay | Admitting: Obstetrics and Gynecology

## 2023-02-18 DIAGNOSIS — F4320 Adjustment disorder, unspecified: Secondary | ICD-10-CM | POA: Diagnosis not present

## 2023-02-18 DIAGNOSIS — Z01419 Encounter for gynecological examination (general) (routine) without abnormal findings: Secondary | ICD-10-CM | POA: Diagnosis not present

## 2023-02-18 DIAGNOSIS — R87612 Low grade squamous intraepithelial lesion on cytologic smear of cervix (LGSIL): Secondary | ICD-10-CM | POA: Insufficient documentation

## 2023-02-23 LAB — CYTOLOGY - PAP
Comment: NEGATIVE
High risk HPV: NEGATIVE

## 2023-03-04 ENCOUNTER — Ambulatory Visit (INDEPENDENT_AMBULATORY_CARE_PROVIDER_SITE_OTHER): Payer: Federal, State, Local not specified - PPO | Admitting: Internal Medicine

## 2023-03-04 ENCOUNTER — Encounter (INDEPENDENT_AMBULATORY_CARE_PROVIDER_SITE_OTHER): Payer: Self-pay | Admitting: Internal Medicine

## 2023-03-04 VITALS — BP 97/65 | HR 74 | Temp 98.1°F | Ht 61.0 in | Wt 157.0 lb

## 2023-03-04 DIAGNOSIS — E669 Obesity, unspecified: Secondary | ICD-10-CM

## 2023-03-04 DIAGNOSIS — Z6829 Body mass index (BMI) 29.0-29.9, adult: Secondary | ICD-10-CM

## 2023-03-04 DIAGNOSIS — R7303 Prediabetes: Secondary | ICD-10-CM

## 2023-03-04 MED ORDER — SEMAGLUTIDE-WEIGHT MANAGEMENT 1.7 MG/0.75ML ~~LOC~~ SOAJ
1.7000 mg | SUBCUTANEOUS | 0 refills | Status: DC
Start: 2023-05-30 — End: 2023-04-01

## 2023-03-04 NOTE — Assessment & Plan Note (Signed)
Most recent A1c is  Lab Results  Component Value Date   HGBA1C 5.9 (H) 01/07/2023   HGBA1C 6.0 (H) 10/23/2021    Patient aware of disease state and risk of progression. This may contribute to abnormal cravings, fatigue and diabetic complications without having diabetes.   Her hemoglobin A1c is improving.  She is currently on semaglutide without any adverse effects we will increase medication to 1.7 mg once a week further management of obesity and pharmacoprophylaxis for prediabetes.

## 2023-03-04 NOTE — Progress Notes (Signed)
Office: [phone]  /  Fax: [phone]  WEIGHT SUMMARY AND BIOMETRICS  Vitals Temp: 98.1 F (36.7 C) BP: 97/65 Pulse Rate: 74 SpO2: 99 %   Anthropometric Measurements Height: 5\' 1"  (1.549 m) Weight: 157 lb (71.2 kg) BMI (Calculated): 29.68 Weight at Last Visit: 161 lb Weight Lost Since Last Visit: 4 lb Weight Gained Since Last Visit: 0 lb Starting Weight: 192 lb Total Weight Loss (lbs): 35 lb (15.9 kg) Peak Weight: 197 lb   Body Composition  Body Fat %: 36.8 % Fat Mass (lbs): 57.8 lbs Muscle Mass (lbs): 94.4 lbs Total Body Water (lbs): 68 lbs Visceral Fat Rating : 9    No data recorded Today's Visit #: 19  Starting Date: 10/23/21   HPI  Chief Complaint: OBESITY  Alexandra [name] is here to discuss her progress with her obesity treatment plan. She is on the the Category 1 Plan and states she is following her eating plan approximately 75 % of the time. She states she is exercising 30 minutes 4 times per week.  Interval History:  Since last office visit she has lost 4 pounds. She reports good adherence to reduced calorie nutritional plan. She has been working on not skipping meals, increasing protein intake at every meal, eating more fruits, eating more vegetables, drinking more water, and continues to exercise.  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  She is concerned about the schedule changes and stress.  Her husband is also working on losing weight so he is being very supportive.  She has noticed a change in her taste and dislike towards sweets.  Orixegenic Control: Reports problems with appetite and hunger signals.  Denies problems with satiety and satiation.  Denies problems with eating patterns and portion control.  Denies abnormal cravings. Denies feeling deprived or restricted.   Barriers identified: strong hunger signals and appetite.   Pharmacotherapy for weight loss: She is currently taking Wegovy with adequate clinical response  and without  side effects..    ASSESSMENT AND PLAN  TREATMENT PLAN FOR OBESITY:  Recommended Dietary Goals  Alexandra [name] is currently in the action stage of change. As such, her goal is to continue weight management plan. She has agreed to: continue current plan  Behavioral Intervention  We discussed the following Behavioral Modification Strategies today: increasing lean protein intake, decreasing simple carbohydrates , increasing vegetables, increasing lower glycemic fruits, increasing water intake, continue to practice mindfulness when eating, and planning for success.  Additional resources provided today:  Handout on overcoming plateaus  Recommended Physical Activity Goals  Alexandra [name] has been advised to work up to 150 minutes of moderate intensity aerobic activity a week and strengthening exercises 2-3 times per week for cardiovascular health, weight loss maintenance and preservation of muscle mass.   She has agreed to :  Think about ways to increase daily physical activity and overcoming barriers to exercise  Pharmacotherapy We discussed various medication options to help Alexandra [name] with her weight loss efforts and we both agreed to : increase Wegovy to 1.7 mg once a week  ASSOCIATED CONDITIONS ADDRESSED TODAY  Prediabetes Assessment & Plan: Most recent A1c is  Lab Results  Component Value Date   HGBA1C 5.9 (H) 01/07/2023   HGBA1C 6.0 (H) 10/23/2021    Patient aware of disease state and risk of progression. This may contribute to abnormal cravings, fatigue and diabetic complications without having diabetes.   Her hemoglobin A1c is improving.  She is currently on semaglutide without any adverse effects we will  increase medication to 1.7 mg once a week further management of obesity and pharmacoprophylaxis for prediabetes.   Orders: -     Semaglutide-Weight Management; Inject 1.7 mg into the skin once a week for 28 days.  Dispense: 3 mL; Refill: 0  Generalized obesity Start BMI 36.28 -      Semaglutide-Weight Management; Inject 1.7 mg into the skin once a week for 28 days.  Dispense: 3 mL; Refill: 0    PHYSICAL EXAM:  Blood pressure 97/65, pulse 74, temperature 98.1 F (36.7 C), height 5\' 1"  (1.549 m), weight 157 lb (71.2 kg), SpO2 99%. Body mass index is 29.66 kg/m.  General: She is overweight, cooperative, alert, well developed, and in no acute distress. PSYCH: Has normal mood, affect and thought process.   HEENT: EOMI, sclerae are anicteric. Lungs: Normal breathing effort, no conversational dyspnea. Extremities: No edema.  Neurologic: No gross sensory or motor deficits. No tremors or fasciculations noted.    DIAGNOSTIC DATA REVIEWED:  BMET    Component Value Date/Time   NA 143 01/07/2023 1115   K 4.4 01/07/2023 1115   CL 107 (H) 01/07/2023 1115   CO2 22 01/07/2023 1115   GLUCOSE 81 01/07/2023 1115   BUN 12 01/07/2023 1115   CREATININE 0.85 01/07/2023 1115   CALCIUM 9.4 01/07/2023 1115   Lab Results  Component Value Date   HGBA1C 5.9 (H) 01/07/2023   HGBA1C 6.0 (H) 10/23/2021   Lab Results  Component Value Date   INSULIN 15.4 01/07/2023   INSULIN 17.3 10/23/2021   Lab Results  Component Value Date   TSH 1.070 10/23/2021   CBC    Component Value Date/Time   WBC 6.7 04/10/2021 1519   RBC 4.55 04/10/2021 1519   HGB 12.1 04/10/2021 1519   HCT 38.1 04/10/2021 1519   PLT 302.0 04/10/2021 1519   MCV 83.9 04/10/2021 1519   MCHC 31.8 04/10/2021 1519   RDW 14.4 04/10/2021 1519   Iron Studies No results found for: "IRON", "TIBC", "FERRITIN", "IRONPCTSAT" Lipid Panel     Component Value Date/Time   CHOL 161 05/07/2022 1101   TRIG 53 05/07/2022 1101   HDL 51 05/07/2022 1101   CHOLHDL 3.2 05/07/2022 1101   LDLCALC 99 05/07/2022 1101   Hepatic Function Panel     Component Value Date/Time   PROT 7.1 01/07/2023 1115   ALBUMIN 4.4 01/07/2023 1115   AST 16 01/07/2023 1115   ALT 15 01/07/2023 1115   ALKPHOS 74 01/07/2023 1115   BILITOT 0.3  01/07/2023 1115      Component Value Date/Time   TSH 1.070 10/23/2021 1006   Nutritional Lab Results  Component Value Date   VD25OH 39.4 01/07/2023   VD25OH 59.9 05/07/2022   VD25OH 36.5 10/23/2021     Return in about 4 weeks (around 04/01/2023) for For Weight Mangement with Dr. Rikki [name] - virtual appointment... She was informed of the importance of frequent follow up visits to maximize her success with intensive lifestyle modifications for her multiple health conditions.   ATTESTASTION STATEMENTS:  Reviewed by clinician on day of visit: allergies, medications, problem list, medical history, surgical history, family history, social history, and previous encounter notes.     Worthy Rancher, MD

## 2023-03-11 ENCOUNTER — Telehealth: Payer: Self-pay

## 2023-03-11 NOTE — Telephone Encounter (Signed)
Per Cover My Meds: The authorization is valid from 02/09/2023 through 03/10/2024. A letter of explanation will also be mailed to the patient.

## 2023-03-11 NOTE — Telephone Encounter (Signed)
PA submitted through Cover My Meds for [facility]. Awaiting insurance determination. Key: [id number]

## 2023-03-16 DIAGNOSIS — F4320 Adjustment disorder, unspecified: Secondary | ICD-10-CM | POA: Diagnosis not present

## 2023-03-19 DIAGNOSIS — Z79899 Other long term (current) drug therapy: Secondary | ICD-10-CM | POA: Diagnosis not present

## 2023-03-29 ENCOUNTER — Other Ambulatory Visit (INDEPENDENT_AMBULATORY_CARE_PROVIDER_SITE_OTHER): Payer: Self-pay | Admitting: Internal Medicine

## 2023-03-29 DIAGNOSIS — E669 Obesity, unspecified: Secondary | ICD-10-CM

## 2023-03-29 DIAGNOSIS — R7303 Prediabetes: Secondary | ICD-10-CM

## 2023-04-01 ENCOUNTER — Telehealth (INDEPENDENT_AMBULATORY_CARE_PROVIDER_SITE_OTHER): Payer: Federal, State, Local not specified - PPO | Admitting: Internal Medicine

## 2023-04-01 ENCOUNTER — Encounter (INDEPENDENT_AMBULATORY_CARE_PROVIDER_SITE_OTHER): Payer: Self-pay | Admitting: Internal Medicine

## 2023-04-01 DIAGNOSIS — E559 Vitamin D deficiency, unspecified: Secondary | ICD-10-CM

## 2023-04-01 DIAGNOSIS — Z6836 Body mass index (BMI) 36.0-36.9, adult: Secondary | ICD-10-CM | POA: Diagnosis not present

## 2023-04-01 DIAGNOSIS — E669 Obesity, unspecified: Secondary | ICD-10-CM

## 2023-04-01 DIAGNOSIS — R7303 Prediabetes: Secondary | ICD-10-CM

## 2023-04-01 MED ORDER — SEMAGLUTIDE-WEIGHT MANAGEMENT 1.7 MG/0.75ML ~~LOC~~ SOAJ
1.7000 mg | SUBCUTANEOUS | 0 refills | Status: DC
Start: 2023-05-30 — End: 2023-04-29

## 2023-04-01 MED ORDER — VITAMIN D (ERGOCALCIFEROL) 1.25 MG (50000 UNIT) PO CAPS
50000.0000 [IU] | ORAL_CAPSULE | ORAL | 0 refills | Status: DC
Start: 2023-04-01 — End: 2023-06-24

## 2023-04-01 NOTE — Progress Notes (Addendum)
TeleHealth Visit:  Due to the COVID-19 pandemic, this visit was completed with telemedicine (audio/video) technology to reduce patient and provider exposure as well as to preserve personal protective equipment.   Alexandra [name] has verbally consented to this TeleHealth visit. The patient is located at home, the provider is located at the [employer] [facility] office. The participants in this visit include the listed provider and patient. The visit was conducted today via epic telemedicine platform.  Patient at work and provider in office   Chief Complaint: OBESITY Alexandra [name] is here to discuss her progress with her obesity treatment plan along with follow-up of her obesity related diagnoses. Alexandra [name] is on the Category 1 Plan and states she is following her eating plan approximately 75% of the time. Alexandra [name] states she is walking 30 minutes 4 times per week.   Starting weight: 192 Starting date: October 23, 2021  Interim History: Since last office visit Alexandra [name] reports losing 1 pound.  She is currently on Wegovy 1.7 mg once a week.  She notes adequate hunger control and satiation.  She denies side effects.  Subjective:   1. Generalized obesity Start BMI 36.28 Patient reports sending a snapshot of her biometrics via portal I will have staff retrieve these.  There are some discrepancies between reported weight and last office weight.  2. Prediabetes Asymptomatic.  3. Vitamin D deficiency On vitamin D supplementation  Assessment/Plan:   1. Generalized obesity Start BMI 36.28 Continue with reduced calorie nutrition plan.  Continue with Wegovy at current dose.  Continue to work on increasing volume of physical activity.  - Semaglutide-Weight Management 1.7 MG/0.75ML SOAJ; Inject 1.7 mg into the skin once a week for 28 days.  Dispense: 3 mL; Refill: 0  2. Prediabetes Continue incretin therapy for pharmacoprophylaxis. - Semaglutide-Weight Management 1.7 MG/0.75ML SOAJ; Inject 1.7 mg into the skin  once a week for 28 days.  Dispense: 3 mL; Refill: 0  3. Vitamin D deficiency Reduce vitamin D supplementation to every 2 weeks I recommend that she switch to over-the-counter supplement but would like to continue high-dose vitamin D supplementation.    - Vitamin D, Ergocalciferol, (DRISDOL) 1.25 MG (50000 UNIT) CAPS capsule; Take 1 capsule (50,000 Units total) by mouth every 14 (fourteen) days.  Dispense: 8 capsule; Refill: 0  Alexandra [name] is currently in the action stage of change. As such, her goal is to continue with weight loss efforts. She has agreed to the Category 1 Plan.   Exercise goals: Alexandra [name] has been instructed to work up to 150 minutes of moderate intensity aerobic activity a week and strengthening exercises 2-3 times per week for cardiovascular health, weight loss maintenance and preservation of muscle mass.    Behavioral modification strategies: increasing lean protein intake.  Alexandra [name] has agreed to follow-up with our clinic in 4 weeks. She was informed of the importance of frequent follow-up visits to maximize her success with intensive lifestyle modifications for her multiple health conditions.   Objective:   VITALS: Height 5 feet 1 inch, weight 156 pounds, BMI of 29 GENERAL: Alert and in no acute distress. CARDIOPULMONARY: No increased WOB. Speaking in clear sentences.  PSYCH: Pleasant and cooperative. Speech normal rate and rhythm. Affect is appropriate. Insight and judgement are appropriate. Attention is focused, linear, and appropriate.  NEURO: Oriented as arrived to appointment on time with no prompting.   Lab Results  Component Value Date   CREATININE 0.85 01/07/2023   BUN 12 01/07/2023   NA 143 01/07/2023   K  4.4 01/07/2023   CL 107 (H) 01/07/2023   CO2 22 01/07/2023   Lab Results  Component Value Date   ALT 15 01/07/2023   AST 16 01/07/2023   ALKPHOS 74 01/07/2023   BILITOT 0.3 01/07/2023   Lab Results  Component Value Date   HGBA1C 5.9 (H) 01/07/2023    HGBA1C 6.0 (H) 05/07/2022   HGBA1C 6.0 (H) 10/23/2021   Lab Results  Component Value Date   INSULIN 15.4 01/07/2023   INSULIN 19.9 05/07/2022   INSULIN 17.3 10/23/2021   Lab Results  Component Value Date   TSH 1.070 10/23/2021   Lab Results  Component Value Date   CHOL 161 05/07/2022   HDL 51 05/07/2022   LDLCALC 99 05/07/2022   TRIG 53 05/07/2022   CHOLHDL 3.2 05/07/2022   Lab Results  Component Value Date   VD25OH 39.4 01/07/2023   VD25OH 59.9 05/07/2022   VD25OH 36.5 10/23/2021   Lab Results  Component Value Date   WBC 6.7 04/10/2021   HGB 12.1 04/10/2021   HCT 38.1 04/10/2021   MCV 83.9 04/10/2021   PLT 302.0 04/10/2021   No results found for: "IRON", "TIBC", "FERRITIN"  Attestation Statements:   Reviewed by clinician on day of visit: allergies, medications, problem list, medical history, surgical history, family history, social history, and previous encounter notes.

## 2023-04-15 ENCOUNTER — Other Ambulatory Visit: Payer: Self-pay | Admitting: Obstetrics and Gynecology

## 2023-04-15 DIAGNOSIS — Z3202 Encounter for pregnancy test, result negative: Secondary | ICD-10-CM | POA: Diagnosis not present

## 2023-04-15 DIAGNOSIS — R896 Abnormal cytological findings in specimens from other organs, systems and tissues: Secondary | ICD-10-CM | POA: Diagnosis not present

## 2023-04-15 DIAGNOSIS — N888 Other specified noninflammatory disorders of cervix uteri: Secondary | ICD-10-CM | POA: Diagnosis not present

## 2023-04-20 LAB — SURGICAL PATHOLOGY

## 2023-04-29 ENCOUNTER — Ambulatory Visit (INDEPENDENT_AMBULATORY_CARE_PROVIDER_SITE_OTHER): Payer: Federal, State, Local not specified - PPO | Admitting: Internal Medicine

## 2023-04-29 ENCOUNTER — Encounter (INDEPENDENT_AMBULATORY_CARE_PROVIDER_SITE_OTHER): Payer: Self-pay | Admitting: Internal Medicine

## 2023-04-29 ENCOUNTER — Other Ambulatory Visit (INDEPENDENT_AMBULATORY_CARE_PROVIDER_SITE_OTHER): Payer: Self-pay | Admitting: Internal Medicine

## 2023-04-29 VITALS — BP 101/66 | HR 72 | Temp 98.4°F | Ht 61.0 in | Wt 154.0 lb

## 2023-04-29 DIAGNOSIS — R7303 Prediabetes: Secondary | ICD-10-CM

## 2023-04-29 DIAGNOSIS — E669 Obesity, unspecified: Secondary | ICD-10-CM

## 2023-04-29 DIAGNOSIS — E559 Vitamin D deficiency, unspecified: Secondary | ICD-10-CM | POA: Diagnosis not present

## 2023-04-29 DIAGNOSIS — G4733 Obstructive sleep apnea (adult) (pediatric): Secondary | ICD-10-CM

## 2023-04-29 DIAGNOSIS — Z6829 Body mass index (BMI) 29.0-29.9, adult: Secondary | ICD-10-CM

## 2023-04-29 MED ORDER — SEMAGLUTIDE-WEIGHT MANAGEMENT 1.7 MG/0.75ML ~~LOC~~ SOAJ
1.7000 mg | SUBCUTANEOUS | 0 refills | Status: DC
Start: 2023-05-30 — End: 2023-05-27

## 2023-04-29 NOTE — Progress Notes (Signed)
Office: [phone]  /  Fax: [phone]  WEIGHT SUMMARY AND BIOMETRICS  Vitals Temp: 98.4 F (36.9 C) BP: 101/66 Pulse Rate: 72 SpO2: 97 %   Anthropometric Measurements Height: 5\' 1"  (1.549 m) Weight: 154 lb (69.9 kg) BMI (Calculated): 29.11 Weight at Last Visit: 157 lb Weight Lost Since Last Visit: 4 lb Weight Gained Since Last Visit: 0 Starting Weight: 192 lb Total Weight Loss (lbs): 39 lb (17.7 kg) Peak Weight: 197 lb   Body Composition  Body Fat %: 35.8 % Fat Mass (lbs): 55.4 lbs Muscle Mass (lbs): 94.4 lbs Total Body Water (lbs): 68.4 lbs Visceral Fat Rating : 8    No data recorded Today's Visit #: 20  Starting Date: 10/23/21   HPI  Chief Complaint: OBESITY  Alexandra [name] is here to discuss her progress with her obesity treatment plan. She is on the the Category 1 Plan and states she is following her eating plan approximately 50 % of the time. She states she is exercising 30 minutes 4 times per week.  Interval History:  Since last office visit she has lost 4 pounds. She reports fair adherence to reduced calorie nutritional plan. She has been working on reading food labels, not skipping meals, increasing protein intake at every meal, drinking more water, making healthier choices, reducing portion sizes, and incorporating more whole foods  Orexigenic Control: Reports problems with appetite and hunger signals.  Denies problems with satiety and satiation.  Denies problems with eating patterns and portion control.  Denies abnormal cravings. Denies feeling deprived or restricted.   Barriers identified: strong hunger signals and impaired satiety / inhibitory control and orthopedic problems, medical conditions or chronic pain affecting mobility.   Pharmacotherapy for weight loss: She is currently taking Wegovy with adequate clinical response  and without side effects..    ASSESSMENT AND PLAN  TREATMENT PLAN FOR OBESITY:  Recommended Dietary  Goals  Alexandra [name] is currently in the action stage of change. As such, her goal is to continue weight management plan. She has agreed to: continue current plan  Behavioral Intervention  We discussed the following Behavioral Modification Strategies today: continue to work on maintaining a reduced calorie state, getting the recommended amount of protein, incorporating whole foods, making healthy choices, staying well hydrated and practicing mindfulness when eating..  Additional resources provided today: None  Recommended Physical Activity Goals  Alexandra [name] has been advised to work up to 150 minutes of moderate intensity aerobic activity a week and strengthening exercises 2-3 times per week for cardiovascular health, weight loss maintenance and preservation of muscle mass.   She has agreed to :  Try yoga due to joint pain with strengthening exercises  Pharmacotherapy We discussed various medication options to help Alexandra [name] with her weight loss efforts and we both agreed to : continue current anti-obesity medication regimen  ASSOCIATED CONDITIONS ADDRESSED TODAY  OSA (obstructive sleep apnea) Assessment & Plan: She has lost 22% of total body weight, she reports not using her PAP as much but is noticing some daytime fatigue.  She may benefit from having a repeat study to see if she still has disordered sleep breathing.  She will discuss this with her primary care team   Generalized obesity Start BMI 36.28 Assessment & Plan: Peak weight 197 Current weight 154 Total body weight loss 22% Nutrition : 1000 kcal/day 80 to 90 g of protein Pharmacotherapy: Semaglutide 1.7 mg once a week last dose increase 04/09/2023  See obesity treatment plan  Orders: -  Semaglutide-Weight Management; Inject 1.7 mg into the skin once a week for 28 days.  Dispense: 3 mL; Refill: 0  Prediabetes Assessment & Plan: Most recent hemoglobin A1c was 5.9 from June.  She is currently on semaglutide 1.7 mg once a week  without any adverse effects.  No reports of hypoglycemia.  She has lost 22% of total body weight.  She will continue with medically supervised weight management plan   Orders: -     Semaglutide-Weight Management; Inject 1.7 mg into the skin once a week for 28 days.  Dispense: 3 mL; Refill: 0  Vitamin D deficiency    PHYSICAL EXAM:  Blood pressure 101/66, pulse 72, temperature 98.4 F (36.9 C), height 5\' 1"  (1.549 m), weight 154 lb (69.9 kg), SpO2 97%. Body mass index is 29.1 kg/m.  General: She is overweight, cooperative, alert, well developed, and in no acute distress. PSYCH: Has normal mood, affect and thought process.   HEENT: EOMI, sclerae are anicteric. Lungs: Normal breathing effort, no conversational dyspnea. Extremities: No edema.  Neurologic: No gross sensory or motor deficits. No tremors or fasciculations noted.    DIAGNOSTIC DATA REVIEWED:  BMET    Component Value Date/Time   NA 143 01/07/2023 1115   K 4.4 01/07/2023 1115   CL 107 (H) 01/07/2023 1115   CO2 22 01/07/2023 1115   GLUCOSE 81 01/07/2023 1115   BUN 12 01/07/2023 1115   CREATININE 0.85 01/07/2023 1115   CALCIUM 9.4 01/07/2023 1115   Lab Results  Component Value Date   HGBA1C 5.9 (H) 01/07/2023   HGBA1C 6.0 (H) 10/23/2021   Lab Results  Component Value Date   INSULIN 15.4 01/07/2023   INSULIN 17.3 10/23/2021   Lab Results  Component Value Date   TSH 1.070 10/23/2021   CBC    Component Value Date/Time   WBC 6.7 04/10/2021 1519   RBC 4.55 04/10/2021 1519   HGB 12.1 04/10/2021 1519   HCT 38.1 04/10/2021 1519   PLT 302.0 04/10/2021 1519   MCV 83.9 04/10/2021 1519   MCHC 31.8 04/10/2021 1519   RDW 14.4 04/10/2021 1519   Iron Studies No results found for: "IRON", "TIBC", "FERRITIN", "IRONPCTSAT" Lipid Panel     Component Value Date/Time   CHOL 161 05/07/2022 1101   TRIG 53 05/07/2022 1101   HDL 51 05/07/2022 1101   CHOLHDL 3.2 05/07/2022 1101   LDLCALC 99 05/07/2022 1101    Hepatic Function Panel     Component Value Date/Time   PROT 7.1 01/07/2023 1115   ALBUMIN 4.4 01/07/2023 1115   AST 16 01/07/2023 1115   ALT 15 01/07/2023 1115   ALKPHOS 74 01/07/2023 1115   BILITOT 0.3 01/07/2023 1115      Component Value Date/Time   TSH 1.070 10/23/2021 1006   Nutritional Lab Results  Component Value Date   VD25OH 39.4 01/07/2023   VD25OH 59.9 05/07/2022   VD25OH 36.5 10/23/2021     Return for For Weight Mangement with Dr. Rikki Spearing as scheduled and in 8 weeks.Marland Kitchen She was informed of the importance of frequent follow up visits to maximize her success with intensive lifestyle modifications for her multiple health conditions.   ATTESTASTION STATEMENTS:  Reviewed by clinician on day of visit: allergies, medications, problem list, medical history, surgical history, family history, social history, and previous encounter notes.     Worthy Rancher, MD

## 2023-04-29 NOTE — Assessment & Plan Note (Addendum)
She has lost 22% of total body weight, she reports not using her PAP as much but is noticing some daytime fatigue.  She may benefit from having a repeat study to see if she still has disordered sleep breathing.  She will discuss this with her primary care team

## 2023-04-29 NOTE — Assessment & Plan Note (Signed)
Peak weight 197 Current weight 154 Total body weight loss 22% Nutrition : 1000 kcal/day 80 to 90 g of protein Pharmacotherapy: Semaglutide 1.7 mg once a week last dose increase 04/09/2023  See obesity treatment plan

## 2023-04-29 NOTE — Assessment & Plan Note (Addendum)
Most recent hemoglobin A1c was 5.9 from June.  She is currently on semaglutide 1.7 mg once a week without any adverse effects.  No reports of hypoglycemia.  She has lost 22% of total body weight.  She will continue with medically supervised weight management plan

## 2023-05-05 DIAGNOSIS — G4733 Obstructive sleep apnea (adult) (pediatric): Secondary | ICD-10-CM | POA: Diagnosis not present

## 2023-05-18 DIAGNOSIS — R202 Paresthesia of skin: Secondary | ICD-10-CM | POA: Diagnosis not present

## 2023-05-19 DIAGNOSIS — G4733 Obstructive sleep apnea (adult) (pediatric): Secondary | ICD-10-CM | POA: Diagnosis not present

## 2023-05-26 ENCOUNTER — Other Ambulatory Visit (INDEPENDENT_AMBULATORY_CARE_PROVIDER_SITE_OTHER): Payer: Self-pay | Admitting: Internal Medicine

## 2023-05-26 DIAGNOSIS — E669 Obesity, unspecified: Secondary | ICD-10-CM

## 2023-05-26 DIAGNOSIS — R7303 Prediabetes: Secondary | ICD-10-CM

## 2023-05-27 ENCOUNTER — Encounter (INDEPENDENT_AMBULATORY_CARE_PROVIDER_SITE_OTHER): Payer: Self-pay | Admitting: Internal Medicine

## 2023-05-27 ENCOUNTER — Ambulatory Visit (INDEPENDENT_AMBULATORY_CARE_PROVIDER_SITE_OTHER): Payer: Federal, State, Local not specified - PPO | Admitting: Internal Medicine

## 2023-05-27 VITALS — BP 92/59 | HR 77 | Temp 97.5°F | Ht 61.0 in | Wt 156.0 lb

## 2023-05-27 DIAGNOSIS — R7303 Prediabetes: Secondary | ICD-10-CM

## 2023-05-27 DIAGNOSIS — Z6829 Body mass index (BMI) 29.0-29.9, adult: Secondary | ICD-10-CM

## 2023-05-27 DIAGNOSIS — G4733 Obstructive sleep apnea (adult) (pediatric): Secondary | ICD-10-CM | POA: Diagnosis not present

## 2023-05-27 DIAGNOSIS — E669 Obesity, unspecified: Secondary | ICD-10-CM | POA: Diagnosis not present

## 2023-05-27 MED ORDER — SEMAGLUTIDE-WEIGHT MANAGEMENT 1.7 MG/0.75ML ~~LOC~~ SOAJ
1.7000 mg | SUBCUTANEOUS | 1 refills | Status: DC
Start: 2023-05-30 — End: 2023-06-15

## 2023-05-27 MED ORDER — SEMAGLUTIDE-WEIGHT MANAGEMENT 1.7 MG/0.75ML ~~LOC~~ SOAJ
1.7000 mg | SUBCUTANEOUS | 0 refills | Status: DC
Start: 2023-05-30 — End: 2023-05-27

## 2023-05-27 NOTE — Progress Notes (Signed)
Office: [phone]  /  Fax: [phone]  WEIGHT SUMMARY AND BIOMETRICS  Vitals Temp: (!) 97.5 F (36.4 C) BP: (!) 92/59 Pulse Rate: 77 SpO2: 99 %   Anthropometric Measurements Height: 5\' 1"  (1.549 m) Weight: 156 lb (70.8 kg) BMI (Calculated): 29.49 Weight at Last Visit: 154 lb Weight Lost Since Last Visit: 0 lb Weight Gained Since Last Visit: 2 lb Starting Weight: 192 lb Total Weight Loss (lbs): 37 lb (16.8 kg) Peak Weight: 197 lb   Body Composition  Body Fat %: 36.1 % Fat Mass (lbs): 56.6 lbs Muscle Mass (lbs): 95.2 lbs Total Body Water (lbs): 68.8 lbs Visceral Fat Rating : 9    No data recorded Today's Visit #: 21  Starting Date: 10/23/21   HPI  Chief Complaint: OBESITY  Alexandra [name] is here to discuss her progress with her obesity treatment plan. She is on the the Category 1 Plan and states she is following her eating plan approximately 50 % of the time. She states she is exercising 30 minutes 4 times per week.  Interval History:   Discussed the use of AI scribe software for clinical note transcription with the patient, who gave verbal consent to proceed.  History of Present Illness    She reports a challenging week, attributing it to a combination of personal and academic stressors. The patient acknowledges a shift in her eating habits, particularly an increase in eating out for dinner due to time constraints from schoolwork and caregiving responsibilities for her mother. She also admits to indulging in more sweets and desserts at work events.  The patient's weight loss journey has been significant, with a decrease in BMI from 38 to 29, moving her out of the obese category. However, she expresses concern about a perceived slowing in her weight loss rate over the past year, with minor fluctuations in recent months. She also expresses nervousness about the upcoming holiday season and its potential impact on her weight management.  The patient has been using  Wegovy for appetite control, which she reports as effective. She does not feel the need to increase the dosage as she does not feel excessively hungry or unsatisfied after meals. However, she does report instances of feeling 'stuffed' after meals, particularly dinner, suggesting a possible imbalance in her daily calorie distribution.  The patient also mentions a recent reevaluation of her sleep apnea, which has improved from severe to mild. She has been using a CPAP machine for management. Despite this, she reports experiencing restless sleep, which she attributes to her busy schedule and late-night academic work. She expresses a desire to improve her sleep quality, recognizing its importance in her overall health and weight management.       Orexigenic Control: Denies problems with appetite and hunger signals.  Denies problems with satiety and satiation.  Denies problems with eating patterns and portion control.  Denies abnormal cravings. Denies feeling deprived or restricted.   Pharmacotherapy for weight loss: She is currently taking Wegovy with adequate clinical response  and without side effects..    ASSESSMENT AND PLAN  TREATMENT PLAN FOR OBESITY:  Recommended Dietary Goals  [name] is currently in the action stage of change. As such, her goal is to continue weight management plan. She has agreed to: continue current plan  Behavioral Intervention  We discussed the following Behavioral Modification Strategies today: continue to work on maintaining a reduced calorie state, getting the recommended amount of protein, incorporating whole foods, making healthy choices, staying well hydrated and practicing mindfulness  when eating..  Additional resources provided today: None  Recommended Physical Activity Goals  [name] has been advised to work up to 150 minutes of moderate intensity aerobic activity a week and strengthening exercises 2-3 times per week for cardiovascular health, weight  loss maintenance and preservation of muscle mass.   She has agreed to :  Think about enjoyable ways to increase daily physical activity and overcoming barriers to exercise and Increase physical activity in their day and reduce sedentary time (increase NEAT).  Pharmacotherapy We discussed various medication options to help [name] with her weight loss efforts and we both agreed to : continue with nutritional and behavioral strategies  ASSOCIATED CONDITIONS ADDRESSED TODAY  Assessment and Plan    Weight Management  /obesity  Her BMI has decreased from 38 to 29, showing significant progress in weight management. However, the current weight loss rate has slowed, indicating a potential early plateau. We discussed the impact of caloric restriction on metabolic rate, emphasizing the importance of protein intake and physical activity. She reports challenges with meal prep due to increased responsibilities and stress, leading to more frequent dining out and less structured eating habits. We explored the benefits of distributing caloric intake evenly throughout the day and the option of using prepackaged meal services. We will continue Wegovy 1.7 mg, with a refill at [facility], and consider prepackaged meal services like [employer] or [employer]. She is advised to focus on a high-protein breakfast and balanced meals, and to monitor weight and dietary habits closely. We may consider repeating the metabolic rate test if the plateau persists.  Sleep Apnea   Her mild sleep apnea has improved from a previous severe status, with recent sleep study  showing a reduction in events from 40+ to 10 per minute. She reports restless sleep and a racing mind, potentially due to stress and late-night homework. We emphasized the importance of sleep hygiene and stress management. She will continue CPAP therapy and prioritize sleep hygiene and stress management to ensure adequate rest, supporting overall health and weight  management.  Prediabetes She is on [facility] for pharmacoprophylaxis without any adverse effects.  She will continue to work on eating balanced meals and reducing simple and added sugars in her diet.  We will check serologies in January.  Continue GLP-1 therapy  Follow-up   A follow-up is scheduled in four weeks, with the option of a virtual visit if an in-person visit is not feasible. It is important to ensure Wegovy refills over the holidays.       Generalized obesity Start BMI 36.28 -     Semaglutide-Weight Management; Inject 1.7 mg into the skin once a week.  Dispense: 3 mL; Refill: 1  Prediabetes -     Semaglutide-Weight Management; Inject 1.7 mg into the skin once a week.  Dispense: 3 mL; Refill: 1    PHYSICAL EXAM:  Blood pressure (!) 92/59, pulse 77, temperature (!) 97.5 F (36.4 C), height 5\' 1"  (1.549 m), weight 156 lb (70.8 kg), SpO2 99%. Body mass index is 29.48 kg/m.  General: She is overweight, cooperative, alert, well developed, and in no acute distress. PSYCH: Has normal mood, affect and thought process.   HEENT: EOMI, sclerae are anicteric. Lungs: Normal breathing effort, no conversational dyspnea. Extremities: No edema.  Neurologic: No gross sensory or motor deficits. No tremors or fasciculations noted.    DIAGNOSTIC DATA REVIEWED:  BMET    Component Value Date/Time   NA 143 01/07/2023 1115   K 4.4 01/07/2023 1115  CL 107 (H) 01/07/2023 1115   CO2 22 01/07/2023 1115   GLUCOSE 81 01/07/2023 1115   BUN 12 01/07/2023 1115   CREATININE 0.85 01/07/2023 1115   CALCIUM 9.4 01/07/2023 1115   Lab Results  Component Value Date   HGBA1C 5.9 (H) 01/07/2023   HGBA1C 6.0 (H) 10/23/2021   Lab Results  Component Value Date   INSULIN 15.4 01/07/2023   INSULIN 17.3 10/23/2021   Lab Results  Component Value Date   TSH 1.070 10/23/2021   CBC    Component Value Date/Time   WBC 6.7 04/10/2021 1519   RBC 4.55 04/10/2021 1519   HGB 12.1 04/10/2021 1519   HCT  38.1 04/10/2021 1519   PLT 302.0 04/10/2021 1519   MCV 83.9 04/10/2021 1519   MCHC 31.8 04/10/2021 1519   RDW 14.4 04/10/2021 1519   Iron Studies No results found for: "IRON", "TIBC", "FERRITIN", "IRONPCTSAT" Lipid Panel     Component Value Date/Time   CHOL 161 05/07/2022 1101   TRIG 53 05/07/2022 1101   HDL 51 05/07/2022 1101   CHOLHDL 3.2 05/07/2022 1101   LDLCALC 99 05/07/2022 1101   Hepatic Function Panel     Component Value Date/Time   PROT 7.1 01/07/2023 1115   ALBUMIN 4.4 01/07/2023 1115   AST 16 01/07/2023 1115   ALT 15 01/07/2023 1115   ALKPHOS 74 01/07/2023 1115   BILITOT 0.3 01/07/2023 1115      Component Value Date/Time   TSH 1.070 10/23/2021 1006   Nutritional Lab Results  Component Value Date   VD25OH 39.4 01/07/2023   VD25OH 59.9 05/07/2022   VD25OH 36.5 10/23/2021     Return in about 4 weeks (around 06/24/2023) for For Weight Mangement with Dr. Rikki Spearing.Marland Kitchen She was informed of the importance of frequent follow up visits to maximize her success with intensive lifestyle modifications for her multiple health conditions.   ATTESTASTION STATEMENTS:  Reviewed by clinician on day of visit: allergies, medications, problem list, medical history, surgical history, family history, social history, and previous encounter notes.     Worthy Rancher, MD

## 2023-06-09 DIAGNOSIS — R600 Localized edema: Secondary | ICD-10-CM | POA: Diagnosis not present

## 2023-06-09 DIAGNOSIS — M7730 Calcaneal spur, unspecified foot: Secondary | ICD-10-CM | POA: Diagnosis not present

## 2023-06-09 DIAGNOSIS — M722 Plantar fascial fibromatosis: Secondary | ICD-10-CM | POA: Diagnosis not present

## 2023-06-09 DIAGNOSIS — M2010 Hallux valgus (acquired), unspecified foot: Secondary | ICD-10-CM | POA: Diagnosis not present

## 2023-06-09 DIAGNOSIS — M7750 Other enthesopathy of unspecified foot: Secondary | ICD-10-CM | POA: Diagnosis not present

## 2023-06-15 ENCOUNTER — Ambulatory Visit (INDEPENDENT_AMBULATORY_CARE_PROVIDER_SITE_OTHER): Payer: Federal, State, Local not specified - PPO | Admitting: Internal Medicine

## 2023-06-15 ENCOUNTER — Encounter (INDEPENDENT_AMBULATORY_CARE_PROVIDER_SITE_OTHER): Payer: Self-pay | Admitting: Internal Medicine

## 2023-06-15 DIAGNOSIS — E669 Obesity, unspecified: Secondary | ICD-10-CM

## 2023-06-15 DIAGNOSIS — R7303 Prediabetes: Secondary | ICD-10-CM

## 2023-06-15 DIAGNOSIS — Z6828 Body mass index (BMI) 28.0-28.9, adult: Secondary | ICD-10-CM

## 2023-06-15 MED ORDER — SEMAGLUTIDE-WEIGHT MANAGEMENT 1.7 MG/0.75ML ~~LOC~~ SOAJ: 1.7000 mg | SUBCUTANEOUS | 1 refills | Status: DC

## 2023-06-15 NOTE — Progress Notes (Signed)
Office: [phone]  /  Fax: [phone]  Weight Summary And Biometrics  Vitals Temp: 98 F (36.7 C) BP: 96/62 Pulse Rate: 72 SpO2: 99 %   Anthropometric Measurements Height: 5\' 1"  (1.549 m) Weight: 152 lb (68.9 kg) BMI (Calculated): 28.74 Weight at Last Visit: 156 lb Weight Lost Since Last Visit: 4 lb Weight Gained Since Last Visit: 0 lb Starting Weight: 12 lb Total Weight Loss (lbs): 41 lb (18.6 kg) Peak Weight: 197 lb   Body Composition  Body Fat %: 35.4 % Fat Mass (lbs): 54 lbs Muscle Mass (lbs): 93.8 lbs Total Body Water (lbs): 67.2 lbs Visceral Fat Rating : 8    No data recorded No data recorded No data recorded  Subjective   Chief Complaint: Obesity  Alexandra [name] is here to discuss her progress with her obesity treatment plan. She is on the the Category 1 Plan and states she is following her eating plan approximately 75 % of the time. She states she is exercising 30 minutes 4 times per week.  Interval History:   Discussed the use of AI scribe software for clinical note transcription with the patient, who gave verbal consent to proceed.  History of Present Illness   Patient presents for a follow-up consultation regarding weight management. She has been on a weight loss regimen that includes the medication Wegovy, which she started in June of the previous year. Since then, she has experienced a significant weight loss, going from 197 pounds to 152 pounds. However, she recently received a letter from her insurance provider, [facility], stating that the coverage for Reginal Lutes will be discontinued in January. The patient has since switched her insurance plan from basic to standard within the same provider, but it is unclear if this will affect the coverage for her medication.  The patient expresses a desire to continue losing weight, with a goal of reaching the 130s. She is concerned about the potential effects of discontinuing Wegovy, particularly the  possibility of increased hunger and difficulty maintaining her current weight. She is considering out-of-pocket options for weight management medication, specifically Zepbound, but is concerned about the cost. She is also considering other treatment options such as Qsymia, a combination of phentermine and topiramate, which is less effective than Wegovy but more affordable.  Currently, she walks for 30 minutes four days a week and does 30 minutes of cardio before class on Fridays.The patient is also managing a busy schedule with work, school, and caring for her mother, which presents challenges for incorporating more physical activity into her routine.       Orexigenic Control: She has adequate satiety and satiation.   Barriers identified: lack of time for self-care, multiple competing priorities, and cost of medication.   Pharmacotherapy for weight loss: She is currently taking Wegovy with adequate clinical response  and without side effects..   Assessment and Plan   Treatment Plan For Obesity:  Recommended Dietary Goals  Avaley is currently in the action stage of change. As such, her goal is to continue weight management plan. She has agreed to: continue current plan  Behavioral Intervention  We discussed the following Behavioral Modification Strategies today: continue to work on maintaining a reduced calorie state, getting the recommended amount of protein, incorporating whole foods, making healthy choices, staying well hydrated and practicing mindfulness when eating..  Additional resources provided today: None  Recommended Physical Activity Goals  Shatonia has been advised to work up to 150 minutes of moderate intensity aerobic activity a  week and strengthening exercises 2-3 times per week for cardiovascular health, weight loss maintenance and preservation of muscle mass.   She has agreed to :  Increase the intensity, frequency or duration of strengthening exercises    Pharmacotherapy  We discussed various medication options to help Sallie with her weight loss efforts and we both agreed to : continue current anti-obesity medication regimen and we discussed other pharmacological strategies if she loses coverage for [facility].  This may include out-of-pocket for Zepbound or initiation of Qsymia.  Associated Conditions Addressed Today  Generalized obesity Start BMI 36.28 Assessment & Plan: Peak weight 197 Current weight: 152 Goal weight: 130 pounds Total body weight loss 22% Nutrition : 1000 kcal/day 80 to 90 g of protein Pharmacotherapy: Semaglutide 1.7 mg once a week last dose increase 04/09/2023  Patient may be losing coverage for GLP-1 therapy this is a source of concern to patient as she has made great progress using a multipronged approach we reviewed strategies for weight loss maintenance including increasing physical activity levels to 240 minutes a week which she finds unattainable.  She has a full-time job and is also going to graduate school.  We also advised on increasing protein to about 120 mg/day for appetite suppression.  We may also consider pain for Zepbound out-of-pocket or transitioning to Qsymia.  Orders: -     Semaglutide-Weight Management; Inject 1.7 mg into the skin once a week.  Dispense: 3 mL; Refill: 1  Prediabetes Assessment & Plan: Most recent hemoglobin A1c was 5.9 from June.  She is currently on semaglutide 1.7 mg once a week without any adverse effects.  No reports of hypoglycemia.  She has lost 22% of total body weight.  She will continue with medically supervised weight management plan.  Consider initiation of metformin for pharmacoprophylaxis if she could no longer afford GLP-1 therapy due to loss of coverage.   Orders: -     Semaglutide-Weight Management; Inject 1.7 mg into the skin once a week.  Dispense: 3 mL; Refill: 1           Objective   Physical Exam:  Blood pressure 96/62, pulse 72, temperature 98 F  (36.7 C), height 5\' 1"  (1.549 m), weight 152 lb (68.9 kg), SpO2 99%. Body mass index is 28.72 kg/m.  General: She is overweight, cooperative, alert, well developed, and in no acute distress. PSYCH: Has normal mood, affect and thought process.   HEENT: EOMI, sclerae are anicteric. Lungs: Normal breathing effort, no conversational dyspnea. Extremities: No edema.  Neurologic: No gross sensory or motor deficits. No tremors or fasciculations noted.    Diagnostic Data Reviewed:  BMET    Component Value Date/Time   NA 143 01/07/2023 1115   K 4.4 01/07/2023 1115   CL 107 (H) 01/07/2023 1115   CO2 22 01/07/2023 1115   GLUCOSE 81 01/07/2023 1115   BUN 12 01/07/2023 1115   CREATININE 0.85 01/07/2023 1115   CALCIUM 9.4 01/07/2023 1115   Lab Results  Component Value Date   HGBA1C 5.9 (H) 01/07/2023   HGBA1C 6.0 (H) 10/23/2021   Lab Results  Component Value Date   INSULIN 15.4 01/07/2023   INSULIN 17.3 10/23/2021   Lab Results  Component Value Date   TSH 1.070 10/23/2021   CBC    Component Value Date/Time   WBC 6.7 04/10/2021 1519   RBC 4.55 04/10/2021 1519   HGB 12.1 04/10/2021 1519   HCT 38.1 04/10/2021 1519   PLT 302.0 04/10/2021 1519   MCV  83.9 04/10/2021 1519   MCHC 31.8 04/10/2021 1519   RDW 14.4 04/10/2021 1519   Iron Studies No results found for: "IRON", "TIBC", "FERRITIN", "IRONPCTSAT" Lipid Panel     Component Value Date/Time   CHOL 161 05/07/2022 1101   TRIG 53 05/07/2022 1101   HDL 51 05/07/2022 1101   CHOLHDL 3.2 05/07/2022 1101   LDLCALC 99 05/07/2022 1101   Hepatic Function Panel     Component Value Date/Time   PROT 7.1 01/07/2023 1115   ALBUMIN 4.4 01/07/2023 1115   AST 16 01/07/2023 1115   ALT 15 01/07/2023 1115   ALKPHOS 74 01/07/2023 1115   BILITOT 0.3 01/07/2023 1115      Component Value Date/Time   TSH 1.070 10/23/2021 1006   Nutritional Lab Results  Component Value Date   VD25OH 39.4 01/07/2023   VD25OH 59.9 05/07/2022   VD25OH  36.5 10/23/2021    Follow-Up   Return in about 4 weeks (around 07/13/2023) for For Weight Mangement with Dr. Rikki Spearing.Marland Kitchen She was informed of the importance of frequent follow up visits to maximize her success with intensive lifestyle modifications for her multiple health conditions.  Attestation Statement   Reviewed by clinician on day of visit: allergies, medications, problem list, medical history, surgical history, family history, social history, and previous encounter notes.     Worthy Rancher, MD

## 2023-06-15 NOTE — Assessment & Plan Note (Signed)
Most recent hemoglobin A1c was 5.9 from June.  She is currently on semaglutide 1.7 mg once a week without any adverse effects.  No reports of hypoglycemia.  She has lost 22% of total body weight.  She will continue with medically supervised weight management plan.  Consider initiation of metformin for pharmacoprophylaxis if she could no longer afford GLP-1 therapy due to loss of coverage.

## 2023-06-15 NOTE — Assessment & Plan Note (Signed)
Peak weight 197 Current weight: 152 Goal weight: 130 pounds Total body weight loss 22% Nutrition : 1000 kcal/day 80 to 90 g of protein Pharmacotherapy: Semaglutide 1.7 mg once a week last dose increase 04/09/2023  Patient may be losing coverage for GLP-1 therapy this is a source of concern to patient as she has made great progress using a multipronged approach we reviewed strategies for weight loss maintenance including increasing physical activity levels to 240 minutes a week which she finds unattainable.  She has a full-time job and is also going to graduate school.  We also advised on increasing protein to about 120 mg/day for appetite suppression.  We may also consider pain for Zepbound out-of-pocket or transitioning to Qsymia.

## 2023-06-24 ENCOUNTER — Other Ambulatory Visit (INDEPENDENT_AMBULATORY_CARE_PROVIDER_SITE_OTHER): Payer: Self-pay | Admitting: Internal Medicine

## 2023-06-24 DIAGNOSIS — E559 Vitamin D deficiency, unspecified: Secondary | ICD-10-CM

## 2023-06-24 MED ORDER — VITAMIN D (ERGOCALCIFEROL) 1.25 MG (50000 UNIT) PO CAPS: 50000.0000 [IU] | ORAL_CAPSULE | ORAL | 0 refills | Status: DC

## 2023-07-12 ENCOUNTER — Ambulatory Visit (INDEPENDENT_AMBULATORY_CARE_PROVIDER_SITE_OTHER): Payer: Federal, State, Local not specified - PPO | Admitting: Adult Health

## 2023-07-12 ENCOUNTER — Encounter (INDEPENDENT_AMBULATORY_CARE_PROVIDER_SITE_OTHER): Payer: Self-pay | Admitting: Adult Health

## 2023-07-12 VITALS — BP 102/58 | HR 74 | Temp 98.5°F | Ht 61.0 in | Wt 152.0 lb

## 2023-07-12 DIAGNOSIS — G4733 Obstructive sleep apnea (adult) (pediatric): Secondary | ICD-10-CM

## 2023-07-12 DIAGNOSIS — R7303 Prediabetes: Secondary | ICD-10-CM | POA: Diagnosis not present

## 2023-07-12 DIAGNOSIS — E669 Obesity, unspecified: Secondary | ICD-10-CM

## 2023-07-12 DIAGNOSIS — E559 Vitamin D deficiency, unspecified: Secondary | ICD-10-CM | POA: Diagnosis not present

## 2023-07-12 DIAGNOSIS — Z6828 Body mass index (BMI) 28.0-28.9, adult: Secondary | ICD-10-CM

## 2023-07-12 MED ORDER — SEMAGLUTIDE-WEIGHT MANAGEMENT 1.7 MG/0.75ML ~~LOC~~ SOAJ
1.7000 mg | SUBCUTANEOUS | 1 refills | Status: DC
Start: 2023-07-12 — End: 2023-08-09

## 2023-07-12 MED ORDER — VITAMIN D (ERGOCALCIFEROL) 1.25 MG (50000 UNIT) PO CAPS: 50000.0000 [IU] | ORAL_CAPSULE | ORAL | 0 refills | Status: DC

## 2023-07-12 NOTE — Progress Notes (Addendum)
WEIGHT SUMMARY AND BIOMETRICS  Vitals Temp: 98.5 F (36.9 C) BP: (!) 102/58 Pulse Rate: 74 SpO2: 99 %   Anthropometric Measurements Height: 5\' 1"  (1.549 m) Weight: 152 lb (68.9 kg) BMI (Calculated): 28.74 Weight at Last Visit: 152 lb Weight Lost Since Last Visit: 0 Weight Gained Since Last Visit: 0 Starting Weight: 192 lb Total Weight Loss (lbs): 41 lb (18.6 kg) Peak Weight: 197 lb   Body Composition  Body Fat %: 36.2 % Fat Mass (lbs): 55 lbs Muscle Mass (lbs): 92 lbs Total Body Water (lbs): 66.4 lbs Visceral Fat Rating : 8   Other Clinical Data Fasting: No Labs: No Today's Visit #: 23 Starting Date: 10/23/21    Chief Complaint:   OBESITY Alexandra [name] is here to discuss her progress with her obesity treatment plan. She is on the the Category 1 Plan and states she is following her eating plan approximately 75 % of the time.  She states she is exercising: none   Interim History:  She is currently on weekly Wegovy 1.7mg  Denies mass in neck, dysphagia, dyspepsia, persistent hoarseness, abdominal pain, or N/V/C   Current weight 152 lbs with corresponding BMI 28.7 Goal weight 130-140s  She is very concerned about stopping GLP-1 therapy, as her insurance will not continue to cover in Jan 2025  Discussed at length alternatives, ie: Lifestyle, other medications  Exercise-none  She is currently working 10 hr shifts, 4 per week. In mid Jan- she will convert to Mon-Friday 8 hr shifts= more time to exercise in evenings!  Of Note- She has Mirena IUD  Subjective:   1. OSA (obstructive sleep apnea) She endorses stable energy levels. She has lost > 40 lbs!  2. Vitamin D deficiency  Latest Reference Range & Units 05/07/22 11:01 01/07/23 11:15  Vitamin D, 25-Hydroxy 30.0 - 100.0 ng/mL 59.9 39.4   Ergocalciferol is ordered Q14 days She reports missing some weeks  3. Prediabetes Lab Results  Component Value Date   HGBA1C 5.9 (H) 01/07/2023   HGBA1C 6.0 (H)  05/07/2022   HGBA1C 6.0 (H) 10/23/2021    She is currently on weekly Wegovy 1.7mg  Denies mass in neck, dysphagia, dyspepsia, persistent hoarseness, abdominal pain, or N/V/C   Current weight 152 lbs with corresponding BMI 28.7 Goal weight 130-140s  She is very concerned about stopping GLP-1 therapy, as her insurance will not continue to cover in Jan 2025  Discussed at length alternatives, ie: Lifestyle, other medications  Assessment/Plan:   1. OSA (obstructive sleep apnea) Continue with weight loss efforts  2. Vitamin D deficiency Refill - Vitamin D, Ergocalciferol, (DRISDOL) 1.25 MG (50000 UNIT) CAPS capsule; Take 1 capsule (50,000 Units total) by mouth every 14 (fourteen) days.  Dispense: 8 capsule; Refill: 0  3. Prediabetes Refill - Semaglutide-Weight Management 1.7 MG/0.75ML SOAJ; Inject 1.7 mg into the skin once a week.  Dispense: 3 mL; Refill: 1  4. Obesity with current BMI of 28.7 Refill - Semaglutide-Weight Management 1.7 MG/0.75ML SOAJ; Inject 1.7 mg into the skin once a week.  Dispense: 3 mL; Refill: 1  Alternatives for Wegovy therapy- 1) Lifestyle: increase regular exercise and follow prescribed meal plan 2) Metformin 3) Zepbound coupon  Alexandra [name] is currently in the action stage of change. As such, her goal is to continue with weight loss efforts. She has agreed to the Category 1 Plan.   Exercise goals: For substantial health benefits, adults should do at least 150 minutes (2 hours and 30 minutes) a week of moderate-intensity, or  75 minutes (1 hour and 15 minutes) a week of vigorous-intensity aerobic physical activity, or an equivalent combination of moderate- and vigorous-intensity aerobic activity. Aerobic activity should be performed in episodes of at least 10 minutes, and preferably, it should be spread throughout the week.  Behavioral modification strategies: increasing lean protein intake, decreasing simple carbohydrates, increasing vegetables, increasing water  intake, no skipping meals, meal planning and cooking strategies, keeping healthy foods in the home, ways to avoid boredom eating, travel eating strategies, holiday eating strategies , celebration eating strategies, avoiding temptations, and planning for success.  Alexandra [name] has agreed to follow-up with our clinic in 4 weeks. She was informed of the importance of frequent follow-up visits to maximize her success with intensive lifestyle modifications for her multiple health conditions.   Check Fasting Labs at next OV  Objective:   Blood pressure (!) 102/58, pulse 74, temperature 98.5 F (36.9 C), height 5\' 1"  (1.549 m), weight 152 lb (68.9 kg), SpO2 99%. Body mass index is 28.72 kg/m.  General: Cooperative, alert, well developed, in no acute distress. HEENT: Conjunctivae and lids unremarkable. Cardiovascular: Regular rhythm.  Lungs: Normal work of breathing. Neurologic: No focal deficits.   Lab Results  Component Value Date   CREATININE 0.85 01/07/2023   BUN 12 01/07/2023   NA 143 01/07/2023   K 4.4 01/07/2023   CL 107 (H) 01/07/2023   CO2 22 01/07/2023   Lab Results  Component Value Date   ALT 15 01/07/2023   AST 16 01/07/2023   ALKPHOS 74 01/07/2023   BILITOT 0.3 01/07/2023   Lab Results  Component Value Date   HGBA1C 5.9 (H) 01/07/2023   HGBA1C 6.0 (H) 05/07/2022   HGBA1C 6.0 (H) 10/23/2021   Lab Results  Component Value Date   INSULIN 15.4 01/07/2023   INSULIN 19.9 05/07/2022   INSULIN 17.3 10/23/2021   Lab Results  Component Value Date   TSH 1.070 10/23/2021   Lab Results  Component Value Date   CHOL 161 05/07/2022   HDL 51 05/07/2022   LDLCALC 99 05/07/2022   TRIG 53 05/07/2022   CHOLHDL 3.2 05/07/2022   Lab Results  Component Value Date   VD25OH 39.4 01/07/2023   VD25OH 59.9 05/07/2022   VD25OH 36.5 10/23/2021   Lab Results  Component Value Date   WBC 6.7 04/10/2021   HGB 12.1 04/10/2021   HCT 38.1 04/10/2021   MCV 83.9 04/10/2021   PLT 302.0  04/10/2021   No results found for: "IRON", "TIBC", "FERRITIN"  Attestation Statements:   Reviewed by clinician on day of visit: allergies, medications, problem list, medical history, surgical history, family history, social history, and previous encounter notes.  I have reviewed the above documentation for accuracy and completeness, and I agree with the above. -  Brenee Gajda d. Alliah Boulanger, NP-C

## 2023-08-09 ENCOUNTER — Telehealth (INDEPENDENT_AMBULATORY_CARE_PROVIDER_SITE_OTHER): Payer: Federal, State, Local not specified - PPO | Admitting: Internal Medicine

## 2023-08-09 VITALS — Ht 61.0 in | Wt 152.0 lb

## 2023-08-09 DIAGNOSIS — E669 Obesity, unspecified: Secondary | ICD-10-CM | POA: Diagnosis not present

## 2023-08-09 DIAGNOSIS — R7303 Prediabetes: Secondary | ICD-10-CM | POA: Diagnosis not present

## 2023-08-09 DIAGNOSIS — Z6828 Body mass index (BMI) 28.0-28.9, adult: Secondary | ICD-10-CM

## 2023-08-09 MED ORDER — PHENTERMINE-TOPIRAMATE 7.5-46 MG PO CP24
1.0000 | ORAL_CAPSULE | Freq: Every day | ORAL | 0 refills | Status: DC
Start: 2023-08-09 — End: 2023-09-06

## 2023-08-09 MED ORDER — QSYMIA 3.75-23 MG PO CP24
1.0000 | ORAL_CAPSULE | Freq: Every day | ORAL | 0 refills | Status: DC
Start: 2023-08-09 — End: 2023-09-06

## 2023-08-09 NOTE — Progress Notes (Signed)
Office: [phone]  /  Fax: [phone]  I connected with  Alexandra Alexandra [name] on 08/09/23 by a video enabled telemedicine application Alexandra verified that I am speaking with the correct person using two  identifiers.  Location: Patient at home, Provider in office.   I discussed the limitations of evaluation Alexandra management by telemedicine. The patient expressed understanding Alexandra agreed to proceed.    Weight Summary Alexandra Biometrics  No data recorded Anthropometric Measurements Height: 5\' 1"  (1.549 m) Weight: 152 lb (68.9 kg) BMI (Calculated): 28.74 Weight at Last Visit: 152 lb Starting Weight: 192 lb Total Weight Loss (lbs): 41 lb (18.6 kg) Peak Weight: 197 lb   No data recorded  No data recorded Today's Visit #: 24  Starting Date: 10/23/21   Subjective   Chief Complaint: Obesity  Alexandra Alexandra [name] is here to discuss her progress with her obesity treatment plan. She is on the the Category 1 Plan Alexandra states she is following her eating plan approximately 50 % of the time. She states she is exercising 30 minutes 2 times per week.  Weight Progress Since Last Visit:  Since last office visit she has maintained weight. She is on vacation She reports fair adherence to reduced calorie nutritional plan. She has been working on not skipping meals, increasing protein intake at every meal, drinking more water, making healthier choices, reducing portion sizes, Alexandra incorporating more whole foods   Patient Reported Barriers to Progress: cost of medication Alexandra strong hunger Alexandra [name] Alexandra/or impaired satiety / inhibitory control.   Alexandra Alexandra [name].  Denies problems with satiety Alexandra Alexandra [name].  Denies problems with eating patterns Alexandra portion control.  Denies abnormal cravings. Denies feeling deprived or restricted.   Pharmacotherapy for weight management: She is currently taking Wegovy with adequate clinical response  Alexandra without side  effects.. Need to change medications due to increased copay  Assessment Alexandra Plan   Treatment Plan For Obesity:  Recommended Dietary Goals  Alexandra [name] is currently in the action stage of change. As such, her goal is to continue weight management plan. She has agreed to: continue current plan  Behavioral Health Alexandra Counseling  We discussed the following behavioral modification strategies today: continue to work on maintaining a reduced calorie state, getting the recommended amount of protein, incorporating whole foods, making healthy choices, staying well hydrated Alexandra practicing mindfulness when eating..  Additional education Alexandra resources provided today: None  Recommended Physical Activity Goals  Alexandra [name] has been advised to work up to 150 minutes of moderate intensity aerobic activity a week Alexandra strengthening exercises 2-3 times per week for cardiovascular health, weight loss maintenance Alexandra preservation of muscle mass.   She has agreed to :  Think about enjoyable ways to increase daily physical activity Alexandra overcoming barriers to exercise Alexandra Increase physical activity in their day Alexandra reduce sedentary time (increase NEAT).  Pharmacotherapy  We discussed various medication options to help Alexandra [name] with her weight loss efforts Alexandra we both agreed to :  d/c wegovy due to cost.  After discussion of benefits Alexandra side effects she will be started on Qsymia.  Patient to sign controlled substance agreement at her face-to-face visit in February.  We reviewed state registry for controlled substances.  She has an IUD Alexandra may be menopausal.  The benefits Alexandra risks of phentermine/topiramate combination therapy for weight management were discussed in detail with the patient today. The patient was informed of the following key points:  Benefits:  Phentermine/topiramate is  an FDA-approved combination medication that has been shown to aid in weight loss, particularly in individuals with overweight or  obesity. Phentermine, a sympathomimetic agent, works by reducing appetite, while topiramate, an anticonvulsant, helps reduce hunger Alexandra enhances feelings of fullness. This medication can help patients achieve significant weight loss, which may improve comorbid conditions such as type 2 diabetes, hypertension, Alexandra dyslipidemia. Weight loss achieved through phentermine/topiramate therapy may also reduce the risk of developing cardiovascular disease Alexandra other obesity-related health problems. The therapy is intended to be used in conjunction with lifestyle modifications, including a balanced diet Alexandra regular physical activity, to achieve Alexandra maintain long-term weight loss. Risks:  Common side effects include dry mouth, constipation, insomnia, dizziness, Alexandra tingling in the hands Alexandra feet (paresthesia). These side effects often improve over time or with adjustments to the dosage. There may be an increased risk of mood changes, including anxiety, depression, Alexandra irritability. Patients should be monitored for any signs of mood disturbances Alexandra instructed to report any mental health changes promptly. Phentermine/topiramate can cause increased heart rate, Alexandra patients with a history of cardiovascular issues, such as hypertension or arrhythmias, should be carefully monitored. There is a potential risk of cognitive side effects such as memory problems, difficulty concentrating, Alexandra word-finding difficulties, particularly with long-term use. Phentermine/topiramate is contraindicated in pregnancy due to the risk of fetal harm, Alexandra patients should be advised to use effective contraception if there is a possibility of becoming pregnant. Other risks include potential kidney stones, metabolic acidosis, Alexandra an increased risk of elevated blood pressure if not appropriately monitored. The patient was informed of the importance of adhering to the prescribed dosage Alexandra lifestyle changes to maximize the effectiveness of the  medication. They were also advised to be vigilant for any side effects, particularly changes in mood or cognition, Alexandra to report these promptly.  The patient voiced understanding of the potential benefits Alexandra risks associated with phentermine/topiramate for weight management Alexandra expressed interest in proceeding with this treatment option.  Plan:  Reinforce the importance of a comprehensive weight management approach that includes diet, exercise, Alexandra behavioral strategies. Schedule a follow-up appointment in 4 weeks to monitor progress, side effects, Alexandra overall response to treatment. The patient has been given the opportunity to ask questions Alexandra has verbally acknowledged understanding of the discussion.   Associated Conditions Impacted by Obesity Treatment  Generalized obesity -     Qsymia; Take 1 capsule by mouth daily.  Dispense: 14 capsule; Refill: 0 -     Phentermine-Topiramate; Take 1 capsule by mouth daily.  Dispense: 30 capsule; Refill: 0  Prediabetes    Maciel has lost approximately 45 pounds or 23% of total body weight since March 2023 on medically supervised weight management plan inclusive of GLP-1 therapy with Wegovy.  Her insurance coverage has changed this year Alexandra medication has become cost prohibitive.  She is therefore requesting other treatment options.  We reviewed today the benefits Alexandra risk associated with Qsymia Alexandra she is agreeable to starting medication.  She has sleep apnea but has not required PAP therapy because of her weight loss.  She also has a history of prediabetes which had improved Alexandra we will consider initiating metformin for pharmacoprophylaxis at a future visit.  She will follow-up with [place] in 4 weeks.  Objective   Physical Exam:  Height 5\' 1"  (1.549 m), weight 152 lb (68.9 kg). Body mass index is 28.72 kg/m.  General: She is overweight, cooperative, alert, well developed, Alexandra in no acute distress. PSYCH:  Has normal mood, affect Alexandra thought process.    HEENT: EOMI, sclerae are anicteric. Lungs: Normal breathing effort, no conversational dyspnea. Extremities: No edema.  Neurologic: No gross sensory or motor deficits. No tremors or fasciculations noted.    Diagnostic Data Reviewed:  BMET    Component Value Date/Time   NA 143 01/07/2023 1115   K 4.4 01/07/2023 1115   CL 107 (H) 01/07/2023 1115   CO2 22 01/07/2023 1115   GLUCOSE 81 01/07/2023 1115   BUN 12 01/07/2023 1115   CREATININE 0.85 01/07/2023 1115   CALCIUM 9.4 01/07/2023 1115   Lab Results  Component Value Date   HGBA1C 5.9 (H) 01/07/2023   HGBA1C 6.0 (H) 10/23/2021   Lab Results  Component Value Date   INSULIN 15.4 01/07/2023   INSULIN 17.3 10/23/2021   Lab Results  Component Value Date   TSH 1.070 10/23/2021   CBC    Component Value Date/Time   WBC 6.7 04/10/2021 1519   RBC 4.55 04/10/2021 1519   HGB 12.1 04/10/2021 1519   HCT 38.1 04/10/2021 1519   PLT 302.0 04/10/2021 1519   MCV 83.9 04/10/2021 1519   MCHC 31.8 04/10/2021 1519   RDW 14.4 04/10/2021 1519   Iron Studies No results found for: "IRON", "TIBC", "FERRITIN", "IRONPCTSAT" Lipid Panel     Component Value Date/Time   CHOL 161 05/07/2022 1101   TRIG 53 05/07/2022 1101   HDL 51 05/07/2022 1101   CHOLHDL 3.2 05/07/2022 1101   LDLCALC 99 05/07/2022 1101   Hepatic Function Panel     Component Value Date/Time   PROT 7.1 01/07/2023 1115   ALBUMIN 4.4 01/07/2023 1115   AST 16 01/07/2023 1115   ALT 15 01/07/2023 1115   ALKPHOS 74 01/07/2023 1115   BILITOT 0.3 01/07/2023 1115      Component Value Date/Time   TSH 1.070 10/23/2021 1006   Nutritional Lab Results  Component Value Date   VD25OH 39.4 01/07/2023   VD25OH 59.9 05/07/2022   VD25OH 36.5 10/23/2021    Follow-Up   No follow-ups on file.Marland Kitchen She was informed of the importance of frequent follow up visits to maximize her success with intensive lifestyle modifications for her multiple health conditions.  Attestation Statement    Reviewed by clinician on day of visit: allergies, medications, problem list, medical history, surgical history, family history, social history, Alexandra previous encounter notes.   I have spent 20 minutes in the care of the patient today including: preparing to see patient (e.g. review Alexandra interpretation of tests, old notes ), obtaining Alexandra/or reviewing separately obtained history, performing a medically appropriate examination or evaluation, counseling Alexandra educating the patient, ordering medications, test or procedures, documenting clinical information in the electronic or other health care record, Alexandra independently interpreting results Alexandra communicating results to the patient, family, or caregiver   Worthy Rancher, MD

## 2023-08-23 DIAGNOSIS — S8391XA Sprain of unspecified site of right knee, initial encounter: Secondary | ICD-10-CM | POA: Diagnosis not present

## 2023-08-25 ENCOUNTER — Encounter (INDEPENDENT_AMBULATORY_CARE_PROVIDER_SITE_OTHER): Payer: Self-pay | Admitting: Internal Medicine

## 2023-08-25 DIAGNOSIS — G4733 Obstructive sleep apnea (adult) (pediatric): Secondary | ICD-10-CM | POA: Diagnosis not present

## 2023-08-26 ENCOUNTER — Encounter (INDEPENDENT_AMBULATORY_CARE_PROVIDER_SITE_OTHER): Payer: Self-pay

## 2023-08-26 ENCOUNTER — Telehealth (INDEPENDENT_AMBULATORY_CARE_PROVIDER_SITE_OTHER): Payer: Self-pay

## 2023-08-26 NOTE — Telephone Encounter (Signed)
 PA for  Qsymia  denied, patient advised.

## 2023-09-06 ENCOUNTER — Encounter: Payer: Self-pay | Admitting: Neurology

## 2023-09-06 ENCOUNTER — Encounter (INDEPENDENT_AMBULATORY_CARE_PROVIDER_SITE_OTHER): Payer: Self-pay | Admitting: Internal Medicine

## 2023-09-06 ENCOUNTER — Ambulatory Visit: Payer: Federal, State, Local not specified - PPO | Admitting: Neurology

## 2023-09-06 ENCOUNTER — Ambulatory Visit (INDEPENDENT_AMBULATORY_CARE_PROVIDER_SITE_OTHER): Payer: Federal, State, Local not specified - PPO | Admitting: Internal Medicine

## 2023-09-06 VITALS — BP 106/69 | HR 81 | Ht 61.0 in | Wt 153.0 lb

## 2023-09-06 VITALS — BP 97/61 | HR 81 | Temp 97.9°F | Ht 61.0 in | Wt 153.0 lb

## 2023-09-06 DIAGNOSIS — E669 Obesity, unspecified: Secondary | ICD-10-CM

## 2023-09-06 DIAGNOSIS — Z6828 Body mass index (BMI) 28.0-28.9, adult: Secondary | ICD-10-CM | POA: Diagnosis not present

## 2023-09-06 DIAGNOSIS — R202 Paresthesia of skin: Secondary | ICD-10-CM | POA: Diagnosis not present

## 2023-09-06 DIAGNOSIS — R7303 Prediabetes: Secondary | ICD-10-CM

## 2023-09-06 DIAGNOSIS — M24571 Contracture, right ankle: Secondary | ICD-10-CM | POA: Diagnosis not present

## 2023-09-06 DIAGNOSIS — M205X2 Other deformities of toe(s) (acquired), left foot: Secondary | ICD-10-CM | POA: Diagnosis not present

## 2023-09-06 MED ORDER — SEMAGLUTIDE-WEIGHT MANAGEMENT 1 MG/0.5ML ~~LOC~~ SOAJ
1.0000 mg | SUBCUTANEOUS | 0 refills | Status: AC
Start: 2023-09-06 — End: 2023-10-04

## 2023-09-06 NOTE — Assessment & Plan Note (Addendum)
Kareema has been managing her weight with medication and lifestyle changes. She was previously on Wegovy but has been off it for two months due to insurance issues. Despite this, she has only gained one pound, indicating good maintenance of her weight loss. She has been engaging in physical activities like kickboxing but had to stop due to a sprained knee. She has noticed a slight increase in her taste for potatoes but has not experienced increased hunger or decreased fullness. Discussed the option of paying out of pocket for Qsymia or using generic components (phentermine and topiramate) off-label. Also discussed the high cost of Zepbound and the potential for insurance to cover [facility] with a savings offer. - Apply for [employer] savings offer at [email or web address] to reduce copayment. - Restart Wegovy at 1 mg dose. - Follow up in four weeks to assess progress and adjust dosage if necessary. - If Reginal Lutes is not covered, consider prescribing phentermine and potentially adding topiramate later.  Taniesha has been able to maintain her weight loss despite being off medication for two months. Discussed the fat mass set point theory and emphasized the importance of maintaining lifestyle changes to reset her set point. Key elements include adequate protein intake, physical activity, stress management, and sufficient sleep. Explained that 90% of people regain weight due to the fat mass set point and that maintaining weight loss for 6-12 months can help reset this set point. - Continue to focus on 30-40 grams of protein intake per meal. - Engage in 200 minutes of physical activity per week, considering alternatives like chair exercises or aqua activities if the gym is closed. - Manage stress and ensure 7-8 hours of sleep per night.

## 2023-09-06 NOTE — Progress Notes (Signed)
Office: [phone]  /  Fax: [phone]  Weight Summary And Biometrics  Vitals Temp: 97.9 F (36.6 C) BP: 97/61 Pulse Rate: 81 SpO2: 93 %   Anthropometric Measurements Height: 5\' 1"  (1.549 m) Weight: 153 lb (69.4 kg) BMI (Calculated): 28.92 Weight at Last Visit: 152 lb (Last in office visit 07/12/2023) Weight Lost Since Last Visit: 0 Weight Gained Since Last Visit: 1 lb Starting Weight: 192 lb Total Weight Loss (lbs): 40 lb (18.1 kg) Peak Weight: 197 lb   Body Composition  Body Fat %: 38 % Fat Mass (lbs): 58.2 lbs Muscle Mass (lbs): 90.4 lbs Total Body Water (lbs): 68 lbs Visceral Fat Rating : 9    No data recorded Today's Visit #: 25  Starting Date: 10/23/21   Subjective   Chief Complaint: Obesity  Alexandra [name] is here to discuss her progress with her obesity treatment plan. She is on the the Category 1 Plan and states she is following her eating plan approximately 25 % of the time. She states she is not currently exercising.  Interval History:   Discussed the use of AI scribe software for clinical note transcription with the patient, who gave verbal consent to proceed.  History of Present Illness   Alexandra [name] is a 58 year old female who presents for medical weight management.  She has been off [facility] for about two months and has gained one pound during this period. Despite stopping Wegovy, she has not noticed a significant increase in hunger or a decreased sense of fullness. She mentions a renewed taste for potatoes but is mindful of her consumption.  Her insurance does not cover Qsymia due to not meeting certain health criteria, and she is exploring options for medication coverage. Reginal Lutes is covered by her insurance but at a higher tier, resulting in increased out-of-pocket costs. Her current medication regimen is under review due to these insurance coverage issues. She is considering options for continuing Wegovy or switching to other medications like  phentermine and topiramate, depending on cost and coverage.  She has been engaging in kickboxing to build muscle but sustained a sprain, which has limited her ability to exercise.       Orexigenic Control:  Denies problems with appetite and hunger signals.  Denies problems with satiety and satiation.  Denies problems with eating patterns and portion control.  Denies abnormal cravings. Denies feeling deprived or restricted.   Barriers identified: multiple competing priorities, cost of medication, and moderate to high levels of stress.   Pharmacotherapy for weight loss: She is currently taking no anti-obesity medication and had been on Wegovy but has stopped medication for about 2 months due to lack of coverage .   Assessment and Plan   Treatment Plan For Obesity:  Recommended Dietary Goals  Alexandra [name] is currently in the action stage of change. As such, her goal is to continue weight management plan. She has agreed to: continue current plan  Behavioral Intervention  We discussed the following Behavioral Modification Strategies today: continue to work on maintaining a reduced calorie state, getting the recommended amount of protein, incorporating whole foods, making healthy choices, staying well hydrated and practicing mindfulness when eating..  Additional resources provided today: None  Recommended Physical Activity Goals  Alexandra [name] has been advised to work up to 150 minutes of moderate intensity aerobic activity a week and strengthening exercises 2-3 times per week for cardiovascular health, weight loss maintenance and preservation of muscle mass.   She has agreed to :  Think about  enjoyable ways to increase daily physical activity and overcoming barriers to exercise and Increase physical activity in their day and reduce sedentary time (increase NEAT).  Pharmacotherapy  We discussed various medication options to help Alexandra [name] with her weight loss efforts and we both agreed to :   Continue GLP-1 therapy she will look into savings card to see if it reduces her out-of-pocket expense.  She benefits from ongoing GLP-1 for maintenance  Associated Conditions Addressed Today  Prediabetes Assessment & Plan: Most recent hemoglobin A1c was 5.9 from June.  She is no longer on semaglutide.  She has lost 22% of total body weight.  She will continue with medically supervised weight management plan.  Consider initiation of metformin for pharmacoprophylaxis if she could no longer afford GLP-1 therapy due to loss of coverage.    Generalized obesity Start BMI 36.28 Assessment & Plan: Alexandra [name] has been managing her weight with medication and lifestyle changes. She was previously on Wegovy but has been off it for two months due to insurance issues. Despite this, she has only gained one pound, indicating good maintenance of her weight loss. She has been engaging in physical activities like kickboxing but had to stop due to a sprained knee. She has noticed a slight increase in her taste for potatoes but has not experienced increased hunger or decreased fullness. Discussed the option of paying out of pocket for Qsymia or using generic components (phentermine and topiramate) off-label. Also discussed the high cost of Zepbound and the potential for insurance to cover [facility] with a savings offer. - Apply for [employer] savings offer at [email or web address] to reduce copayment. - Restart Wegovy at 1 mg dose. - Follow up in four weeks to assess progress and adjust dosage if necessary. - If Reginal Lutes is not covered, consider prescribing phentermine and potentially adding topiramate later.  Alexandra [name] has been able to maintain her weight loss despite being off medication for two months. Discussed the fat mass set point theory and emphasized the importance of maintaining lifestyle changes to reset her set point. Key elements include adequate protein intake, physical activity, stress management, and sufficient sleep. Explained  that 90% of people regain weight due to the fat mass set point and that maintaining weight loss for 6-12 months can help reset this set point. - Continue to focus on 30-40 grams of protein intake per meal. - Engage in 200 minutes of physical activity per week, considering alternatives like chair exercises or aqua activities if the gym is closed. - Manage stress and ensure 7-8 hours of sleep per night.  Orders: -     Semaglutide-Weight Management; Inject 1 mg into the skin once a week for 28 days.  Dispense: 2 mL; Refill: 0  BMI 28.0-28.9,adult      Objective   Physical Exam:  Blood pressure 97/61, pulse 81, temperature 97.9 F (36.6 C), height 5\' 1"  (1.549 m), weight 153 lb (69.4 kg), SpO2 93%. Body mass index is 28.91 kg/m.  General: She is overweight, cooperative, alert, well developed, and in no acute distress. PSYCH: Has normal mood, affect and thought process.   HEENT: EOMI, sclerae are anicteric. Lungs: Normal breathing effort, no conversational dyspnea. Extremities: No edema.  Neurologic: No gross sensory or motor deficits. No tremors or fasciculations noted.    Diagnostic Data Reviewed:  BMET    Component Value Date/Time   NA 143 01/07/2023 1115   K 4.4 01/07/2023 1115   CL 107 (H) 01/07/2023 1115   CO2 22 01/07/2023 1115  GLUCOSE 81 01/07/2023 1115   BUN 12 01/07/2023 1115   CREATININE 0.85 01/07/2023 1115   CALCIUM 9.4 01/07/2023 1115   Lab Results  Component Value Date   HGBA1C 5.9 (H) 01/07/2023   HGBA1C 6.0 (H) 10/23/2021   Lab Results  Component Value Date   INSULIN 15.4 01/07/2023   INSULIN 17.3 10/23/2021   Lab Results  Component Value Date   TSH 1.070 10/23/2021   CBC    Component Value Date/Time   WBC 6.7 04/10/2021 1519   RBC 4.55 04/10/2021 1519   HGB 12.1 04/10/2021 1519   HCT 38.1 04/10/2021 1519   PLT 302.0 04/10/2021 1519   MCV 83.9 04/10/2021 1519   MCHC 31.8 04/10/2021 1519   RDW 14.4 04/10/2021 1519   Iron Studies No  results found for: "IRON", "TIBC", "FERRITIN", "IRONPCTSAT" Lipid Panel     Component Value Date/Time   CHOL 161 05/07/2022 1101   TRIG 53 05/07/2022 1101   HDL 51 05/07/2022 1101   CHOLHDL 3.2 05/07/2022 1101   LDLCALC 99 05/07/2022 1101   Hepatic Function Panel     Component Value Date/Time   PROT 7.1 01/07/2023 1115   ALBUMIN 4.4 01/07/2023 1115   AST 16 01/07/2023 1115   ALT 15 01/07/2023 1115   ALKPHOS 74 01/07/2023 1115   BILITOT 0.3 01/07/2023 1115      Component Value Date/Time   TSH 1.070 10/23/2021 1006   Nutritional Lab Results  Component Value Date   VD25OH 39.4 01/07/2023   VD25OH 59.9 05/07/2022   VD25OH 36.5 10/23/2021    Follow-Up   Return in about 4 weeks (around 10/04/2023) for For Weight Mangement with Dr. Rikki Spearing.Marland Kitchen She was informed of the importance of frequent follow up visits to maximize her success with intensive lifestyle modifications for her multiple health conditions.  Attestation Statement   Reviewed by clinician on day of visit: allergies, medications, problem list, medical history, surgical history, family history, social history, and previous encounter notes.     Worthy Rancher, MD

## 2023-09-06 NOTE — Progress Notes (Signed)
Chief Complaint  Patient presents with   New Patient (Initial Visit)    Rm12, alone, NP/referral from/[facility] [place] [facility] [phone] paresthesia:left hand numbness and tingly started few months ago and went away about a month ago      ASSESSMENT AND PLAN  Alexandra [name] is a 58 y.o. female   Transient left arm and hand  paresthesia  Asymptomatic now, normal neurological examination  Differentiation diagnosis includes cervical radiculopathy versus upper extremity focal neuropathy  Call clinic for recurrent symptoms  DIAGNOSTIC DATA (LABS, IMAGING, TESTING) - I reviewed patient records, labs, notes, testing and imaging myself where available.   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 I personally reviewed pertinent available imaging films in PACS.   PMHx of  Obesity, lost 40 Lb OSA-CPAP  In December 2024, without clear triggers, she noticed intermittent left lateral 3 finger numbness tingling, no pain, sometimes paresthesia extending to left forearm, even above left elbow, she denies significant neck pain, no weakness, no right arm involvement, no gait abnormality  She stretched her arm, her symptoms gradually improved, lasting for 2 months, she is asymptomatic now  PHYSICAL EXAM:   Vitals:   09/06/23 1601  BP: 106/69  Pulse: 81  Weight: 153 lb (69.4 kg)  Height: 5\' 1"  (1.549 m)    Body mass index is 28.91 kg/m.  PHYSICAL EXAMNIATION:  Gen: NAD, conversant, well nourised, well groomed                     Cardiovascular: Regular rate rhythm, no peripheral edema, warm, nontender. Eyes: Conjunctivae clear without exudates or hemorrhage Neck: Supple, no carotid bruits. Pulmonary:  Clear to auscultation bilaterally   NEUROLOGICAL EXAM:  MENTAL STATUS: Speech/cognition: Awake, alert, oriented to history taking and casual conversation CRANIAL NERVES: CN II: Visual fields are full to confrontation. Pupils are round equal and briskly reactive to light. CN III, IV, VI: extraocular movement are normal. No ptosis. CN V: Facial sensation is intact to light touch CN VII: Face is symmetric with normal eye closure  CN VIII: Hearing is normal to causal conversation. CN IX, X: Phonation is normal. CN XI: Head turning and shoulder shrug are intact  MOTOR: There is no pronator drift of out-stretched arms. Muscle bulk and tone are normal. Muscle strength is normal.  REFLEXES: Reflexes are 2+ and symmetric at the biceps, triceps, knees, and ankles. Plantar responses are flexor.  SENSORY: Intact to light touch, pinprick and vibratory sensation are intact in fingers and toes.  COORDINATION: There is no trunk or limb dysmetria noted.  GAIT/STANCE: Posture is normal. Gait is steady with normal steps, base, arm swing, and turning. Heel and toe walking are normal. Tandem gait is normal.  Romberg is absent.  REVIEW OF SYSTEMS:  Full 14 system review of systems performed and notable only for as above All other review of systems were negative.   ALLERGIES: Allergies  Allergen Reactions   Penicillins Swelling    Patient reports he tongue swelled.    HOME MEDICATIONS: Current Outpatient Medications  Medication Sig Dispense Refill   albuterol (VENTOLIN HFA) 108 (90 Base) MCG/ACT inhaler      [place] Saline Nasal No-Drip GEL Place 1 spray into the nose every 8 (eight) hours  as needed. 22 mL 11   clobetasol (OLUX) 0.05 % topical foam Apply topically 2 (two) times daily. 50 g 5   fluticasone (FLONASE) 50 MCG/ACT nasal spray SHAKE LIQUID AND USE 1 SPRAY IN EACH NOSTRIL DAILY 16 g 11   Semaglutide-Weight Management 1 MG/0.5ML SOAJ Inject 1 mg into the skin once a week for 28 days.  2 mL 0   tacrolimus (PROTOPIC) 0.1 % ointment Apply topically at bedtime. 100 g 3   Vitamin D, Ergocalciferol, (DRISDOL) 1.25 MG (50000 UNIT) CAPS capsule Take 1 capsule (50,000 Units total) by mouth every 14 (fourteen) days. 8 capsule 0   No current facility-administered medications for this visit.    PAST MEDICAL HISTORY: 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, unspecified    Seasonal allergies    Sleep apnea    Spasm     PAST SURGICAL HISTORY: Past Surgical History:  Procedure Laterality Date   CESAREAN SECTION     CHOLECYSTECTOMY      FAMILY HISTORY: Family History  Problem Relation Age of Onset   Breast cancer Mother    Depression Mother    High Cholesterol Mother    Obesity Mother    High blood pressure Father    Sleep apnea Father    Breast cancer Maternal Aunt     SOCIAL HISTORY: Social History   Socioeconomic History   Marital status: Married    Spouse name: Tinnie Gens   Number of children: Not on file   Years of education: Not on file   Highest education level: Not on file  Occupational History   Occupation: Nurse  Tobacco Use   Smoking status: Never   Smokeless tobacco: Never  Substance and Sexual Activity   Alcohol use: Yes   Drug use: Never   Sexual activity: Not on file  Other Topics Concern   Not on file  Social History Narrative   Not on file   Social Drivers of Health   Financial Resource Strain: Not on file  Food Insecurity: Not on file  Transportation Needs: Not on file  Physical Activity: Not on file  Stress: Not on file  Social Connections: Not on file  Intimate Partner Violence: Not on file      Levert Feinstein, M.D. Ph.D.  [facility] [address], [address] [place], [place] [address] Ph: [phone] Fax: [phone]  CC:  Reynaldo Minium, [place] [address] [place],  [place] [address]  Deatra James, MD  +

## 2023-09-06 NOTE — Assessment & Plan Note (Signed)
Most recent hemoglobin A1c was 5.9 from June.  She is no longer on semaglutide.  She has lost 22% of total body weight.  She will continue with medically supervised weight management plan.  Consider initiation of metformin for pharmacoprophylaxis if she could no longer afford GLP-1 therapy due to loss of coverage.

## 2023-09-07 ENCOUNTER — Ambulatory Visit (INDEPENDENT_AMBULATORY_CARE_PROVIDER_SITE_OTHER): Payer: Federal, State, Local not specified - PPO | Admitting: Internal Medicine

## 2023-09-08 ENCOUNTER — Encounter (INDEPENDENT_AMBULATORY_CARE_PROVIDER_SITE_OTHER): Payer: Self-pay | Admitting: Internal Medicine

## 2023-09-09 ENCOUNTER — Other Ambulatory Visit (INDEPENDENT_AMBULATORY_CARE_PROVIDER_SITE_OTHER): Payer: Self-pay | Admitting: Internal Medicine

## 2023-09-09 DIAGNOSIS — E669 Obesity, unspecified: Secondary | ICD-10-CM

## 2023-09-09 MED ORDER — PHENTERMINE HCL 37.5 MG PO TABS
18.7500 mg | ORAL_TABLET | Freq: Every day | ORAL | 0 refills | Status: DC
Start: 2023-09-09 — End: 2023-10-05

## 2023-09-10 DIAGNOSIS — Z23 Encounter for immunization: Secondary | ICD-10-CM | POA: Diagnosis not present

## 2023-09-10 DIAGNOSIS — S8390XA Sprain of unspecified site of unspecified knee, initial encounter: Secondary | ICD-10-CM | POA: Diagnosis not present

## 2023-09-10 DIAGNOSIS — Z0000 Encounter for general adult medical examination without abnormal findings: Secondary | ICD-10-CM | POA: Diagnosis not present

## 2023-09-14 ENCOUNTER — Telehealth (INDEPENDENT_AMBULATORY_CARE_PROVIDER_SITE_OTHER): Payer: Self-pay

## 2023-09-14 NOTE — Telephone Encounter (Signed)
 Pt picked up Phentermine for 18.00

## 2023-10-05 ENCOUNTER — Telehealth (INDEPENDENT_AMBULATORY_CARE_PROVIDER_SITE_OTHER): Payer: Federal, State, Local not specified - PPO | Admitting: Internal Medicine

## 2023-10-05 ENCOUNTER — Encounter (INDEPENDENT_AMBULATORY_CARE_PROVIDER_SITE_OTHER): Payer: Self-pay | Admitting: Internal Medicine

## 2023-10-05 VITALS — Ht 61.0 in | Wt 153.0 lb

## 2023-10-05 DIAGNOSIS — R7303 Prediabetes: Secondary | ICD-10-CM | POA: Diagnosis not present

## 2023-10-05 DIAGNOSIS — Z6828 Body mass index (BMI) 28.0-28.9, adult: Secondary | ICD-10-CM | POA: Diagnosis not present

## 2023-10-05 DIAGNOSIS — R638 Other symptoms and signs concerning food and fluid intake: Secondary | ICD-10-CM | POA: Insufficient documentation

## 2023-10-05 DIAGNOSIS — E669 Obesity, unspecified: Secondary | ICD-10-CM

## 2023-10-05 MED ORDER — ZONISAMIDE 25 MG PO CAPS
25.0000 mg | ORAL_CAPSULE | Freq: Every evening | ORAL | 0 refills | Status: DC
Start: 2023-10-05 — End: 2024-02-07

## 2023-10-05 MED ORDER — PHENTERMINE HCL 37.5 MG PO TABS: 18.7500 mg | ORAL_TABLET | Freq: Every day | ORAL | 0 refills | Status: DC

## 2023-10-05 NOTE — Assessment & Plan Note (Signed)
 Worsened since discontinuation of GLP-1 due to cost.  She has increased orexigenic signaling, impaired satiety and inhibitory control. This is secondary to an abnormal energy regulation system and pathological neurohormonal pathways characteristic of excess adiposity.  In addition to nutritional and behavioral strategies she benefits from pharmacotherapy.  She will continue with phentermine in the morning we will add zonisamide 25 mg in the evening.

## 2023-10-05 NOTE — Assessment & Plan Note (Signed)
 Most recent hemoglobin A1c was 5.9 from June.  She is no longer on semaglutide.  She has lost 22% of total body weight.  She will continue with medically supervised weight management plan.  Consider initiation of metformin for pharmacoprophylaxis.

## 2023-10-05 NOTE — Progress Notes (Signed)
 Virtual Visit via Video Note  I connected withNAME@ on 10/05/23 at  4:00 PM EDT by a video enabled telemedicine application and verified that I am speaking with the correct person using two identifiers.  Location:  Patient: Vehicle Provider: Office   I discussed the limitations of evaluation and management by telemedicine and the availability of in person appointments. The patient expressed understanding and agreed to proceed.    Weight Summary And Biometrics  No data recorded Anthropometric Measurements Height: 5\' 1"  (1.549 m) Weight: 153 lb (69.4 kg) (at visit on 09/06/2023) BMI (Calculated): 28.92 Weight Lost Since Last Visit: 0 Weight Gained Since Last Visit: 1 lb Starting Weight: 192 lb Total Weight Loss (lbs): 39 lb (17.7 kg)   Body Composition  Body Fat %: 0 % (video visit)    No data recorded Today's Visit #: 26  Starting Date: 10/23/21   Subjective   Chief Complaint: Obesity  Alexandra [name] is here to discuss her progress with her obesity treatment plan. She is on the the Category 1 Plan and states she is following her eating plan approximately 50 % of the time. She states she is not exercising  Weight Progress Since Last Visit:  Since last office visit she has gained 1 pounds. She reports fair adherence to reduced calorie nutritional plan. She has been working on reading food labels, not skipping meals, increasing protein intake at every meal, drinking more water, making healthier choices, reducing portion sizes, and incorporating more whole foods   She has come off GLP-1 treatment because of lack of coverage.  Last office visit she was started on phentermine she has not noticed a significant impact on her hunger or satiety.  She has noticed increased energy.  Patient Reported Barriers to Progress: multiple competing priorities, work schedule, and having difficulties with GLP-1 or AOM coverage.   Orexigenic Control: Reports problems with appetite and hunger  signals.  Reports problems with satiety and satiation.  Denies problems with eating patterns and portion control.  Denies abnormal cravings. Denies feeling deprived or restricted.   Pharmacotherapy for weight management: She is currently taking Phentermine (longterm use, single agent)  without clinical response and experiencing the following side effects: Dry mouth..   Assessment and Plan   Treatment Plan For Obesity:  Recommended Dietary Goals  Alexandra [name] is currently in the action stage of change. As such, her goal is to continue weight management plan. She has agreed to: continue current plan  Behavioral Health and Counseling  We discussed the following behavioral modification strategies today: continue to work on maintaining a reduced calorie state, getting the recommended amount of protein, incorporating whole foods, making healthy choices, staying well hydrated and practicing mindfulness when eating..  Additional education and resources provided today: None  Recommended Physical Activity Goals  Alexandra [name] has been advised to work up to 150 minutes of moderate intensity aerobic activity a week and strengthening exercises 2-3 times per week for cardiovascular health, weight loss maintenance and preservation of muscle mass.   She has agreed to :  Think about enjoyable ways to increase daily physical activity and overcoming barriers to exercise and Increase physical activity in their day and reduce sedentary time (increase NEAT).  Pharmacotherapy  We discussed various medication options to help Alexandra [name] with her weight loss efforts and we both agreed to : start anti-obesity medication.  In addition to reduced calorie nutrition plan (RCNP), behavioral strategies, physical activity and phentermine, Alexandra [name] would benefit from additional pharmacotherapy to assist with hunger signals, satiety  and cravings. This will reduce obesity-related health risks by inducing weight loss, and help reduce food  consumption and adherence to [facility]) . It may also improve QOL by improving self-confidence and reduce the  setbacks associated with metabolic adaptations.  She has also come off GLP-1 therapy and is doing a good job maintaining her weight but is noticing increasing hunger signals likely due to metabolic adaptations.  After discussion of treatment options, mechanisms of action, benefits, side effects, contraindications and shared decision making she is agreeable to starting zonisamide 25 mg in the evening. Patient also made aware that medication is indicated for long-term management of obesity and the risk of weight regain following discontinuation of treatment and hence the importance of adhering to medical weight loss plan.   Associated Conditions Impacted by Obesity Treatment  Abnormal food appetite Assessment & Plan: Worsened since discontinuation of GLP-1 due to cost.  She has increased orexigenic signaling, impaired satiety and inhibitory control. This is secondary to an abnormal energy regulation system and pathological neurohormonal pathways characteristic of excess adiposity.  In addition to nutritional and behavioral strategies she benefits from pharmacotherapy.  She will continue with phentermine in the morning we will add zonisamide 25 mg in the evening.    Generalized obesity Assessment & Plan: See obesity treatment plan  Orders: -     Phentermine HCl; Take 0.5 tablets (18.75 mg total) by mouth daily before breakfast.  Dispense: 15 tablet; Refill: 0 -     Zonisamide; Take 1 capsule (25 mg total) by mouth every evening.  Dispense: 30 capsule; Refill: 0  Prediabetes Assessment & Plan: Most recent hemoglobin A1c was 5.9 from June.  She is no longer on semaglutide.  She has lost 22% of total body weight.  She will continue with medically supervised weight management plan.  Consider initiation of metformin for pharmacoprophylaxis.      Objective   Physical Exam:  Height 5\' 1"   (1.549 m), weight 153 lb (69.4 kg). Body mass index is 28.91 kg/m.  General: She is overweight, cooperative, alert, well developed, and in no acute distress. PSYCH: Has normal mood, affect and thought process.   Lungs: Normal breathing effort, no conversational dyspnea.    Diagnostic Data Reviewed:  BMET    Component Value Date/Time   NA 143 01/07/2023 1115   K 4.4 01/07/2023 1115   CL 107 (H) 01/07/2023 1115   CO2 22 01/07/2023 1115   GLUCOSE 81 01/07/2023 1115   BUN 12 01/07/2023 1115   CREATININE 0.85 01/07/2023 1115   CALCIUM 9.4 01/07/2023 1115   Lab Results  Component Value Date   HGBA1C 5.9 (H) 01/07/2023   HGBA1C 6.0 (H) 10/23/2021   Lab Results  Component Value Date   INSULIN 15.4 01/07/2023   INSULIN 17.3 10/23/2021   Lab Results  Component Value Date   TSH 1.070 10/23/2021   CBC    Component Value Date/Time   WBC 6.7 04/10/2021 1519   RBC 4.55 04/10/2021 1519   HGB 12.1 04/10/2021 1519   HCT 38.1 04/10/2021 1519   PLT 302.0 04/10/2021 1519   MCV 83.9 04/10/2021 1519   MCHC 31.8 04/10/2021 1519   RDW 14.4 04/10/2021 1519   Iron Studies No results found for: "IRON", "TIBC", "FERRITIN", "IRONPCTSAT" Lipid Panel     Component Value Date/Time   CHOL 161 05/07/2022 1101   TRIG 53 05/07/2022 1101   HDL 51 05/07/2022 1101   CHOLHDL 3.2 05/07/2022 1101   LDLCALC 99 05/07/2022 1101  Hepatic Function Panel     Component Value Date/Time   PROT 7.1 01/07/2023 1115   ALBUMIN 4.4 01/07/2023 1115   AST 16 01/07/2023 1115   ALT 15 01/07/2023 1115   ALKPHOS 74 01/07/2023 1115   BILITOT 0.3 01/07/2023 1115      Component Value Date/Time   TSH 1.070 10/23/2021 1006   Nutritional Lab Results  Component Value Date   VD25OH 39.4 01/07/2023   VD25OH 59.9 05/07/2022   VD25OH 36.5 10/23/2021    Follow-Up   No follow-ups on file.Marland Kitchen She was informed of the importance of frequent follow up visits to maximize her success with intensive lifestyle  modifications for her multiple health conditions.  Attestation Statement   Reviewed by clinician on day of visit: allergies, medications, problem list, medical history, surgical history, family history, social history, and previous encounter notes.   I discussed the assessment and treatment plan with the patient. The patient was provided an opportunity to ask questions and all were answered. The patient agreed with the plan and demonstrated an understanding of the instructions.   The patient was advised to call back or seek an in-person evaluation if the symptoms worsen or if the condition fails to improve as anticipated.   I have spent 20 minutes in the care of the patient today including: preparing to see patient (e.g. review and interpretation of tests, old notes ), obtaining and/or reviewing separately obtained history, counseling and educating the patient, ordering medications, test or procedures, and documenting clinical information in the electronic or other health care record   Worthy Rancher, MD

## 2023-10-05 NOTE — Assessment & Plan Note (Signed)
 See obesity treatment plan

## 2023-10-14 ENCOUNTER — Other Ambulatory Visit (INDEPENDENT_AMBULATORY_CARE_PROVIDER_SITE_OTHER): Payer: Self-pay | Admitting: Internal Medicine

## 2023-10-14 DIAGNOSIS — E669 Obesity, unspecified: Secondary | ICD-10-CM

## 2023-10-18 MED ORDER — PHENTERMINE HCL 37.5 MG PO TABS: 18.7500 mg | ORAL_TABLET | Freq: Every day | ORAL | 0 refills | Status: DC

## 2023-10-18 NOTE — Telephone Encounter (Signed)
 Please advise,   comment states pharm did not receive last refill, but report shows E-Prescribing Status: Receipt confirmed by pharmacy (10/05/2023  4:08 PM EDT)

## 2023-10-26 DIAGNOSIS — M205X1 Other deformities of toe(s) (acquired), right foot: Secondary | ICD-10-CM | POA: Diagnosis not present

## 2023-10-26 DIAGNOSIS — M205X2 Other deformities of toe(s) (acquired), left foot: Secondary | ICD-10-CM | POA: Diagnosis not present

## 2023-11-03 ENCOUNTER — Ambulatory Visit (INDEPENDENT_AMBULATORY_CARE_PROVIDER_SITE_OTHER): Payer: Federal, State, Local not specified - PPO | Admitting: Internal Medicine

## 2023-11-03 ENCOUNTER — Ambulatory Visit: Payer: Federal, State, Local not specified - PPO | Admitting: Internal Medicine

## 2023-11-03 ENCOUNTER — Encounter: Payer: Self-pay | Admitting: Internal Medicine

## 2023-11-03 VITALS — BP 126/70 | HR 103 | Ht 61.0 in | Wt 164.4 lb

## 2023-11-03 DIAGNOSIS — J4520 Mild intermittent asthma, uncomplicated: Secondary | ICD-10-CM | POA: Diagnosis not present

## 2023-11-03 DIAGNOSIS — J301 Allergic rhinitis due to pollen: Secondary | ICD-10-CM | POA: Diagnosis not present

## 2023-11-03 DIAGNOSIS — R053 Chronic cough: Secondary | ICD-10-CM | POA: Diagnosis not present

## 2023-11-03 DIAGNOSIS — G4733 Obstructive sleep apnea (adult) (pediatric): Secondary | ICD-10-CM | POA: Diagnosis not present

## 2023-11-03 MED ORDER — ALBUTEROL SULFATE HFA 108 (90 BASE) MCG/ACT IN AERS: 2.0000 | INHALATION_SPRAY | RESPIRATORY_TRACT | 3 refills | Status: AC | PRN

## 2023-11-03 MED ORDER — FLUTICASONE PROPIONATE HFA 44 MCG/ACT IN AERO: 2.0000 | INHALATION_SPRAY | Freq: Two times a day (BID) | RESPIRATORY_TRACT | 3 refills | Status: DC | PRN

## 2023-11-03 MED ORDER — FLUTICASONE PROPIONATE 50 MCG/ACT NA SUSP: NASAL | 11 refills | Status: AC

## 2023-11-03 NOTE — Patient Instructions (Addendum)
 It was a pleasure to see you today!  Please schedule follow up with myself in 1 year .  If my schedule is not open yet, we will contact you with a reminder closer to that time. Please call [phone] if you haven't heard from us  a month before, and always call us  sooner if issues or concerns arise. You can also send us  a message through MyChart, but but aware that this is not to be used for urgent issues and it may take up to 5-7 days to receive a reply. Please be aware that you will likely be able to view your results before I have a chance to respond to them. Please give us  5 business days to respond to any non-urgent results.   VISIT SUMMARY:  During your visit, we discussed your recent respiratory symptoms, including coughing, wheezing, and chest tightness, which have been triggered by increased pollen levels. We reviewed your current medications and made some adjustments to better manage your symptoms.  YOUR PLAN:  -MILD INTERMITTENT ASTHMA: Mild intermittent asthma is a condition where you experience occasional asthma symptoms, often triggered by factors like pollen or infections. We recommend using your Flovent inhaler during symptomatic periods and continuing to use albuterol as needed for acute symptoms.    I have refilled Flovent. Because you do not have daily symptoms, using this inhaler on an as needed basis may be sufficient.  I recommend starting to use the inhaler daily as prescribed if you have symptoms of asthma such as chest tightness, wheezing, coughing, shortness of breath. You should also start using it if you have exposure to a sick contact, worsening allergies, or any other trigger for your asthma. I recommend you keep using it even after your respiratory symptoms resolve for 3-4 days. The goal of this therapy is to prevent your symptoms from becoming a flare severe enough to require steroids like prednisone.   -ALLERGIC RHINITIS: Allergic rhinitis is nasal congestion caused  by allergens like pollen. You should continue using Flonase during high pollen periods to manage your symptoms.  Flonase - 1 spray on each side of your nose twice a day for first week, then 1 spray on each side.   Instructions for use: If you also use a saline nasal spray or rinse, use that first. Position the head with the chin slightly tucked. Use the right hand to spray into the left nostril and the right hand to spray into the left nostril.   Point the bottle away from the septum of your nose (cartilage that divides the two sides of your nose).  Hold the nostril closed on the opposite side from where you will spray Spray once and gently sniff to pull the medicine into the higher parts of your nose.  Don't sniff too hard as the medicine will drain down the back of your throat instead. Repeat with a second spray on the same side if prescribed. Repeat on the other side of your nose.

## 2023-11-03 NOTE — Progress Notes (Signed)
 [name]    [id number]    13-Jan-1966  Primary Care Physician:Sun, Charise Carwin, MD Date of Appointment: 11/03/2023 Established Patient Visit  Chief complaint:   Chief Complaint  Patient presents with   Consult    Tightness of the chest due to pollen.    Discussed the use of AI scribe software for clinical note transcription with the patient, who gave verbal consent to proceed.  HPI: Alexandra [name] is a 58 y.o. woman with mild intermittent asthma and chronic cough as well as OSA on CPAP.   Interval Updates: Former patient of Dr. Thora Lance. Here to establish care with me today.   History of Present Illness Alexandra [name] is a 58 year old female who presents with coughing, wheezing, and chest tightness.  Her respiratory symptoms have been stable over the past year until recently, when an increase in pollen levels triggered chest tightness during a walk. She did not use her Flovent inhaler at that time, choosing to rest instead. Since then, she has avoided walking in high pollen conditions.  She has a history of using a Flovent inhaler for respiratory symptoms but has not used it recently. She keeps it available for potential need but has not used it this year. She also uses albuterol as needed but has not required it this year.  She experiences nasal congestion related to pollen exposure and uses Flonase, which she finds effective. Attempts to discontinue Flonase have been unsuccessful during high pollen periods.  No history of asthma as a child, but her brother has asthma. No personal smoking history or secondhand smoke exposure, although her family raised tobacco. Her parents did not smoke.   I have reviewed the patient's family social and past medical history and updated as appropriate.   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, unspecified    Seasonal allergies    Sleep apnea    Spasm     Past Surgical History:  Procedure  Laterality Date   CESAREAN SECTION     CHOLECYSTECTOMY      Family History  Problem Relation Age of Onset   Breast cancer Mother    Depression Mother    High Cholesterol Mother    Obesity Mother    High blood pressure Father    Sleep apnea Father    Breast cancer Maternal Aunt     Social History   Occupational History   Occupation: Nurse  Tobacco Use   Smoking status: Never   Smokeless tobacco: Never  Substance and Sexual Activity   Alcohol use: Yes   Drug use: Never   Sexual activity: Not on file     Physical Exam: Blood pressure 126/70, pulse (!) 103, height 5\' 1"  (1.549 m), weight 164 lb 6.4 oz (74.6 kg), SpO2 100%.  Gen:      No acute distress ENT:  +cobblestoning, no nasal polyps, mucus membranes moist Lungs:    No increased respiratory effort, symmetric chest wall excursion, clear to auscultation bilaterally, no wheezes or crackles CV:         Regular rate and rhythm; no murmurs, rubs, or gallops.  No pedal edema   Data Reviewed: Imaging: I have personally reviewed the chest xray July 2022 - no acute process  PFTs:     Latest Ref Rng & Units 05/29/2021    9:44 AM 04/24/2021   12:08 PM  PFT Results  FVC-Pre L 2.17  P 2.19  FVC-Predicted Pre % 90  P 91   FVC-Post L 2.19  P 2.24   FVC-Predicted Post % 91  P 93   Pre FEV1/FVC % % 89  P 85   Post FEV1/FCV % % 87  P 83   FEV1-Pre L 1.94  P 1.85   FEV1-Predicted Pre % 101  P 97   FEV1-Post L 1.91  P 1.85   DLCO uncorrected ml/min/mmHg  19.83   DLCO UNC% %  109   DLCO corrected ml/min/mmHg  19.83   DLCO COR %Predicted %  109   DLVA Predicted %  132   TLC L  4.11   TLC % Predicted %  92   RV % Predicted %  111     P Preliminary result   I have personally reviewed the patient's PFTs and   Labs: Lab Results  Component Value Date   NA 143 01/07/2023   K 4.4 01/07/2023   CO2 22 01/07/2023   GLUCOSE 81 01/07/2023   BUN 12 01/07/2023   CREATININE 0.85 01/07/2023   CALCIUM 9.4 01/07/2023   EGFR  81 01/07/2023   Lab Results  Component Value Date   WBC 6.7 04/10/2021   HGB 12.1 04/10/2021   HCT 38.1 04/10/2021   MCV 83.9 04/10/2021   PLT 302.0 04/10/2021    Immunization status:  There is no immunization history on file for this patient.  External Records Personally Reviewed: pulmonlogy  Assessment and Plan Assessment & Plan Mild intermittent asthma Mild intermittent asthma with episodes triggered by pollen or infections.  - Refill Flovent inhaler for symptomatic periods. recommend intermittent inhaled steroids instead during symptoms, instructions given. - Continue albuterol as needed for acute symptoms.  Allergic rhinitis Nasal congestion due to pollen exposure, managed with Flonase. - Refill Flonase for ongoing use during high pollen periods.  Chronic cough - likely secondary to rhinitis - controlled   Return to Care: Return in about 1 year (around 11/02/2024).   Louie Rover, MD Pulmonary and Critical Care Medicine [facility] Office:[phone]

## 2023-11-04 ENCOUNTER — Other Ambulatory Visit (INDEPENDENT_AMBULATORY_CARE_PROVIDER_SITE_OTHER): Payer: Self-pay | Admitting: Internal Medicine

## 2023-11-04 DIAGNOSIS — E669 Obesity, unspecified: Secondary | ICD-10-CM

## 2023-11-09 ENCOUNTER — Other Ambulatory Visit: Payer: Self-pay | Admitting: Family Medicine

## 2023-11-09 DIAGNOSIS — Z1231 Encounter for screening mammogram for malignant neoplasm of breast: Secondary | ICD-10-CM

## 2023-11-16 DIAGNOSIS — M79604 Pain in right leg: Secondary | ICD-10-CM | POA: Diagnosis not present

## 2023-11-16 DIAGNOSIS — M79605 Pain in left leg: Secondary | ICD-10-CM | POA: Diagnosis not present

## 2023-12-03 DIAGNOSIS — G4733 Obstructive sleep apnea (adult) (pediatric): Secondary | ICD-10-CM | POA: Diagnosis not present

## 2023-12-31 ENCOUNTER — Ambulatory Visit
Admission: RE | Admit: 2023-12-31 | Discharge: 2023-12-31 | Disposition: A | Discharge status: Home / Self Care | Source: Ambulatory Visit | Attending: Family Medicine | Admitting: Family Medicine

## 2023-12-31 DIAGNOSIS — Z1231 Encounter for screening mammogram for malignant neoplasm of breast: Secondary | ICD-10-CM

## 2024-01-03 ENCOUNTER — Encounter (INDEPENDENT_AMBULATORY_CARE_PROVIDER_SITE_OTHER): Payer: Self-pay | Admitting: Internal Medicine

## 2024-01-03 ENCOUNTER — Ambulatory Visit (INDEPENDENT_AMBULATORY_CARE_PROVIDER_SITE_OTHER): Admitting: Internal Medicine

## 2024-01-03 VITALS — BP 137/70 | HR 53 | Temp 98.2°F | Ht 61.0 in | Wt 170.0 lb

## 2024-01-03 DIAGNOSIS — Z6836 Body mass index (BMI) 36.0-36.9, adult: Secondary | ICD-10-CM | POA: Diagnosis not present

## 2024-01-03 DIAGNOSIS — R638 Other symptoms and signs concerning food and fluid intake: Secondary | ICD-10-CM | POA: Diagnosis not present

## 2024-01-03 DIAGNOSIS — G4733 Obstructive sleep apnea (adult) (pediatric): Secondary | ICD-10-CM | POA: Diagnosis not present

## 2024-01-03 DIAGNOSIS — E66812 Obesity, class 2: Secondary | ICD-10-CM | POA: Diagnosis not present

## 2024-01-03 DIAGNOSIS — R7303 Prediabetes: Secondary | ICD-10-CM | POA: Diagnosis not present

## 2024-01-03 MED ORDER — TIRZEPATIDE-WEIGHT MANAGEMENT 2.5 MG/0.5ML ~~LOC~~ SOLN: 2.5000 mg | SUBCUTANEOUS | 0 refills | Status: DC

## 2024-01-03 NOTE — Assessment & Plan Note (Signed)
 Most recent hemoglobin A1c was 5.9 from June.  She is no longer on semaglutide  for pharmacoprophylaxis.  Her body fat percentage has also gone up as a result of recent weight gain she is having a difficult time getting back on track.  She is agreeable to starting Zepbound 2.5 mg once a week.  We also discussed the carb insulin  model of obesity and she will work on reducing simple and added sugars in her diet.

## 2024-01-03 NOTE — Progress Notes (Signed)
 Office: [phone]  /  Fax: [phone]  Weight Summary And Biometrics  Vitals Temp: 98.2 F (36.8 C) BP: 137/70 Pulse Rate: (!) 53 SpO2: 98 %   Anthropometric Measurements Height: 5' 1 (1.549 m) Weight: 170 lb (77.1 kg) BMI (Calculated): 32.14 Weight at Last Visit: 153 lb Weight Lost Since Last Visit: 0 lb Weight Gained Since Last Visit: 17 lb Starting Weight: 192 lb Total Weight Loss (lbs): 27 lb (12.2 kg) Peak Weight: 197 l lb   Body Composition  Body Fat %: 40.5 % Fat Mass (lbs): 69 lbs Muscle Mass (lbs): 96.2 lbs Total Body Water (lbs): 72.4 lbs Visceral Fat Rating : 10    RMR: 1930  Today's Visit #: 27  Starting Date: 10/23/21   Subjective   Chief Complaint: Obesity  Interval History  Discussed the use of AI scribe software for clinical note transcription with the patient, who gave verbal consent to proceed.  History of Present Illness   Alexandra [name] is a 58 year old female with prediabetes who presents for medical weight management.  Since discontinuing Wegovy  approximately five to six months ago due to medication cost, she has gained seventeen pounds, leading to increased hunger and decreased satiety. Her body fat percentage has increased from thirty-five percent to forty percent, and her current weight is one hundred seventy pounds, similar to her weight in July 2023. She initially weighed one hundred ninety-seven pounds when she started her weight management journey.  Nutritionally, she has been attempting to increase her protein intake but notes that phentermine  made her feel constantly hungry. A recent tooth abscess limited her diet to potato soup and bread, contributing to a five-pound weight gain in the past week. She acknowledges sensitivity to carbohydrates, particularly bread and starchy foods.  She was previously on half a tablet of phentermine , which increased her appetite. She has also tried Contrave in the past but had  difficulty with oral medications, often needing to crush them due to intolerance. She prefers injectable medications over oral ones due to poor tolerance. She is considering non-medication options for weight management, such as surgical interventions, due to challenges with maintaining weight loss through lifestyle changes alone.  She has a history of prediabetes, which was being managed with Wegovy . She is aware of the impact of carbohydrates on her insulin  levels and the associated challenges with weight management. No recent oral medication use due to intolerance.        Challenges affecting patient progress: strong hunger signals and/or impaired satiety / inhibitory control, having difficulty focusing on healthy eating, and difficulty maintaining a reduced calorie state.    Pharmacotherapy for weight management: She is currently taking Phentermine  (longterm use, single agent)  without clinical response and experiencing the following side effects: Increased appetite..  She had been on Wegovy  medication was discontinued due to cost January 2025  Assessment and Plan   Treatment Plan For Obesity:  Recommended Dietary Goals  Alexandra [name] is currently in the action stage of change. As such, her goal is to continue weight management plan. She has agreed to: continue to work on implementation of reduced calorie nutrition plan (RCNP)  Behavioral Health and Counseling  We discussed the following behavioral modification strategies today: continue to work on maintaining a reduced calorie state, getting the recommended amount of protein, incorporating whole foods, making healthy choices, staying well hydrated and practicing mindfulness when eating. and avoid all or none mindset.  Additional education and resources provided today: None  Recommended Physical Activity  Goals  Alexandra [name] has been advised to work up to 150 minutes of moderate intensity aerobic activity a week and strengthening exercises 2-3  times per week for cardiovascular health, weight loss maintenance and preservation of muscle mass.   She has agreed to :  continue to gradually increase the amount and intensity of exercise routine  Pharmacotherapy  We discussed various medication options to help Alexandra [name] with her weight loss efforts and we both agreed to : Start anti-obesity medication.  In addition to reduced calorie nutrition plan (RCNP), behavioral strategies and physical activity, Alexandra [name] would benefit from pharmacotherapy to assist with hunger signals, satiety and cravings. This will reduce obesity-related health risks by inducing weight loss, and help reduce food consumption and adherence to Alexandra [name]) . It may also improve QOL by improving self-confidence and reduce the  setbacks associated with metabolic adaptations.  After discussion of treatment options, mechanisms of action, benefits, side effects, contraindications and shared decision making she is agreeable to starting Zepbound 2.5 mg once a week through [employer] direct. Patient also made aware that medication is indicated for long-term management of obesity and the risk of weight regain following discontinuation of treatment and hence the importance of adhering to medical weight loss plan.  We demonstrated use of device and patient using teach back method was able to demonstrate proper technique.  Associated Conditions Impacted by Obesity Treatment  Prediabetes Assessment & Plan: Most recent hemoglobin A1c was 5.9 from June.  She is no longer on semaglutide  for pharmacoprophylaxis.  Her body fat percentage has also gone up as a result of recent weight gain she is having a difficult time getting back on track.  She is agreeable to starting Zepbound 2.5 mg once a week.  We also discussed the carb insulin  model of obesity and she will work on reducing simple and added sugars in her diet.  Orders: -     Tirzepatide-Weight Management; Inject 2.5 mg into the skin once a week.   Dispense: 2 mL; Refill: 0  Class 2 severe obesity with serious comorbidity and body mass index (BMI) of 36.0 to 36.9 in adult, unspecified obesity type [facility]) Assessment & Plan: She has regained 17 pounds since discontinuing Wegovy  due to cost, with an increase in body fat percentage from 35% to 40%. She has been off Wegovy  for 5-6 months, experiencing increased hunger and difficulty maintaining weight. Her prediabetes and insulin  resistance may be exacerbated by increased carbohydrate intake. Discussed challenges of weight regain post-GLP-1 receptor agonist discontinuation and emphasized managing appetite and dietary intake. Considered Qsymia  as a cost-effective alternative, but she prefers Zepbound, an injectable GLP-1 receptor agonist, for short-term use.  Discussed surgical options as long-term solutions if medication is not preferred. - Prescribe Zepbound 2.5 mg for one month to manage weight and appetite. - Advise her to visit the Zepbound website for discount savings card information. - Encourage dietary tracking and journaling, focusing on 20-25 grams of fiber and 90-120 grams of protein daily. - Advise reducing carbohydrate intake to manage insulin  levels and hunger. - Discuss potential surgical options, including gastric sleeve and endoscopic gastroplasty, as long-term solutions if medication is not preferred.  Orders: -     Tirzepatide-Weight Management; Inject 2.5 mg into the skin once a week.  Dispense: 2 mL; Refill: 0  Abnormal food appetite Assessment & Plan: Worsened since discontinuation of GLP-1 due to cost.  She has increased orexigenic signaling, impaired satiety and inhibitory control. This is secondary to an abnormal energy regulation system and pathological  neurohormonal pathways characteristic of excess adiposity.  In addition to nutritional and behavioral strategies she benefits from pharmacotherapy.  She felt that phentermine  increase her appetite and therefore discontinued  medication.  She will be started on Zepbound 2.5 mg once a week          Objective   Physical Exam:  Blood pressure 137/70, pulse (!) 53, temperature 98.2 F (36.8 C), height 5' 1 (1.549 m), weight 170 lb (77.1 kg), SpO2 98%. Body mass index is 32.12 kg/m.  General: She is overweight, cooperative, alert, well developed, and in no acute distress. PSYCH: Has normal mood, affect and thought process.   HEENT: EOMI, sclerae are anicteric. Lungs: Normal breathing effort, no conversational dyspnea. Extremities: No edema.  Neurologic: No gross sensory or motor deficits. No tremors or fasciculations noted.    Diagnostic Data Reviewed:  BMET    Component Value Date/Time   NA 143 01/07/2023 1115   K 4.4 01/07/2023 1115   CL 107 (H) 01/07/2023 1115   CO2 22 01/07/2023 1115   GLUCOSE 81 01/07/2023 1115   BUN 12 01/07/2023 1115   CREATININE 0.85 01/07/2023 1115   CALCIUM 9.4 01/07/2023 1115   Lab Results  Component Value Date   HGBA1C 5.9 (H) 01/07/2023   HGBA1C 6.0 (H) 10/23/2021   Lab Results  Component Value Date   INSULIN  15.4 01/07/2023   INSULIN  17.3 10/23/2021   Lab Results  Component Value Date   TSH 1.070 10/23/2021   CBC    Component Value Date/Time   WBC 6.7 04/10/2021 1519   RBC 4.55 04/10/2021 1519   HGB 12.1 04/10/2021 1519   HCT 38.1 04/10/2021 1519   PLT 302.0 04/10/2021 1519   MCV 83.9 04/10/2021 1519   MCHC 31.8 04/10/2021 1519   RDW 14.4 04/10/2021 1519   Iron Studies No results found for: IRON, TIBC, FERRITIN, IRONPCTSAT Lipid Panel     Component Value Date/Time   CHOL 161 05/07/2022 1101   TRIG 53 05/07/2022 1101   HDL 51 05/07/2022 1101   CHOLHDL 3.2 05/07/2022 1101   LDLCALC 99 05/07/2022 1101   Hepatic Function Panel     Component Value Date/Time   PROT 7.1 01/07/2023 1115   ALBUMIN 4.4 01/07/2023 1115   AST 16 01/07/2023 1115   ALT 15 01/07/2023 1115   ALKPHOS 74 01/07/2023 1115   BILITOT 0.3 01/07/2023 1115       Component Value Date/Time   TSH 1.070 10/23/2021 1006   Nutritional Lab Results  Component Value Date   VD25OH 39.4 01/07/2023   VD25OH 59.9 05/07/2022   VD25OH 36.5 10/23/2021    Medications: Outpatient Encounter Medications as of 01/03/2024  Medication Sig   albuterol  (VENTOLIN  HFA) 108 (90 Base) MCG/ACT inhaler Inhale 2 puffs into the lungs every 4 (four) hours as needed for wheezing or shortness of breath.   Ayr Saline Nasal No-Drip GEL Place 1 spray into the nose every 8 (eight) hours as needed.   clobetasol  (OLUX ) 0.05 % topical foam Apply topically 2 (two) times daily.   fluticasone  (FLONASE ) 50 MCG/ACT nasal spray SHAKE LIQUID AND USE 1 SPRAY IN EACH NOSTRIL DAILY   fluticasone  (FLOVENT  HFA) 44 MCG/ACT inhaler Inhale 2 puffs into the lungs 2 (two) times daily as needed (asthma symptoms).   tacrolimus  (PROTOPIC ) 0.1 % ointment Apply topically at bedtime.   tirzepatide (ZEPBOUND) 2.5 MG/0.5ML injection vial Inject 2.5 mg into the skin once a week.   Vitamin D , Ergocalciferol , (DRISDOL ) 1.25 MG (50000 UNIT)  CAPS capsule Take 1 capsule (50,000 Units total) by mouth every 14 (fourteen) days.   zonisamide  (ZONEGRAN ) 25 MG capsule Take 1 capsule (25 mg total) by mouth every evening.   phentermine  (ADIPEX-P ) 37.5 MG tablet Take 0.5 tablets (18.75 mg total) by mouth daily before breakfast. (Patient not taking: Reported on 01/03/2024)   No facility-administered encounter medications on file as of 01/03/2024.     Follow-Up   Return in about 4 weeks (around 01/31/2024) for For Weight Mangement with Dr. Allie Area .Aaron Aas She was informed of the importance of frequent follow up visits to maximize her success with intensive lifestyle modifications for her multiple health conditions.  Attestation Statement   Reviewed by clinician on day of visit: allergies, medications, problem list, medical history, surgical history, family history, social history, and previous encounter notes.     Ladd Picker, MD

## 2024-01-03 NOTE — Assessment & Plan Note (Signed)
 She has regained 17 pounds since discontinuing Wegovy  due to cost, with an increase in body fat percentage from 35% to 40%. She has been off Wegovy  for 5-6 months, experiencing increased hunger and difficulty maintaining weight. Her prediabetes and insulin  resistance may be exacerbated by increased carbohydrate intake. Discussed challenges of weight regain post-GLP-1 receptor agonist discontinuation and emphasized managing appetite and dietary intake. Considered Qsymia  as a cost-effective alternative, but she prefers Zepbound, an injectable GLP-1 receptor agonist, for short-term use.  Discussed surgical options as long-term solutions if medication is not preferred. - Prescribe Zepbound 2.5 mg for one month to manage weight and appetite. - Advise her to visit the Zepbound website for discount savings card information. - Encourage dietary tracking and journaling, focusing on 20-25 grams of fiber and 90-120 grams of protein daily. - Advise reducing carbohydrate intake to manage insulin  levels and hunger. - Discuss potential surgical options, including gastric sleeve and endoscopic gastroplasty, as long-term solutions if medication is not preferred.

## 2024-01-03 NOTE — Assessment & Plan Note (Signed)
 Worsened since discontinuation of GLP-1 due to cost.  She has increased orexigenic signaling, impaired satiety and inhibitory control. This is secondary to an abnormal energy regulation system and pathological neurohormonal pathways characteristic of excess adiposity.  In addition to nutritional and behavioral strategies she benefits from pharmacotherapy.  She felt that phentermine  increase her appetite and therefore discontinued medication.  She will be started on Zepbound 2.5 mg once a week

## 2024-01-05 DIAGNOSIS — G4733 Obstructive sleep apnea (adult) (pediatric): Secondary | ICD-10-CM | POA: Diagnosis not present

## 2024-01-06 ENCOUNTER — Other Ambulatory Visit: Payer: Self-pay | Admitting: Family Medicine

## 2024-01-06 DIAGNOSIS — R928 Other abnormal and inconclusive findings on diagnostic imaging of breast: Secondary | ICD-10-CM

## 2024-01-12 ENCOUNTER — Other Ambulatory Visit

## 2024-01-12 ENCOUNTER — Encounter

## 2024-01-24 ENCOUNTER — Ambulatory Visit
Admission: RE | Admit: 2024-01-24 | Discharge: 2024-01-24 | Disposition: A | Discharge status: Home / Self Care | Source: Ambulatory Visit | Attending: Family Medicine | Admitting: Family Medicine

## 2024-01-24 ENCOUNTER — Other Ambulatory Visit: Payer: Self-pay | Admitting: Family Medicine

## 2024-01-24 DIAGNOSIS — R928 Other abnormal and inconclusive findings on diagnostic imaging of breast: Secondary | ICD-10-CM | POA: Diagnosis not present

## 2024-01-24 DIAGNOSIS — N6489 Other specified disorders of breast: Secondary | ICD-10-CM

## 2024-01-24 DIAGNOSIS — N6012 Diffuse cystic mastopathy of left breast: Secondary | ICD-10-CM | POA: Diagnosis not present

## 2024-01-24 DIAGNOSIS — D242 Benign neoplasm of left breast: Secondary | ICD-10-CM | POA: Diagnosis not present

## 2024-01-24 DIAGNOSIS — L2084 Intrinsic (allergic) eczema: Secondary | ICD-10-CM | POA: Diagnosis not present

## 2024-01-24 DIAGNOSIS — Z79899 Other long term (current) drug therapy: Secondary | ICD-10-CM | POA: Diagnosis not present

## 2024-01-24 DIAGNOSIS — L639 Alopecia areata, unspecified: Secondary | ICD-10-CM | POA: Diagnosis not present

## 2024-01-24 DIAGNOSIS — L669 Cicatricial alopecia, unspecified: Secondary | ICD-10-CM | POA: Diagnosis not present

## 2024-01-24 HISTORY — PX: BREAST BIOPSY: SHX20

## 2024-01-25 LAB — SURGICAL PATHOLOGY

## 2024-02-02 DIAGNOSIS — G4733 Obstructive sleep apnea (adult) (pediatric): Secondary | ICD-10-CM | POA: Diagnosis not present

## 2024-02-07 ENCOUNTER — Encounter (INDEPENDENT_AMBULATORY_CARE_PROVIDER_SITE_OTHER): Payer: Self-pay | Admitting: Internal Medicine

## 2024-02-07 ENCOUNTER — Ambulatory Visit (INDEPENDENT_AMBULATORY_CARE_PROVIDER_SITE_OTHER): Admitting: Internal Medicine

## 2024-02-07 VITALS — BP 113/71 | HR 75 | Temp 98.1°F | Ht 61.0 in | Wt 167.0 lb

## 2024-02-07 DIAGNOSIS — E7800 Pure hypercholesterolemia, unspecified: Secondary | ICD-10-CM

## 2024-02-07 DIAGNOSIS — G4733 Obstructive sleep apnea (adult) (pediatric): Secondary | ICD-10-CM

## 2024-02-07 DIAGNOSIS — R638 Other symptoms and signs concerning food and fluid intake: Secondary | ICD-10-CM | POA: Diagnosis not present

## 2024-02-07 DIAGNOSIS — R7303 Prediabetes: Secondary | ICD-10-CM | POA: Diagnosis not present

## 2024-02-07 DIAGNOSIS — Z6831 Body mass index (BMI) 31.0-31.9, adult: Secondary | ICD-10-CM

## 2024-02-07 DIAGNOSIS — R400 Somnolence: Secondary | ICD-10-CM | POA: Diagnosis not present

## 2024-02-07 DIAGNOSIS — E669 Obesity, unspecified: Secondary | ICD-10-CM

## 2024-02-07 DIAGNOSIS — E66812 Obesity, class 2: Secondary | ICD-10-CM

## 2024-02-07 MED ORDER — TIRZEPATIDE-WEIGHT MANAGEMENT 5 MG/0.5ML ~~LOC~~ SOLN: 5.0000 mg | SUBCUTANEOUS | 0 refills | Status: DC

## 2024-02-07 NOTE — Progress Notes (Unsigned)
 Office: [phone]  /  Fax: [phone]  Weight Summary and Body Composition Analysis (BIA)  Vitals Temp: 98.1 F (36.7 C) BP: 113/71 Pulse Rate: 75 SpO2: 98 %   Anthropometric Measurements Height: 5' 1 (1.549 m) Weight: 167 lb (75.8 kg) BMI (Calculated): 31.57 Weight at Last Visit: 170 lb Weight Lost Since Last Visit: 3 lb Weight Gained Since Last Visit: 0 lb Starting Weight: 190 lb Total Weight Loss (lbs): 25 lb (11.3 kg) Peak Weight: 197 lb   Body Composition  Body Fat %: 39 % Fat Mass (lbs): 65.4 lbs Muscle Mass (lbs): 97.2 lbs Total Body Water (lbs): 70.8 lbs Visceral Fat Rating : 10    RMR: 1930  Today's Visit #: 28  Starting Date: 10/23/21   Subjective   Chief Complaint: Obesity  Interval History ***  Challenges affecting patient progress: {EMOBESITYBARRIERS:28841::none}.    Pharmacotherapy for weight management: She is currently taking {EMPharmaco:28845}.   Assessment and Plan   Treatment Plan For Obesity:  Recommended Dietary Goals  Ersel is currently in the action stage of change. As such, her goal is to continue weight management plan. She has agreed to: {EMWTLOSSPLAN:29297::continue current plan}  Behavioral Health and Counseling  We discussed the following behavioral modification strategies today: {EMWMwtlossstrategies:28914::continue to work on maintaining a reduced calorie state, getting the recommended amount of protein, incorporating whole foods, making healthy choices, staying well hydrated and practicing mindfulness when eating.}.  Additional education and resources provided today: {EMadditionalresources:29169::None}  Recommended Physical Activity Goals  Emiyah has been advised to work up to 150 minutes of moderate intensity aerobic activity a week and strengthening exercises 2-3 times per week for cardiovascular health, weight loss maintenance and preservation of muscle mass.   She has agreed to :   {EMEXERCISE:28847::Think about enjoyable ways to increase daily physical activity and overcoming barriers to exercise,Increase physical activity in their day and reduce sedentary time (increase NEAT).}  Medical Interventions and Pharmacotherapy  We discussed various medication options to help Nomie with her weight loss efforts and we both agreed to : {EMagreedrx:29170}  Associated Conditions Impacted by Obesity Treatment  Assessment & Plan Prediabetes  Class 2 severe obesity with serious comorbidity and body mass index (BMI) of 36.0 to 36.9 in adult, unspecified obesity type (HCC)     ***  Objective   Physical Exam:  Blood pressure 113/71, pulse 75, temperature 98.1 F (36.7 C), height 5' 1 (1.549 m), weight 167 lb (75.8 kg), SpO2 98%. Body mass index is 31.55 kg/m.  General: She is overweight, cooperative, alert, well developed, and in no acute distress. PSYCH: Has normal mood, affect and thought process.   HEENT: EOMI, sclerae are anicteric. Lungs: Normal breathing effort, no conversational dyspnea. Extremities: No edema.  Neurologic: No gross sensory or motor deficits. No tremors or fasciculations noted.    Diagnostic Data Reviewed:  BMET    Component Value Date/Time   NA 143 01/07/2023 1115   K 4.4 01/07/2023 1115   CL 107 (H) 01/07/2023 1115   CO2 22 01/07/2023 1115   GLUCOSE 81 01/07/2023 1115   BUN 12 01/07/2023 1115   CREATININE 0.85 01/07/2023 1115   CALCIUM 9.4 01/07/2023 1115   Lab Results  Component Value Date   HGBA1C 5.9 (H) 01/07/2023   HGBA1C 6.0 (H) 10/23/2021   Lab Results  Component Value Date   INSULIN  15.4 01/07/2023   INSULIN  17.3 10/23/2021   Lab Results  Component Value Date   TSH 1.070 10/23/2021   CBC  Component Value Date/Time   WBC 6.7 04/10/2021 1519   RBC 4.55 04/10/2021 1519   HGB 12.1 04/10/2021 1519   HCT 38.1 04/10/2021 1519   PLT 302.0 04/10/2021 1519   MCV 83.9 04/10/2021 1519   MCHC 31.8 04/10/2021  1519   RDW 14.4 04/10/2021 1519   Iron Studies No results found for: IRON, TIBC, FERRITIN, IRONPCTSAT Lipid Panel     Component Value Date/Time   CHOL 161 05/07/2022 1101   TRIG 53 05/07/2022 1101   HDL 51 05/07/2022 1101   CHOLHDL 3.2 05/07/2022 1101   LDLCALC 99 05/07/2022 1101   Hepatic Function Panel     Component Value Date/Time   PROT 7.1 01/07/2023 1115   ALBUMIN 4.4 01/07/2023 1115   AST 16 01/07/2023 1115   ALT 15 01/07/2023 1115   ALKPHOS 74 01/07/2023 1115   BILITOT 0.3 01/07/2023 1115      Component Value Date/Time   TSH 1.070 10/23/2021 1006   Nutritional Lab Results  Component Value Date   VD25OH 39.4 01/07/2023   VD25OH 59.9 05/07/2022   VD25OH 36.5 10/23/2021    Medications: Outpatient Encounter Medications as of 02/07/2024  Medication Sig   albuterol  (VENTOLIN  HFA) 108 (90 Base) MCG/ACT inhaler Inhale 2 puffs into the lungs every 4 (four) hours as needed for wheezing or shortness of breath.   [place]yr Saline Nasal No-Drip GEL Place 1 spray into the nose every 8 (eight) hours as needed.   clobetasol  (OLUX ) 0.05 % topical foam Apply topically 2 (two) times daily.   fluticasone  (FLONASE ) 50 MCG/ACT nasal spray SHAKE LIQUID AND USE 1 SPRAY IN EACH NOSTRIL DAILY   fluticasone  (FLOVENT  HFA) 44 MCG/ACT inhaler Inhale 2 puffs into the lungs 2 (two) times daily as needed (asthma symptoms).   tacrolimus  (PROTOPIC ) 0.1 % ointment Apply topically at bedtime.   tirzepatide  (ZEPBOUND ) 2.5 MG/0.5ML injection vial Inject 2.5 mg into the skin once a week.   Vitamin D , Ergocalciferol , (DRISDOL ) 1.25 MG (50000 UNIT) CAPS capsule Take 1 capsule (50,000 Units total) by mouth every 14 (fourteen) days.   zonisamide  (ZONEGRAN ) 25 MG capsule Take 1 capsule (25 mg total) by mouth every evening.   phentermine  (ADIPEX-P ) 37.5 MG tablet Take 0.5 tablets (18.75 mg total) by mouth daily before breakfast. (Patient not taking: Reported on 01/03/2024)   No facility-administered  encounter medications on file as of 02/07/2024.     Follow-Up   No follow-ups on file.SABRA She was informed of the importance of frequent follow up visits to maximize her success with intensive lifestyle modifications for her multiple health conditions.  Attestation Statement   Reviewed by clinician on day of visit: allergies, medications, problem list, medical history, surgical history, family history, social history, and previous encounter notes.     Lucas Parker, MD

## 2024-02-08 DIAGNOSIS — Z6831 Body mass index (BMI) 31.0-31.9, adult: Secondary | ICD-10-CM | POA: Insufficient documentation

## 2024-02-08 NOTE — Assessment & Plan Note (Signed)
 Most recent hemoglobin A1c was 5.9 from June.  She is no longer on semaglutide  for pharmacoprophylaxis.  She is now on Zepbound  which will be increased to 5 mg once a week for pharmacoprophylaxis

## 2024-02-08 NOTE — Progress Notes (Signed)
 Office: [phone]  /  Fax: [phone]  Weight Summary And Body Composition Analysis (BIA)  Vitals Temp: 98.1 F (36.7 C) BP: 113/71 Pulse Rate: 75 SpO2: 98 %   Anthropometric Measurements Height: 5' 1 (1.549 m) Weight: 167 lb (75.8 kg) BMI (Calculated): 31.57 Weight at Last Visit: 170 lb Weight Lost Since Last Visit: 3 lb Weight Gained Since Last Visit: 0 lb Starting Weight: 190 lb Total Weight Loss (lbs): 25 lb (11.3 kg) Peak Weight: 197 lb   Body Composition  Body Fat %: 39 % Fat Mass (lbs): 65.4 lbs Muscle Mass (lbs): 97.2 lbs Total Body Water (lbs): 70.8 lbs Visceral Fat Rating : 10    RMR: 1930  Today's Visit #: 28  Starting Date: 10/23/21   Subjective   Chief Complaint: Obesity  Alexandra [name] is here to discuss her progress with her obesity treatment plan. She is following the Category 1 plan - 1000 kcal per day and states she is following her eating plan approximately 50-60% of the time. She states she is exercising 30 minutes 4 times per week..  Weight Progress Since Last Visit:  Since last office visit she has lost 3 pounds. She reports good adherence to reduced calorie nutritional plan. She has been working on reading food labels, not skipping meals, increasing protein intake at every meal, drinking more water, making healthier choices, reducing portion sizes, and incorporating more whole foods   She was previously on Wegovy  and due to loss of coverage she will started on Zepbound  2.5 mg once a week.  The cost of the medication is an issue.  Nutritional 24 HR Recall: Intake consistent with prescribed nutritional plan  Challenges affecting patient progress: multiple competing priorities and having difficulties with GLP-1 or AOM coverage.   Orexigenic Control: Reports improved problems with appetite and hunger signals.  Reports improved problems with satiety and satiation.  Denies problems with eating patterns and portion control.  Reports  abnormal cravings. Denies feeling deprived or restricted.   Pharmacotherapy for weight management: She is currently taking Zepbound  with adequate clinical response  and without side effects..   Assessment and Plan   Treatment Plan For Obesity:  Recommended Dietary Goals  Alexandra [name] is currently in the action stage of change. As such, her goal is to continue weight management plan. She has agreed to: continue current plan  Behavioral Health and Counseling  We discussed the following behavioral modification strategies today: continue to work on maintaining a reduced calorie state, getting the recommended amount of protein, incorporating whole foods, making healthy choices, staying well hydrated and practicing mindfulness when eating..  Additional education and resources provided today: None  Recommended Physical Activity Goals  Alexandra [name] has been advised to work up to 150 minutes of moderate intensity aerobic activity a week and strengthening exercises 2-3 times per week for cardiovascular health, weight loss maintenance and preservation of muscle mass.   She has agreed to :  continue to gradually increase the amount and intensity of exercise routine  Pharmacotherapy and Medical Interventions  Increase Zepbound  to 5 mg once a week via [employer] direct  Associated Conditions Impacted by Obesity Treatment  Assessment & Plan Prediabetes Most recent hemoglobin A1c was 5.9 from June.  She is no longer on semaglutide  for pharmacoprophylaxis.  She is now on Zepbound  which will be increased to 5 mg once a week for pharmacoprophylaxis OSA (obstructive sleep apnea) Patient will bring in her previous sleep study results to determine degree of sleep apnea.  She is currently  on PAP therapy Generalized obesity Start BMI 36.28 She has lost 3 pounds this brings her to a total of 25 pounds she has started to lose weight on Zepbound  had some weight regain after having to come off Wegovy .  She benefits from  GLP-1 due to strong orixegenic signaling.  Continue structured nutrition plan and increasing volume of physical activity. BMI 31.0-31.9,adult  Abnormal food appetite Improving on Zepbound  previously on Wegovy .  She has increased orexigenic signaling, impaired satiety and inhibitory control. This is secondary to an abnormal energy regulation system and pathological neurohormonal pathways characteristic of excess adiposity.  In addition to nutritional and behavioral strategies she benefits from pharmacotherapy.  She felt that phentermine  increase her appetite and therefore discontinued medication.  Will increase Zepbound  to 5 mg once a week.     Objective   Physical Exam:  Blood pressure 113/71, pulse 75, temperature 98.1 F (36.7 C), height 5' 1 (1.549 m), weight 167 lb (75.8 kg), SpO2 98%. Body mass index is 31.55 kg/m.  General: She is overweight, cooperative, alert, well developed, and in no acute distress. PSYCH: Has normal mood, affect and thought process.   HEENT: EOMI, sclerae are anicteric. Lungs: Normal breathing effort, no conversational dyspnea. Extremities: No edema.  Neurologic: No gross sensory or motor deficits. No tremors or fasciculations noted.    Diagnostic Data Reviewed:  BMET    Component Value Date/Time   NA 143 01/07/2023 1115   K 4.4 01/07/2023 1115   CL 107 (H) 01/07/2023 1115   CO2 22 01/07/2023 1115   GLUCOSE 81 01/07/2023 1115   BUN 12 01/07/2023 1115   CREATININE 0.85 01/07/2023 1115   CALCIUM 9.4 01/07/2023 1115   Lab Results  Component Value Date   HGBA1C 5.9 (H) 01/07/2023   HGBA1C 6.0 (H) 10/23/2021   Lab Results  Component Value Date   INSULIN  15.4 01/07/2023   INSULIN  17.3 10/23/2021   Lab Results  Component Value Date   TSH 1.070 10/23/2021   CBC    Component Value Date/Time   WBC 6.7 04/10/2021 1519   RBC 4.55 04/10/2021 1519   HGB 12.1 04/10/2021 1519   HCT 38.1 04/10/2021 1519   PLT 302.0 04/10/2021 1519   MCV 83.9  04/10/2021 1519   MCHC 31.8 04/10/2021 1519   RDW 14.4 04/10/2021 1519   Iron Studies No results found for: IRON, TIBC, FERRITIN, IRONPCTSAT Lipid Panel     Component Value Date/Time   CHOL 161 05/07/2022 1101   TRIG 53 05/07/2022 1101   HDL 51 05/07/2022 1101   CHOLHDL 3.2 05/07/2022 1101   LDLCALC 99 05/07/2022 1101   Hepatic Function Panel     Component Value Date/Time   PROT 7.1 01/07/2023 1115   ALBUMIN 4.4 01/07/2023 1115   AST 16 01/07/2023 1115   ALT 15 01/07/2023 1115   ALKPHOS 74 01/07/2023 1115   BILITOT 0.3 01/07/2023 1115      Component Value Date/Time   TSH 1.070 10/23/2021 1006   Nutritional Lab Results  Component Value Date   VD25OH 39.4 01/07/2023   VD25OH 59.9 05/07/2022   VD25OH 36.5 10/23/2021    Medications: Outpatient Encounter Medications as of 02/07/2024  Medication Sig   albuterol  (VENTOLIN  HFA) 108 (90 Base) MCG/ACT inhaler Inhale 2 puffs into the lungs every 4 (four) hours as needed for wheezing or shortness of breath.   [place] Saline Nasal No-Drip GEL Place 1 spray into the nose every 8 (eight) hours as needed.   clobetasol  (OLUX ) 0.05 %  topical foam Apply topically 2 (two) times daily.   fluticasone  (FLONASE ) 50 MCG/ACT nasal spray SHAKE LIQUID AND USE 1 SPRAY IN EACH NOSTRIL DAILY   fluticasone  (FLOVENT  HFA) 44 MCG/ACT inhaler Inhale 2 puffs into the lungs 2 (two) times daily as needed (asthma symptoms).   tacrolimus  (PROTOPIC ) 0.1 % ointment Apply topically at bedtime.   tirzepatide  5 MG/0.5ML injection vial Inject 5 mg into the skin once a week.   Vitamin D , Ergocalciferol , (DRISDOL ) 1.25 MG (50000 UNIT) CAPS capsule Take 1 capsule (50,000 Units total) by mouth every 14 (fourteen) days.   [DISCONTINUED] tirzepatide  (ZEPBOUND ) 2.5 MG/0.5ML injection vial Inject 2.5 mg into the skin once a week.   [DISCONTINUED] zonisamide  (ZONEGRAN ) 25 MG capsule Take 1 capsule (25 mg total) by mouth every evening.   [DISCONTINUED] phentermine   (ADIPEX-P ) 37.5 MG tablet Take 0.5 tablets (18.75 mg total) by mouth daily before breakfast. (Patient not taking: Reported on 01/03/2024)   No facility-administered encounter medications on file as of 02/07/2024.     Follow-Up   Return in about 4 weeks (around 03/06/2024) for For Weight Mangement with Dr. Francyne.SABRA She was informed of the importance of frequent follow up visits to maximize her success with intensive lifestyle modifications for her multiple health conditions.  Attestation Statement   Reviewed by clinician on day of visit: allergies, medications, problem list, medical history, surgical history, family history, social history, and previous encounter notes.     Lucas Francyne, MD

## 2024-02-08 NOTE — Assessment & Plan Note (Signed)
 She has lost 3 pounds this brings her to a total of 25 pounds she has started to lose weight on Zepbound  had some weight regain after having to come off Wegovy .  She benefits from GLP-1 due to strong orixegenic signaling.  Continue structured nutrition plan and increasing volume of physical activity.

## 2024-02-08 NOTE — Assessment & Plan Note (Signed)
 Improving on Zepbound  previously on Wegovy .  She has increased orexigenic signaling, impaired satiety and inhibitory control. This is secondary to an abnormal energy regulation system and pathological neurohormonal pathways characteristic of excess adiposity.  In addition to nutritional and behavioral strategies she benefits from pharmacotherapy.  She felt that phentermine  increase her appetite and therefore discontinued medication.  Will increase Zepbound  to 5 mg once a week.

## 2024-02-08 NOTE — Assessment & Plan Note (Signed)
 Patient will bring in her previous sleep study results to determine degree of sleep apnea.  She is currently on PAP therapy

## 2024-02-11 ENCOUNTER — Encounter (INDEPENDENT_AMBULATORY_CARE_PROVIDER_SITE_OTHER): Payer: Self-pay | Admitting: Internal Medicine

## 2024-02-11 ENCOUNTER — Other Ambulatory Visit: Payer: Self-pay | Admitting: Medical Genetics

## 2024-02-15 ENCOUNTER — Other Ambulatory Visit (INDEPENDENT_AMBULATORY_CARE_PROVIDER_SITE_OTHER): Payer: Self-pay

## 2024-02-15 MED ORDER — WEGOVY 0.5 MG/0.5ML ~~LOC~~ SOAJ: 0.5000 mg | SUBCUTANEOUS | 0 refills | Status: DC

## 2024-03-01 MED ORDER — WEGOVY 1 MG/0.5ML ~~LOC~~ SOAJ: 1.0000 mg | SUBCUTANEOUS | 0 refills | Status: DC

## 2024-03-03 ENCOUNTER — Other Ambulatory Visit (HOSPITAL_COMMUNITY)
Admission: RE | Admit: 2024-03-03 | Discharge: 2024-03-03 | Disposition: A | Discharge status: Home / Self Care | Source: Ambulatory Visit | Attending: Obstetrics and Gynecology | Admitting: Obstetrics and Gynecology

## 2024-03-03 ENCOUNTER — Other Ambulatory Visit: Payer: Self-pay | Admitting: Obstetrics and Gynecology

## 2024-03-03 DIAGNOSIS — R896 Abnormal cytological findings in specimens from other organs, systems and tissues: Secondary | ICD-10-CM | POA: Insufficient documentation

## 2024-03-03 DIAGNOSIS — Z1151 Encounter for screening for human papillomavirus (HPV): Secondary | ICD-10-CM | POA: Diagnosis not present

## 2024-03-03 DIAGNOSIS — Z01419 Encounter for gynecological examination (general) (routine) without abnormal findings: Secondary | ICD-10-CM | POA: Diagnosis not present

## 2024-03-04 DIAGNOSIS — G4733 Obstructive sleep apnea (adult) (pediatric): Secondary | ICD-10-CM | POA: Diagnosis not present

## 2024-03-06 ENCOUNTER — Ambulatory Visit (INDEPENDENT_AMBULATORY_CARE_PROVIDER_SITE_OTHER): Admitting: Internal Medicine

## 2024-03-07 ENCOUNTER — Ambulatory Visit (INDEPENDENT_AMBULATORY_CARE_PROVIDER_SITE_OTHER): Admitting: Internal Medicine

## 2024-03-07 ENCOUNTER — Encounter (INDEPENDENT_AMBULATORY_CARE_PROVIDER_SITE_OTHER): Payer: Self-pay | Admitting: Internal Medicine

## 2024-03-07 VITALS — BP 106/70 | HR 69 | Temp 98.0°F | Ht 61.0 in | Wt 163.0 lb

## 2024-03-07 DIAGNOSIS — R7303 Prediabetes: Secondary | ICD-10-CM | POA: Diagnosis not present

## 2024-03-07 DIAGNOSIS — K5900 Constipation, unspecified: Secondary | ICD-10-CM | POA: Diagnosis not present

## 2024-03-07 DIAGNOSIS — G4733 Obstructive sleep apnea (adult) (pediatric): Secondary | ICD-10-CM

## 2024-03-07 DIAGNOSIS — E669 Obesity, unspecified: Secondary | ICD-10-CM | POA: Diagnosis not present

## 2024-03-07 DIAGNOSIS — Z6831 Body mass index (BMI) 31.0-31.9, adult: Secondary | ICD-10-CM | POA: Diagnosis not present

## 2024-03-07 DIAGNOSIS — K649 Unspecified hemorrhoids: Secondary | ICD-10-CM | POA: Diagnosis not present

## 2024-03-07 MED ORDER — SEMAGLUTIDE-WEIGHT MANAGEMENT 1.7 MG/0.75ML ~~LOC~~ SOAJ: 1.7000 mg | SUBCUTANEOUS | 0 refills | Status: DC

## 2024-03-08 NOTE — Assessment & Plan Note (Signed)
 I reviewed sleep study from 2018 she has severe sleep apnea with an REI of 40/h she had moderate desaturations with a minimum saturation of 82% time below 89% was 10 minutes.  She is currently on PAP therapy.  Losing 50% of body weight may reduce AHI

## 2024-03-08 NOTE — Assessment & Plan Note (Signed)
 Most recent hemoglobin A1c was 5.9 from June.  She has had a lapse in treatment because of insurance she is now back on Wegovy  for pharmacoprophylaxis.  Medication will be increased to 1.7 mg once a week.

## 2024-03-08 NOTE — Progress Notes (Signed)
 Office: [phone]  /  Fax: [phone]  Weight Summary And Body Composition Analysis (BIA)  Vitals Temp: 98 F (36.7 C) BP: 106/70 Pulse Rate: 69 SpO2: 100 %   Anthropometric Measurements Height: 5' 1 (1.549 m) Weight: 163 lb (73.9 kg) BMI (Calculated): 30.81 Weight at Last Visit: 167 lb Weight Lost Since Last Visit: 4 lb Weight Gained Since Last Visit: 0 lb Starting Weight: 192 lb Total Weight Loss (lbs): 29 lb (13.2 kg) Peak Weight: 197 lb   Body Composition  Body Fat %: 37.3 % Fat Mass (lbs): 61.6 lbs Muscle Mass (lbs): 96.6 lbs Total Body Water (lbs): 68.6 lbs Visceral Fat Rating : 9    RMR: 1930  Today's Visit #: 29  Starting Date: 10/23/21   Subjective   Chief Complaint: Obesity  Alexandra [name] is here to discuss her progress with her obesity treatment plan. She is following the Category 1 plan - 1000 kcal per day and states she is following her eating plan approximately 70-80% of the time. She states she is exercising 30 minutes 5 times per week..  Weight Progress Since Last Visit:  Since last office visit she has lost 4 pounds. She reports good adherence to reduced calorie nutritional plan. She has been working on reading food labels, not skipping meals, increasing protein intake at every meal, eating more fruits, eating more vegetables, drinking more water, making healthier choices, reducing portion sizes, and incorporating more whole foods   Nutritional 24 HR Recall: Intake consistent with prescribed nutritional plan  Challenges affecting patient progress: strong hunger signals and/or impaired satiety / inhibitory control.   Orexigenic Control: Reports improved problems with appetite and hunger signals.  Reports improved problems with satiety and satiation.  Denies problems with eating patterns and portion control.  Denies abnormal cravings. Denies feeling deprived or restricted.   Pharmacotherapy for weight management: She is currently  taking Wegovy  with adequate clinical response  and without side effects..   Assessment and Plan   Treatment Plan For Obesity:  Recommended Dietary Goals  [name] is currently in the action stage of change. As such, her goal is to continue weight management plan. She has agreed to: continue current plan  Behavioral Health and Counseling  We discussed the following behavioral modification strategies today: continue to work on maintaining a reduced calorie state, getting the recommended amount of protein, incorporating whole foods, making healthy choices, staying well hydrated and practicing mindfulness when eating..  Additional education and resources provided today: None  Recommended Physical Activity Goals  [name] has been advised to work up to 150 minutes of moderate intensity aerobic activity a week and strengthening exercises 2-3 times per week for cardiovascular health, weight loss maintenance and preservation of muscle mass.   She has agreed to :  Increase the intensity, frequency or duration of strengthening exercises , Increase the intensity, frequency or duration of aerobic exercises  , and goal of 240 minutes a week  Pharmacotherapy and Medical Interventions  Increase Wegovy  to 1.7 mg once a week  Associated Conditions Impacted by Obesity Treatment  Assessment & Plan OSA (obstructive sleep apnea) I reviewed sleep study from 2018 she has severe sleep apnea with an REI of 40/h she had moderate desaturations with a minimum saturation of 82% time below 89% was 10 minutes.  She is currently on PAP therapy.  Losing 50% of body weight may reduce AHI Prediabetes Most recent hemoglobin A1c was 5.9 from June.  She has had a lapse in treatment because of insurance  she is now back on Wegovy  for pharmacoprophylaxis.  Medication will be increased to 1.7 mg once a week. Generalized obesity Weight: decrease of 34 lb (17.3%) over 2 years, 4 months  Start: 10/09/2021 197 lb (89.4 kg)   End: 03/07/2024 163 lb (73.9 kg)   Continue 1000-calorie nutrition plan Increase Wegovy  to 1.7 mg once a week Work on increasing volume of physical activity for goal of 240 minutes a week with emphasis on strengthening  BMI 31.0-31.9,adult     Objective   Physical Exam:  Blood pressure 106/70, pulse 69, temperature 98 F (36.7 C), height 5' 1 (1.549 m), weight 163 lb (73.9 kg), SpO2 100%. Body mass index is 30.8 kg/m.  General: She is overweight, cooperative, alert, well developed, and in no acute distress. PSYCH: Has normal mood, affect and thought process.   HEENT: EOMI, sclerae are anicteric. Lungs: Normal breathing effort, no conversational dyspnea. Extremities: No edema.  Neurologic: No gross sensory or motor deficits. No tremors or fasciculations noted.    Diagnostic Data Reviewed:  BMET    Component Value Date/Time   NA 143 01/07/2023 1115   K 4.4 01/07/2023 1115   CL 107 (H) 01/07/2023 1115   CO2 22 01/07/2023 1115   GLUCOSE 81 01/07/2023 1115   BUN 12 01/07/2023 1115   CREATININE 0.85 01/07/2023 1115   CALCIUM 9.4 01/07/2023 1115   Lab Results  Component Value Date   HGBA1C 5.9 (H) 01/07/2023   HGBA1C 6.0 (H) 10/23/2021   Lab Results  Component Value Date   INSULIN  15.4 01/07/2023   INSULIN  17.3 10/23/2021   Lab Results  Component Value Date   TSH 1.070 10/23/2021   CBC    Component Value Date/Time   WBC 6.7 04/10/2021 1519   RBC 4.55 04/10/2021 1519   HGB 12.1 04/10/2021 1519   HCT 38.1 04/10/2021 1519   PLT 302.0 04/10/2021 1519   MCV 83.9 04/10/2021 1519   MCHC 31.8 04/10/2021 1519   RDW 14.4 04/10/2021 1519   Iron Studies No results found for: IRON, TIBC, FERRITIN, IRONPCTSAT Lipid Panel     Component Value Date/Time   CHOL 161 05/07/2022 1101   TRIG 53 05/07/2022 1101   HDL 51 05/07/2022 1101   CHOLHDL 3.2 05/07/2022 1101   LDLCALC 99 05/07/2022 1101   Hepatic Function Panel     Component Value Date/Time   PROT  7.1 01/07/2023 1115   ALBUMIN 4.4 01/07/2023 1115   AST 16 01/07/2023 1115   ALT 15 01/07/2023 1115   ALKPHOS 74 01/07/2023 1115   BILITOT 0.3 01/07/2023 1115      Component Value Date/Time   TSH 1.070 10/23/2021 1006   Nutritional Lab Results  Component Value Date   VD25OH 39.4 01/07/2023   VD25OH 59.9 05/07/2022   VD25OH 36.5 10/23/2021    Medications: Outpatient Encounter Medications as of 03/07/2024  Medication Sig   albuterol  (VENTOLIN  HFA) 108 (90 Base) MCG/ACT inhaler Inhale 2 puffs into the lungs every 4 (four) hours as needed for wheezing or shortness of breath.   [place] Saline Nasal No-Drip GEL Place 1 spray into the nose every 8 (eight) hours as needed.   clobetasol  (OLUX ) 0.05 % topical foam Apply topically 2 (two) times daily.   fluticasone  (FLONASE ) 50 MCG/ACT nasal spray SHAKE LIQUID AND USE 1 SPRAY IN EACH NOSTRIL DAILY   fluticasone  (FLOVENT  HFA) 44 MCG/ACT inhaler Inhale 2 puffs into the lungs 2 (two) times daily as needed (asthma symptoms).   [START ON 06/02/2024]  semaglutide -weight management (WEGOVY ) 1.7 MG/0.75ML SOAJ SQ injection Inject 1.7 mg into the skin once a week.   tacrolimus  (PROTOPIC ) 0.1 % ointment Apply topically at bedtime.   Vitamin D , Ergocalciferol , (DRISDOL ) 1.25 MG (50000 UNIT) CAPS capsule Take 1 capsule (50,000 Units total) by mouth every 14 (fourteen) days.   [DISCONTINUED] semaglutide -weight management (WEGOVY ) 1 MG/0.5ML SOAJ SQ injection Inject 1 mg into the skin once a week.   No facility-administered encounter medications on file as of 03/07/2024.     Follow-Up   No follow-ups on file.SABRA She was informed of the importance of frequent follow up visits to maximize her success with intensive lifestyle modifications for her multiple health conditions.  Attestation Statement   Reviewed by clinician on day of visit: allergies, medications, problem list, medical history, surgical history, family history, social history, and previous  encounter notes.     Lucas Parker, MD

## 2024-03-08 NOTE — Assessment & Plan Note (Signed)
 Weight: decrease of 34 lb (17.3%) over 2 years, 4 months  Start: 10/09/2021 197 lb (89.4 kg)  End: 03/07/2024 163 lb (73.9 kg)   Continue 1000-calorie nutrition plan Increase Wegovy  to 1.7 mg once a week Work on increasing volume of physical activity for goal of 240 minutes a week with emphasis on strengthening

## 2024-03-10 LAB — CYTOLOGY - PAP
Comment: NEGATIVE
Diagnosis: NEGATIVE
High risk HPV: NEGATIVE

## 2024-03-17 DIAGNOSIS — M79604 Pain in right leg: Secondary | ICD-10-CM | POA: Diagnosis not present

## 2024-03-17 DIAGNOSIS — M79605 Pain in left leg: Secondary | ICD-10-CM | POA: Diagnosis not present

## 2024-03-22 ENCOUNTER — Telehealth (INDEPENDENT_AMBULATORY_CARE_PROVIDER_SITE_OTHER): Payer: Self-pay | Admitting: *Deleted

## 2024-03-22 NOTE — Telephone Encounter (Signed)
 Alexandra [name] (Key: [id number])   This is to inform you that your Prior Authorization request for the above member's Wegovy  1.7MG /0.75ML [place] SOAJ has been approved. If you are changing the member's therapy the previously approved therapy will be canceled and replaced. The authorization is valid from 02/21/2024 through 09/03/202   Patient notified via Mychart.

## 2024-04-04 ENCOUNTER — Ambulatory Visit (INDEPENDENT_AMBULATORY_CARE_PROVIDER_SITE_OTHER): Admitting: Internal Medicine

## 2024-04-17 ENCOUNTER — Other Ambulatory Visit: Payer: Self-pay

## 2024-04-17 MED ORDER — FLUTICASONE PROPIONATE HFA 44 MCG/ACT IN AERO: 2.0000 | INHALATION_SPRAY | Freq: Two times a day (BID) | RESPIRATORY_TRACT | 3 refills | Status: AC | PRN

## 2024-05-04 DIAGNOSIS — G4733 Obstructive sleep apnea (adult) (pediatric): Secondary | ICD-10-CM | POA: Diagnosis not present

## 2024-05-10 ENCOUNTER — Ambulatory Visit (INDEPENDENT_AMBULATORY_CARE_PROVIDER_SITE_OTHER): Admitting: Internal Medicine

## 2024-05-10 ENCOUNTER — Encounter (INDEPENDENT_AMBULATORY_CARE_PROVIDER_SITE_OTHER): Payer: Self-pay | Admitting: Internal Medicine

## 2024-05-10 VITALS — BP 93/61 | HR 68 | Temp 97.9°F | Ht 61.0 in | Wt 162.0 lb

## 2024-05-10 DIAGNOSIS — E669 Obesity, unspecified: Secondary | ICD-10-CM

## 2024-05-10 DIAGNOSIS — Z6830 Body mass index (BMI) 30.0-30.9, adult: Secondary | ICD-10-CM | POA: Diagnosis not present

## 2024-05-10 DIAGNOSIS — R7303 Prediabetes: Secondary | ICD-10-CM

## 2024-05-10 NOTE — Progress Notes (Signed)
 Office: [phone]  /  Fax: [phone]  Weight Summary and Body Composition Analysis (BIA)  Vitals Temp: 97.9 F (36.6 C) BP: 93/61 Pulse Rate: 68 SpO2: 100 %   Anthropometric Measurements Height: 5' 1 (1.549 m) Weight: 162 lb (73.5 kg) BMI (Calculated): 30.63 Weight at Last Visit: 163 lb Weight Lost Since Last Visit: 1 lb Weight Gained Since Last Visit: 0 lb Starting Weight: 192 lb Total Weight Loss (lbs): 30 lb (13.6 kg) Peak Weight: 197 lb   Body Composition  Body Fat %: 37.7 % Fat Mass (lbs): 61.2 lbs Muscle Mass (lbs): 96 lbs Total Body Water (lbs): 68.4 lbs Visceral Fat Rating : 9    RMR: 1930  Today's Visit #: 30  Starting Date: 10/23/21   Subjective   Chief Complaint: Obesity  Interval History Discussed the use of AI scribe software for clinical note transcription with the patient, who gave verbal consent to proceed.  History of Present Illness Alexandra [name] is a 58 year old female who presents for medical weight management.  Since last office visit she has lost 1 pound she is following a 1000-calorie target about 50% of the time.  She has not been exercising due to recent fall  She experiences persistent knee pain that significantly impacts her ability to engage in physical activities such as walking and exercise. This reduction in physical activity has led to a decrease in her overall activity levels.  She is currently on Wegovy , a medication for weight management, which she takes once a week. Her physical activity has been lower recently due to her knee injury.  She faces challenges in maintaining her diet, especially when traveling or during stressful periods. Her husband, who also struggles with weight issues, sometimes brings home food that is not conducive to her dietary needs. She finds it difficult to maintain her diet when traveling, as she often has to eat whatever is available at conferences or airports, which are not always  healthy options.  She experiences stress related to school and work, which has affected her eating habits. Despite these challenges, she reports a recent weight loss of one pound.     Challenges affecting patient progress: none.    Pharmacotherapy for weight management: She is currently taking Wegovy  with adequate clinical response  and without side effects..   Assessment and Plan   Treatment Plan For Obesity:  Recommended Dietary Goals  Alexandra [name] is currently in the action stage of change. As such, her goal is to continue weight management plan. She has agreed to: continue current plan  Behavioral Health and Counseling  We discussed the following behavioral modification strategies today: continue to work on maintaining a reduced calorie state, getting the recommended amount of protein, incorporating whole foods, making healthy choices, staying well hydrated and practicing mindfulness when eating. and increase protein intake, fibrous foods (25 grams per day for women, 30 grams for men) and water to improve satiety and decrease hunger signals. .  Additional education and resources provided today: None  Recommended Physical Activity Goals  Alexandra [name] has been advised to work up to 150 minutes of moderate intensity aerobic activity a week and strengthening exercises 2-3 times per week for cardiovascular health, weight loss maintenance and preservation of muscle mass.  She has agreed to :  Think about enjoyable ways to increase daily physical activity and overcoming barriers to exercise, Increase physical activity in their day and reduce sedentary time (increase NEAT)., Increase volume of physical activity to a goal of  240 minutes a week, and Combine aerobic and strengthening exercises for efficiency and improved cardiometabolic health.  Medical Interventions and Pharmacotherapy  We discussed various medication options to help Alexandra [name] with her weight loss efforts and we both agreed to :  Adequate clinical response to anti-obesity medication, continue current regimen  Associated Conditions Impacted by Obesity Treatment  Assessment & Plan Prediabetes Most recent hemoglobin A1c was 5.9 from June.  Continue Wegovy  for pharmacoprophylaxis along with nutrition and behavioral strategies. Generalized obesity Start BMI 36.28 BMI 30.0-30.9,adult   Obesity management is ongoing with Wegovy , contributing to weight loss. Challenges include reduced physical activity due to knee pain and lifestyle disruptions from travel and stress. Despite these, there has been a recent weight loss of one pound. She is aware of the need to focus on nutrition and physical activity alongside medication. The importance of maintaining weight during challenging periods, such as travel, is emphasized. Wegovy  is expected to provide a 15% weight loss as per published studies, with potential tolerance development around the 81-month mark. - Continue Wegovy  as prescribed. - Focus on improving nutrition and physical activity levels. - Encourage tracking of calories and protein intake. - Advise on maintaining weight during travel and stressful periods. - Schedule follow-up appointment in early December.        Objective   Physical Exam:  Blood pressure 93/61, pulse 68, temperature 97.9 F (36.6 C), height 5' 1 (1.549 m), weight 162 lb (73.5 kg), SpO2 100%. Body mass index is 30.61 kg/m.  General: She is overweight, cooperative, alert, well developed, and in no acute distress. PSYCH: Has normal mood, affect and thought process.   HEENT: EOMI, sclerae are anicteric. Lungs: Normal breathing effort, no conversational dyspnea. Extremities: No edema.  Neurologic: No gross sensory or motor deficits. No tremors or fasciculations noted.    Diagnostic Data Reviewed:  BMET    Component Value Date/Time   NA 143 01/07/2023 1115   K 4.4 01/07/2023 1115   CL 107 (H) 01/07/2023 1115   CO2 22 01/07/2023 1115    GLUCOSE 81 01/07/2023 1115   BUN 12 01/07/2023 1115   CREATININE 0.85 01/07/2023 1115   CALCIUM 9.4 01/07/2023 1115   Lab Results  Component Value Date   HGBA1C 5.9 (H) 01/07/2023   HGBA1C 6.0 (H) 10/23/2021   Lab Results  Component Value Date   INSULIN  15.4 01/07/2023   INSULIN  17.3 10/23/2021   Lab Results  Component Value Date   TSH 1.070 10/23/2021   CBC    Component Value Date/Time   WBC 6.7 04/10/2021 1519   RBC 4.55 04/10/2021 1519   HGB 12.1 04/10/2021 1519   HCT 38.1 04/10/2021 1519   PLT 302.0 04/10/2021 1519   MCV 83.9 04/10/2021 1519   MCHC 31.8 04/10/2021 1519   RDW 14.4 04/10/2021 1519   Iron Studies No results found for: IRON, TIBC, FERRITIN, IRONPCTSAT Lipid Panel     Component Value Date/Time   CHOL 161 05/07/2022 1101   TRIG 53 05/07/2022 1101   HDL 51 05/07/2022 1101   CHOLHDL 3.2 05/07/2022 1101   LDLCALC 99 05/07/2022 1101   Hepatic Function Panel     Component Value Date/Time   PROT 7.1 01/07/2023 1115   ALBUMIN 4.4 01/07/2023 1115   AST 16 01/07/2023 1115   ALT 15 01/07/2023 1115   ALKPHOS 74 01/07/2023 1115   BILITOT 0.3 01/07/2023 1115      Component Value Date/Time   TSH 1.070 10/23/2021 1006   Nutritional Lab Results  Component Value Date   VD25OH 39.4 01/07/2023   VD25OH 59.9 05/07/2022   VD25OH 36.5 10/23/2021    Medications: Outpatient Encounter Medications as of 05/10/2024  Medication Sig   albuterol  (VENTOLIN  HFA) 108 (90 Base) MCG/ACT inhaler Inhale 2 puffs into the lungs every 4 (four) hours as needed for wheezing or shortness of breath.   [place]yr Saline Nasal No-Drip GEL Place 1 spray into the nose every 8 (eight) hours as needed.   clobetasol  (OLUX ) 0.05 % topical foam Apply topically 2 (two) times daily.   fluticasone  (FLONASE ) 50 MCG/ACT nasal spray SHAKE LIQUID AND USE 1 SPRAY IN EACH NOSTRIL DAILY   fluticasone  (FLOVENT  HFA) 44 MCG/ACT inhaler Inhale 2 puffs into the lungs 2 (two) times daily as needed  (asthma symptoms).   [START ON 06/02/2024] semaglutide -weight management (WEGOVY ) 1.7 MG/0.75ML SOAJ SQ injection Inject 1.7 mg into the skin once a week.   tacrolimus  (PROTOPIC ) 0.1 % ointment Apply topically at bedtime.   Vitamin D , Ergocalciferol , (DRISDOL ) 1.25 MG (50000 UNIT) CAPS capsule Take 1 capsule (50,000 Units total) by mouth every 14 (fourteen) days.   No facility-administered encounter medications on file as of 05/10/2024.     Follow-Up   Return in about 6 weeks (around 06/21/2024) for For Weight Mangement with Dr. [email or web address].[email or web address] She was informed of the importance of frequent follow up visits to maximize her success with intensive lifestyle modifications for her multiple health conditions.  Attestation Statement   Reviewed by clinician on day of visit: allergies, medications, problem list, medical history, surgical history, family history, social history, and previous encounter notes.     Lucas Francyne, MD

## 2024-05-10 NOTE — Assessment & Plan Note (Signed)
 Most recent hemoglobin A1c was 5.9 from June.  Continue Wegovy  for pharmacoprophylaxis along with nutrition and behavioral strategies.

## 2024-05-10 NOTE — Assessment & Plan Note (Signed)
   Obesity management is ongoing with Wegovy , contributing to weight loss. Challenges include reduced physical activity due to knee pain and lifestyle disruptions from travel and stress. Despite these, there has been a recent weight loss of one pound. She is aware of the need to focus on nutrition and physical activity alongside medication. The importance of maintaining weight during challenging periods, such as travel, is emphasized. Wegovy  is expected to provide a 15% weight loss as per published studies, with potential tolerance development around the 52-month mark. - Continue Wegovy  as prescribed. - Focus on improving nutrition and physical activity levels. - Encourage tracking of calories and protein intake. - Advise on maintaining weight during travel and stressful periods. - Schedule follow-up appointment in early December.

## 2024-05-15 ENCOUNTER — Other Ambulatory Visit: Payer: Self-pay | Admitting: Medical Genetics

## 2024-05-15 DIAGNOSIS — Z006 Encounter for examination for normal comparison and control in clinical research program: Secondary | ICD-10-CM

## 2024-05-29 ENCOUNTER — Encounter (INDEPENDENT_AMBULATORY_CARE_PROVIDER_SITE_OTHER): Payer: Self-pay

## 2024-05-29 ENCOUNTER — Other Ambulatory Visit (INDEPENDENT_AMBULATORY_CARE_PROVIDER_SITE_OTHER): Payer: Self-pay | Admitting: Internal Medicine

## 2024-05-29 DIAGNOSIS — R7303 Prediabetes: Secondary | ICD-10-CM

## 2024-05-29 DIAGNOSIS — Z6831 Body mass index (BMI) 31.0-31.9, adult: Secondary | ICD-10-CM

## 2024-05-29 DIAGNOSIS — G4733 Obstructive sleep apnea (adult) (pediatric): Secondary | ICD-10-CM

## 2024-05-29 DIAGNOSIS — E669 Obesity, unspecified: Secondary | ICD-10-CM

## 2024-06-04 DIAGNOSIS — G4733 Obstructive sleep apnea (adult) (pediatric): Secondary | ICD-10-CM | POA: Diagnosis not present

## 2024-07-18 ENCOUNTER — Ambulatory Visit (INDEPENDENT_AMBULATORY_CARE_PROVIDER_SITE_OTHER): Payer: Self-pay | Admitting: Internal Medicine

## 2024-07-18 ENCOUNTER — Encounter (INDEPENDENT_AMBULATORY_CARE_PROVIDER_SITE_OTHER): Payer: Self-pay | Admitting: Internal Medicine

## 2024-07-18 VITALS — BP 103/67 | HR 81 | Temp 98.1°F | Ht 61.0 in | Wt 163.0 lb

## 2024-07-18 DIAGNOSIS — E669 Obesity, unspecified: Secondary | ICD-10-CM | POA: Diagnosis not present

## 2024-07-18 DIAGNOSIS — R638 Other symptoms and signs concerning food and fluid intake: Secondary | ICD-10-CM

## 2024-07-18 DIAGNOSIS — G4733 Obstructive sleep apnea (adult) (pediatric): Secondary | ICD-10-CM

## 2024-07-18 DIAGNOSIS — Z6830 Body mass index (BMI) 30.0-30.9, adult: Secondary | ICD-10-CM

## 2024-07-18 DIAGNOSIS — R7303 Prediabetes: Secondary | ICD-10-CM

## 2024-07-18 NOTE — Assessment & Plan Note (Signed)
 BMI of 30. Significant weight loss of 45 pounds over 19 months, followed by a 17-pound regain after discontinuing Wegovy . Currently maintaining weight since August with Wegovy  1.7 mg weekly. Reports stress and burnout affecting weight management. Issues with Wegovy  pens resolved with new supply. Acknowledges lifestyle factors impacting weight, including stress, sleep, and exercise. Current caloric intake is 1000 calories 50% of the time, with no exercise due to back pain. Metabolic rate is approximately 1300-1380 calories per day, requiring a caloric deficit for further weight loss. Wegovy  aids appetite control but does not address stress or exercise. - Continue Wegovy  1.7 mg weekly. - Will consider increasing Wegovy  to 2.4 mg for better appetite control and emotional hunger management. - Encouraged low-impact exercises such as aqua or chair exercises. - Advised consultation with a doctor for back pain evaluation and potential physical therapy. - Encouraged consistent caloric intake of 1000 calories per day. - Advised on mindful eating practices, including portion control and avoiding high-calorie dressings and toppings. - Encouraged self-care activities such as massages and relaxation techniques.

## 2024-07-18 NOTE — Assessment & Plan Note (Signed)
 She has gastrointestinal and neurological phenotype.  She has increased orexigenic signaling, impaired satiety and inhibitory control. This is secondary to an abnormal energy regulation system and pathological neurohormonal pathways characteristic of excess adiposity.  In addition to nutritional and behavioral strategies she benefits from pharmacotherapy.  Continue Wegovy  at current dose consider increasing to 2.4 mg once a week

## 2024-07-18 NOTE — Progress Notes (Signed)
 "  Office: [phone]  /  Fax: [phone]  Weight Summary and Body Composition Analysis (BIA)  Vitals Temp: 98.1 F (36.7 C) BP: 103/67 Pulse Rate: 81 SpO2: 98 %   Anthropometric Measurements Height: 5' 1 (1.549 m) Weight: 163 lb (73.9 kg) BMI (Calculated): 30.81 Weight at Last Visit: 162 lb Weight Lost Since Last Visit: 0 Weight Gained Since Last Visit: 1 lb Starting Weight: 192 lb Total Weight Loss (lbs): 19 lb (8.618 kg)   Body Composition  Body Fat %: 38.6 % Fat Mass (lbs): 63 lbs Muscle Mass (lbs): 95.2 lbs Total Body Water (lbs): 68.6 lbs Visceral Fat Rating : 10    No data recorded Today's Visit #: 31  Starting Date: 10/23/21   Subjective   Chief Complaint: Obesity  Interval History  Discussed the use of AI scribe software for clinical note transcription with the patient, who gave verbal consent to proceed.  History of Present Illness Alexandra [name] is a 58 year old female who presents for medical weight management.  She has restarted Wegovy  at a dose of 1.7 mg once a week. She follows a 1,000 calorie nutrition plan about 50% of the time and has not been exercising due to back pain from a fall in September.  She experienced a weight loss of 45 pounds over a year and nine months, reaching a weight of 152 pounds. However, after discontinuing medication in March, she regained 17 pounds by June, reaching 170 pounds. Since restarting Wegovy  in August, she has maintained her weight despite not increasing the dose beyond 1.7 mg due to defective pens that delivered less than the intended dose.  She feels stressed and burnt out, attributing it to a difficult semester at school and organizing a large birthday party for her father. Her physical activity has decreased due to back pain, which is persistent and affects her ability to sit, walk, and cross her legs.  She has been eating out more frequently, especially during the holidays, and has not been meal  prepping as she used to when she was losing weight. She mentions eating more salads but acknowledges adding high-calorie toppings like cheese and bacon.  Her current medication includes Wegovy  at 1.7 mg weekly. She has two boxes left at this dose. She also uses meal replacements and is exploring new options that are plant-based and contain fiber.     Challenges affecting patient progress: low volume of physical activity at present  and all-or- none mindset.    Pharmacotherapy for weight management: She is currently taking Wegovy  with adequate clinical response  and without side effects..   Assessment and Plan   Treatment Plan For Obesity:  Recommended Dietary Goals  [name] is currently in the action stage of change. As such, her goal is to continue weight management plan. She has agreed to: continue current plan and continue to work on implementation of reduced calorie nutrition plan (RCNP)  Behavioral Health and Counseling  We discussed the following behavioral modification strategies today: continue to work on maintaining a reduced calorie state, getting the recommended amount of protein, incorporating whole foods, making healthy choices, staying well hydrated and practicing mindfulness when eating., increase protein intake, fibrous foods (25 grams per day for women, 30 grams for men) and water to improve satiety and decrease hunger signals. , getting back on track after recent lapse, avoid all-or-none thinking, and employ healthy strategies when dealing with emotional hunger.  Additional education and resources provided today: None  Recommended Physical Activity Goals  Nixon has been advised to work up to 150 minutes of moderate intensity aerobic activity a week and strengthening exercises 2-3 times per week for cardiovascular health, weight loss maintenance and preservation of muscle mass.  She has agreed to :  Think about enjoyable ways to increase daily physical activity and  overcoming barriers to exercise and Increase physical activity in their day and reduce sedentary time (increase NEAT).  Medical Interventions and Pharmacotherapy  We discussed various medication options to help [name] with her weight loss efforts and we both agreed to : Adequate clinical response to anti-obesity medication, continue current regimen and consider increasing to 2.4 mg once a week  Associated Conditions Impacted by Obesity Treatment  Assessment & Plan Generalized obesity Start BMI 36.28 BMI of 30. Significant weight loss of 45 pounds over 19 months, followed by a 17-pound regain after discontinuing Wegovy . Currently maintaining weight since August with Wegovy  1.7 mg weekly. Reports stress and burnout affecting weight management. Issues with Wegovy  pens resolved with new supply. Acknowledges lifestyle factors impacting weight, including stress, sleep, and exercise. Current caloric intake is 1000 calories 50% of the time, with no exercise due to back pain. Metabolic rate is approximately 1300-1380 calories per day, requiring a caloric deficit for further weight loss. Wegovy  aids appetite control but does not address stress or exercise. - Continue Wegovy  1.7 mg weekly. - Will consider increasing Wegovy  to 2.4 mg for better appetite control and emotional hunger management. - Encouraged low-impact exercises such as aqua or chair exercises. - Advised consultation with a doctor for back pain evaluation and potential physical therapy. - Encouraged consistent caloric intake of 1000 calories per day. - Advised on mindful eating practices, including portion control and avoiding high-calorie dressings and toppings. - Encouraged self-care activities such as massages and relaxation techniques. Abnormal food appetite She has gastrointestinal and neurological phenotype.  She has increased orexigenic signaling, impaired satiety and inhibitory control. This is secondary to an abnormal energy regulation  system and pathological neurohormonal pathways characteristic of excess adiposity.  In addition to nutritional and behavioral strategies she benefits from pharmacotherapy.  Continue Wegovy  at current dose consider increasing to 2.4 mg once a week  Prediabetes Most recent hemoglobin A1c was 5.9 from June.  continue with medically supervised weight management plan inclusive of GLP-1. OSA (obstructive sleep apnea) [facility] 2018 showed she has severe sleep apnea with an REI of 40/h she had moderate desaturations with a minimum saturation of 82% time below 89% was 10 minutes.  She is currently on PAP therapy.  Losing 15% will reduce AHI continue with medically supervised weight management plan inclusive of GLP-1.         Objective   Physical Exam:  Blood pressure 103/67, pulse 81, temperature 98.1 F (36.7 C), height 5' 1 (1.549 m), weight 163 lb (73.9 kg), SpO2 98%. Body mass index is 30.8 kg/m.  General: She is overweight, cooperative, alert, well developed, and in no acute distress. PSYCH: Has normal mood, affect and thought process.   HEENT: EOMI, sclerae are anicteric. Lungs: Normal breathing effort, no conversational dyspnea. Extremities: No edema.  Neurologic: No gross sensory or motor deficits. No tremors or fasciculations noted.    Diagnostic Data Reviewed:  BMET    Component Value Date/Time   NA 143 01/07/2023 1115   K 4.4 01/07/2023 1115   CL 107 (H) 01/07/2023 1115   CO2 22 01/07/2023 1115   GLUCOSE 81 01/07/2023 1115   BUN 12 01/07/2023 1115   CREATININE 0.85 01/07/2023  1115   CALCIUM 9.4 01/07/2023 1115   Lab Results  Component Value Date   HGBA1C 5.9 (H) 01/07/2023   HGBA1C 6.0 (H) 10/23/2021   Lab Results  Component Value Date   INSULIN  15.4 01/07/2023   INSULIN  17.3 10/23/2021   Lab Results  Component Value Date   TSH 1.070 10/23/2021   CBC    Component Value Date/Time   WBC 6.7 04/10/2021 1519   RBC 4.55 04/10/2021 1519   HGB 12.1 04/10/2021 1519    HCT 38.1 04/10/2021 1519   PLT 302.0 04/10/2021 1519   MCV 83.9 04/10/2021 1519   MCHC 31.8 04/10/2021 1519   RDW 14.4 04/10/2021 1519   Iron Studies No results found for: IRON, TIBC, FERRITIN, IRONPCTSAT Lipid Panel     Component Value Date/Time   CHOL 161 05/07/2022 1101   TRIG 53 05/07/2022 1101   HDL 51 05/07/2022 1101   CHOLHDL 3.2 05/07/2022 1101   LDLCALC 99 05/07/2022 1101   Hepatic Function Panel     Component Value Date/Time   PROT 7.1 01/07/2023 1115   ALBUMIN 4.4 01/07/2023 1115   AST 16 01/07/2023 1115   ALT 15 01/07/2023 1115   ALKPHOS 74 01/07/2023 1115   BILITOT 0.3 01/07/2023 1115      Component Value Date/Time   TSH 1.070 10/23/2021 1006   Nutritional Lab Results  Component Value Date   VD25OH 39.4 01/07/2023   VD25OH 59.9 05/07/2022   VD25OH 36.5 10/23/2021    Medications: Outpatient Encounter Medications as of 07/18/2024  Medication Sig   albuterol  (VENTOLIN  HFA) 108 (90 Base) MCG/ACT inhaler Inhale 2 puffs into the lungs every 4 (four) hours as needed for wheezing or shortness of breath.   [place]yr Saline Nasal No-Drip GEL Place 1 spray into the nose every 8 (eight) hours as needed.   clobetasol  (OLUX ) 0.05 % topical foam Apply topically 2 (two) times daily.   fluticasone  (FLONASE ) 50 MCG/ACT nasal spray SHAKE LIQUID AND USE 1 SPRAY IN EACH NOSTRIL DAILY   fluticasone  (FLOVENT  HFA) 44 MCG/ACT inhaler Inhale 2 puffs into the lungs 2 (two) times daily as needed (asthma symptoms).   tacrolimus  (PROTOPIC ) 0.1 % ointment Apply topically at bedtime.   Vitamin D , Ergocalciferol , (DRISDOL ) 1.25 MG (50000 UNIT) CAPS capsule Take 1 capsule (50,000 Units total) by mouth every 14 (fourteen) days.   WEGOVY  1.7 MG/0.75ML SOAJ SQ injection INJECT 1.7MG  SUBCUTANEOUSLYONCE A WEEK   No facility-administered encounter medications on file as of 07/18/2024.     Follow-Up   Return in about 6 weeks (around 08/29/2024).Alexandra [name] She was informed of the importance of  frequent follow up visits to maximize her success with intensive lifestyle modifications for her multiple health conditions.  Attestation Statement   Reviewed by clinician on day of visit: allergies, medications, problem list, medical history, surgical history, family history, social history, and previous encounter notes.     Lucas Parker, MD  "

## 2024-07-18 NOTE — Assessment & Plan Note (Signed)
 Most recent hemoglobin A1c was 5.9 from June.  continue with medically supervised weight management plan inclusive of GLP-1.

## 2024-07-18 NOTE — Assessment & Plan Note (Signed)
 [facility] 2018 showed she has severe sleep apnea with an REI of 40/h she had moderate desaturations with a minimum saturation of 82% time below 89% was 10 minutes.  She is currently on PAP therapy.  Losing 15% will reduce AHI continue with medically supervised weight management plan inclusive of GLP-1.

## 2024-07-28 ENCOUNTER — Other Ambulatory Visit: Payer: Self-pay | Admitting: Family Medicine

## 2024-07-28 DIAGNOSIS — Z1231 Encounter for screening mammogram for malignant neoplasm of breast: Secondary | ICD-10-CM

## 2024-08-04 ENCOUNTER — Ambulatory Visit (HOSPITAL_COMMUNITY)

## 2024-08-04 ENCOUNTER — Ambulatory Visit (HOSPITAL_COMMUNITY): Admission: EM | Admit: 2024-08-04 | Discharge: 2024-08-04 | Disposition: A | Discharge status: Home / Self Care

## 2024-08-04 ENCOUNTER — Encounter (HOSPITAL_COMMUNITY): Payer: Self-pay

## 2024-08-04 DIAGNOSIS — M25562 Pain in left knee: Secondary | ICD-10-CM | POA: Diagnosis not present

## 2024-08-04 DIAGNOSIS — M5441 Lumbago with sciatica, right side: Secondary | ICD-10-CM | POA: Diagnosis not present

## 2024-08-04 MED ORDER — DICLOFENAC SODIUM 75 MG PO TBEC: 75.0000 mg | DELAYED_RELEASE_TABLET | Freq: Two times a day (BID) | ORAL | 0 refills | Status: AC

## 2024-08-04 MED ORDER — LIDOCAINE 5 % EX PTCH: 1.0000 | MEDICATED_PATCH | CUTANEOUS | 0 refills | Status: AC

## 2024-08-04 MED ORDER — BACLOFEN 10 MG PO TABS: 10.0000 mg | ORAL_TABLET | Freq: Three times a day (TID) | ORAL | 0 refills | Status: AC

## 2024-08-04 NOTE — ED Triage Notes (Signed)
 Patient here today with c/o Low back pain since 03/31/2024 after having a fall. Patient has taking Diclofenac  and lidocaine  patches with some relief. Patient also has some left knee discomfort and has increased pain with kneeling down.

## 2024-08-04 NOTE — Discharge Instructions (Addendum)
 X-ray of back and knee did not reveal any underlying injuries. I did prescribe baclofen  that you can take every 8 hours as needed for back pain and spasms.  This can cause some drowsiness so do not drive, work, or drink alcohol while taking this.   Otherwise recommend continuing with previously prescribed diclofenac  and lidocaine  patches for pain relief. You can also take 500 to 1000 mg of Tylenol  every 6-8 hours as needed for breakthrough pain. Alternate between ice and heat as needed for pain. You can wear the Ace wrap on your knee to provide compression and comfort to the knee. I have attached information for [facility] that you can follow-up with if your pain continues for further evaluation and management. Otherwise follow-up with your primary care provider or return here as needed.

## 2024-08-04 NOTE — ED Provider Notes (Signed)
 " [facility]    CSN: [id number] Arrival date & time: 08/04/24  1727      History   Chief Complaint Chief Complaint  Patient presents with   Back Pain   Knee Pain    HPI GENESEE [name] is a 59 y.o. female.   Patient presents with continued generalized low back pain particularly to midline of low back since fall that occurred on 03/31/2024.  Patient reports that the pain has been consistent since this fall but has worsened over the last few days.  Patient reports that sometimes she has some pain that radiates down her right leg as well.  Patient reports that she has been taking diclofenac  and using lidocaine  patches with minimal relief.  Denies any saddle anesthesia or bowel/bladder incontinence.  Patient states that she has also had continued discomfort to her left knee since the fall as she did fall directly on her knee.  Patient states that the pain has gotten better over the last few months, but is still persisting and would like this evaluated as well.  Patient has been able to walk without much difficulty.  Patient reports that she did go to the ER on the day of the fall and did not have any imaging at that time and was prescribed diclofenac  and lidocaine  patches during that visit.  The history is provided by the patient and medical records.  Back Pain Knee Pain Associated symptoms: back pain     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, unspecified    Seasonal allergies    Sleep apnea    Spasm     Patient Active Problem List   Diagnosis Date Noted   BMI 31.0-31.9,adult 02/08/2024   Abnormal food appetite 10/05/2023   Paresthesia 09/06/2023   Pain of left hip 02/04/2023   Alopecia areata 02/04/2023   Intrinsic atopic dermatitis 02/04/2023   On long term drug therapy 02/04/2023   Generalized obesity Start BMI 36.28 02/03/2023   Pure hypercholesterolemia 05/14/2022   Laryngopharyngeal reflux 01/01/2022   Gastroesophageal reflux  disease 12/25/2021   Class 2 severe obesity with serious comorbidity and body mass index (BMI) of 36.0 to 36.9 in adult 12/25/2021   OSA (obstructive sleep apnea) 10/28/2021   Class 2 obesity due to excess calories with body mass index (BMI) of 36.0 to 36.9 in adult 10/28/2021   Prediabetes 10/27/2021   Chronic cough 01/31/2021   History of COVID-19 01/31/2021    Past Surgical History:  Procedure Laterality Date   BREAST BIOPSY Left 01/24/2024   MM LT BREAST BX W LOC DEV 1ST LESION IMAGE BX SPEC STEREO GUIDE 01/24/2024 [facility]   CESAREAN SECTION     CHOLECYSTECTOMY      OB History     Gravida  2   Para  2   Term  0   Preterm  0   AB  0   Living  0      SAB  0   IAB  0   Ectopic  0   Multiple  0   Live Births  0            Home Medications    Prior to Admission medications  Medication Sig Start Date End Date Taking? Authorizing Provider  baclofen  (LIORESAL ) 10 MG tablet Take 1 tablet (10 mg total) by mouth 3 (three) times daily. 08/04/24  Yes Johnie, Labradford Schnitker A, NP  diclofenac  (VOLTAREN ) 75 MG EC tablet Take 1 tablet (  75 mg total) by mouth 2 (two) times daily. 08/04/24  Yes Trigg Delarocha A, NP  lidocaine  (LIDODERM ) 5 % Place 1 patch onto the skin daily. Remove & Discard patch within 12 hours or as directed by MD 08/04/24  Yes Johnie Flaming A, NP  albuterol  (VENTOLIN  HFA) 108 (90 Base) MCG/ACT inhaler Inhale 2 puffs into the lungs every 4 (four) hours as needed for wheezing or shortness of breath. 11/03/23   Meade Verdon RAMAN, MD  clobetasol  (OLUX ) 0.05 % topical foam Apply topically 2 (two) times daily. 11/06/21   Livingston Rigg, MD  fluticasone  (FLONASE ) 50 MCG/ACT nasal spray SHAKE LIQUID AND USE 1 SPRAY IN EACH NOSTRIL DAILY 11/03/23   Meade Verdon RAMAN, MD  fluticasone  (FLOVENT  HFA) 44 MCG/ACT inhaler Inhale 2 puffs into the lungs 2 (two) times daily as needed (asthma symptoms). 04/17/24   Desai, Nikita S, MD  hydrocortisone (ANUSOL-HC) 25 MG  suppository Place 25 mg rectally 2 (two) times daily as needed for hemorrhoids.    [provider]  tacrolimus  (PROTOPIC ) 0.1 % ointment Apply topically at bedtime. 11/06/21   Livingston Rigg, MD  WEGOVY  1.7 MG/0.75ML SOAJ SQ injection INJECT 1.7MG  SUBCUTANEOUSLYONCE A WEEK 05/29/24   Francyne Romano, MD    Family History Family History  Problem Relation Age of Onset   Breast cancer Mother    Depression Mother    High Cholesterol Mother    Obesity Mother    High blood pressure Father    Sleep apnea Father    Breast cancer Maternal Aunt     Social History Social History[1]   Allergies   Penicillins   Review of Systems Review of Systems  Musculoskeletal:  Positive for back pain.   Per HPI  Physical Exam Triage Vital Signs ED Triage Vitals  Encounter Vitals Group     BP 08/04/24 1850 114/70     Girls Systolic BP Percentile --      Girls Diastolic BP Percentile --      Boys Systolic BP Percentile --      Boys Diastolic BP Percentile --      Pulse Rate 08/04/24 1850 66     Resp 08/04/24 1850 16     Temp 08/04/24 1850 98.1 F (36.7 C)     Temp Source 08/04/24 1850 Oral     SpO2 08/04/24 1850 96 %     Weight --      Height --      Head Circumference --      Peak Flow --      Pain Score 08/04/24 1847 5     Pain Loc --      Pain Education --      Exclude from Growth Chart --    No data found.  Updated Vital Signs BP 114/70 (BP Location: Right Arm)   Pulse 66   Temp 98.1 F (36.7 C) (Oral)   Resp 16   LMP  (LMP Unknown)   SpO2 96%   Visual Acuity Right Eye Distance:   Left Eye Distance:   Bilateral Distance:    Right Eye Near:   Left Eye Near:    Bilateral Near:     Physical Exam Vitals and nursing note reviewed.  Constitutional:      General: She is awake. She is not in acute distress.    Appearance: Normal appearance. She is well-developed and well-groomed. She is not ill-appearing.  Musculoskeletal:     Cervical back: Normal.  Thoracic back: Normal.     Lumbar back: Tenderness and bony tenderness present. No swelling, edema, deformity, signs of trauma, lacerations or spasms. Normal range of motion. Negative right straight leg raise test and negative left straight leg raise test. No scoliosis.       Back:     Left knee: No swelling, deformity, effusion, erythema or bony tenderness. Normal range of motion. Tenderness present over the lateral joint line.     Comments: Generalized tenderness noted to low back with mild spinous process tenderness.  Skin:    General: Skin is warm and dry.  Neurological:     General: No focal deficit present.     Mental Status: She is alert and oriented to person, place, and time. Mental status is at baseline.  Psychiatric:        Behavior: Behavior is cooperative.       UC Treatments / Results  Labs (all labs ordered are listed, but only abnormal results are displayed) Labs Reviewed - No data to display  EKG   Radiology DG Lumbar Spine Complete Result Date: 08/04/2024 EXAM: 4 VIEW(S) XRAY OF THE LUMBAR SPINE 08/04/2024 07:15:00 PM COMPARISON: None available. CLINICAL HISTORY: continued low back and left knee pain after fall in September FINDINGS: LUMBAR SPINE: BONES: Vertebral body heights are maintained. Alignment is normal. DISCS AND DEGENERATIVE CHANGES: No severe degenerative changes. SOFT TISSUES: There is an IUD in the pelvis. There are surgical clips in the right upper quadrant. No acute abnormality. IMPRESSION: 1. No acute findings. Electronically signed by: Greig Pique MD 08/04/2024 07:28 PM EST RP Workstation: [id number]   DG Knee 2 Views Left Result Date: 08/04/2024 EXAM: 1 or 2 VIEW(S) XRAY OF THE LEFT KNEE 08/04/2024 07:15:00 PM COMPARISON: None available. CLINICAL HISTORY: continued low back and left knee pain after fall in September FINDINGS: BONES AND JOINTS: No acute bony abnormality. No malalignment. Slight joint space narrowing in the medial compartment. No  significant joint effusion. SOFT TISSUES: Unremarkable. IMPRESSION: 1. No acute bony abnormality. Electronically signed by: Franky Crease MD 08/04/2024 07:28 PM EST RP Workstation: [id number]    Procedures Procedures (including critical care time)  Medications Ordered in UC Medications - No data to display  Initial Impression / Assessment and Plan / UC Course  I have reviewed the triage vital signs and the nursing notes.  Pertinent labs & imaging results that were available during my care of the patient were reviewed by me and considered in my medical decision making (see chart for details).     Patient is overall well-appearing.  Vitals are stable.  Lumbar and knee x-ray ordered.  I independently interpreted these images and there is no acute osseous abnormality noted.  Radiology report confirms this.  Provided patient with Ace wrap for knee.  Refilled prescriptions for diclofenac  and lidocaine  patches for pain relief.  Also prescribed baclofen  for muscle pain and spasms.  Given orthopedic follow-up.  Discussed follow-up and return precautions. Final Clinical Impressions(s) / UC Diagnoses   Final diagnoses:  Acute pain of left knee  Acute low back pain with right-sided sciatica, unspecified back pain laterality     Discharge Instructions      X-ray of back and knee did not reveal any underlying injuries. I did prescribe baclofen  that you can take every 8 hours as needed for back pain and spasms.  This can cause some drowsiness so do not drive, work, or drink alcohol while taking this.   Otherwise recommend continuing with previously prescribed diclofenac   and lidocaine  patches for pain relief. You can also take 500 to 1000 mg of Tylenol  every 6-8 hours as needed for breakthrough pain. Alternate between ice and heat as needed for pain. You can wear the Ace wrap on your knee to provide compression and comfort to the knee. I have attached information for [facility] that you can  follow-up with if your pain continues for further evaluation and management. Otherwise follow-up with your primary care provider or return here as needed.     ED Prescriptions     Medication Sig Dispense Auth. Provider   baclofen  (LIORESAL ) 10 MG tablet Take 1 tablet (10 mg total) by mouth 3 (three) times daily. 30 each Johnie Flaming A, NP   lidocaine  (LIDODERM ) 5 % Place 1 patch onto the skin daily. Remove & Discard patch within 12 hours or as directed by MD 30 patch Johnie Flaming A, NP   diclofenac  (VOLTAREN ) 75 MG EC tablet Take 1 tablet (75 mg total) by mouth 2 (two) times daily. 30 tablet Johnie Flaming A, NP      I have reviewed the PDMP during this encounter.    [1]  Social History Tobacco Use   Smoking status: Never   Smokeless tobacco: Never  Substance Use Topics   Alcohol use: Yes   Drug use: Never     Johnie Flaming LABOR, NP 08/04/24 1958  "

## 2024-09-04 ENCOUNTER — Ambulatory Visit: Payer: Federal, State, Local not specified - PPO | Admitting: Neurology

## 2024-09-04 ENCOUNTER — Ambulatory Visit (INDEPENDENT_AMBULATORY_CARE_PROVIDER_SITE_OTHER): Admitting: Internal Medicine

## 2024-10-09 ENCOUNTER — Encounter: Admitting: Pulmonary Disease

## 2024-11-02 ENCOUNTER — Encounter: Admitting: Pulmonary Disease

## 2025-01-01 ENCOUNTER — Ambulatory Visit
